# Patient Record
Sex: Female | Born: 1950
Health system: Southern US, Community
[De-identification: ages and names within clinical notes are randomized; demographics above are authoritative.]

## PROBLEM LIST (undated history)

## (undated) DIAGNOSIS — E559 Vitamin D deficiency, unspecified: Secondary | ICD-10-CM

## (undated) DIAGNOSIS — I82409 Acute embolism and thrombosis of unspecified deep veins of unspecified lower extremity: Secondary | ICD-10-CM

## (undated) DIAGNOSIS — M5030 Other cervical disc degeneration, unspecified cervical region: Secondary | ICD-10-CM

## (undated) DIAGNOSIS — I639 Cerebral infarction, unspecified: Secondary | ICD-10-CM

## (undated) DIAGNOSIS — E221 Hyperprolactinemia: Secondary | ICD-10-CM

## (undated) DIAGNOSIS — E079 Disorder of thyroid, unspecified: Secondary | ICD-10-CM

## (undated) DIAGNOSIS — D869 Sarcoidosis, unspecified: Secondary | ICD-10-CM

## (undated) DIAGNOSIS — E2740 Unspecified adrenocortical insufficiency: Secondary | ICD-10-CM

## (undated) DIAGNOSIS — E230 Hypopituitarism: Secondary | ICD-10-CM

## (undated) DIAGNOSIS — Z789 Other specified health status: Secondary | ICD-10-CM

## (undated) DIAGNOSIS — M51369 Other intervertebral disc degeneration, lumbar region without mention of lumbar back pain or lower extremity pain: Secondary | ICD-10-CM

## (undated) DIAGNOSIS — G8190 Hemiplegia, unspecified affecting unspecified side: Secondary | ICD-10-CM

## (undated) DIAGNOSIS — IMO0001 Reserved for inherently not codable concepts without codable children: Secondary | ICD-10-CM

## (undated) DIAGNOSIS — G4733 Obstructive sleep apnea (adult) (pediatric): Secondary | ICD-10-CM

## (undated) DIAGNOSIS — E7800 Pure hypercholesterolemia, unspecified: Secondary | ICD-10-CM

## (undated) DIAGNOSIS — M5136 Other intervertebral disc degeneration, lumbar region: Secondary | ICD-10-CM

## (undated) HISTORY — PX: NOSE SURGERY: SHX723

## (undated) HISTORY — PX: CHOLECYSTECTOMY: SHX55

---

## 2019-06-03 DIAGNOSIS — E2740 Unspecified adrenocortical insufficiency: Secondary | ICD-10-CM | POA: Diagnosis not present

## 2019-06-03 DIAGNOSIS — M8949 Other hypertrophic osteoarthropathy, multiple sites: Secondary | ICD-10-CM | POA: Diagnosis not present

## 2019-06-03 DIAGNOSIS — D869 Sarcoidosis, unspecified: Secondary | ICD-10-CM | POA: Diagnosis not present

## 2019-06-24 DIAGNOSIS — Z20822 Contact with and (suspected) exposure to covid-19: Secondary | ICD-10-CM | POA: Diagnosis not present

## 2019-06-24 DIAGNOSIS — Z743 Need for continuous supervision: Secondary | ICD-10-CM | POA: Diagnosis not present

## 2019-06-24 DIAGNOSIS — I69320 Aphasia following cerebral infarction: Secondary | ICD-10-CM | POA: Diagnosis not present

## 2019-06-24 DIAGNOSIS — I82409 Acute embolism and thrombosis of unspecified deep veins of unspecified lower extremity: Secondary | ICD-10-CM | POA: Diagnosis not present

## 2019-06-24 DIAGNOSIS — Z9181 History of falling: Secondary | ICD-10-CM | POA: Diagnosis not present

## 2019-06-24 DIAGNOSIS — Z7952 Long term (current) use of systemic steroids: Secondary | ICD-10-CM | POA: Diagnosis not present

## 2019-06-24 DIAGNOSIS — E7849 Other hyperlipidemia: Secondary | ICD-10-CM | POA: Diagnosis not present

## 2019-06-24 DIAGNOSIS — R2681 Unsteadiness on feet: Secondary | ICD-10-CM | POA: Diagnosis not present

## 2019-06-24 DIAGNOSIS — E236 Other disorders of pituitary gland: Secondary | ICD-10-CM | POA: Diagnosis not present

## 2019-06-24 DIAGNOSIS — D689 Coagulation defect, unspecified: Secondary | ICD-10-CM | POA: Diagnosis not present

## 2019-06-24 DIAGNOSIS — Z882 Allergy status to sulfonamides status: Secondary | ICD-10-CM | POA: Diagnosis not present

## 2019-06-24 DIAGNOSIS — E230 Hypopituitarism: Secondary | ICD-10-CM | POA: Diagnosis not present

## 2019-06-24 DIAGNOSIS — D869 Sarcoidosis, unspecified: Secondary | ICD-10-CM | POA: Diagnosis not present

## 2019-06-24 DIAGNOSIS — Z91013 Allergy to seafood: Secondary | ICD-10-CM | POA: Diagnosis not present

## 2019-06-24 DIAGNOSIS — E785 Hyperlipidemia, unspecified: Secondary | ICD-10-CM | POA: Diagnosis not present

## 2019-06-24 DIAGNOSIS — R296 Repeated falls: Secondary | ICD-10-CM | POA: Diagnosis not present

## 2019-06-24 DIAGNOSIS — Z886 Allergy status to analgesic agent status: Secondary | ICD-10-CM | POA: Diagnosis not present

## 2019-06-24 DIAGNOSIS — L929 Granulomatous disorder of the skin and subcutaneous tissue, unspecified: Secondary | ICD-10-CM | POA: Diagnosis not present

## 2019-06-24 DIAGNOSIS — D649 Anemia, unspecified: Secondary | ICD-10-CM | POA: Diagnosis not present

## 2019-06-24 DIAGNOSIS — M5489 Other dorsalgia: Secondary | ICD-10-CM | POA: Diagnosis not present

## 2019-06-24 DIAGNOSIS — R29898 Other symptoms and signs involving the musculoskeletal system: Secondary | ICD-10-CM | POA: Diagnosis not present

## 2019-06-24 DIAGNOSIS — E039 Hypothyroidism, unspecified: Secondary | ICD-10-CM | POA: Diagnosis not present

## 2019-06-24 DIAGNOSIS — M4807 Spinal stenosis, lumbosacral region: Secondary | ICD-10-CM | POA: Diagnosis not present

## 2019-06-24 DIAGNOSIS — E038 Other specified hypothyroidism: Secondary | ICD-10-CM | POA: Diagnosis not present

## 2019-06-24 DIAGNOSIS — M6281 Muscle weakness (generalized): Secondary | ICD-10-CM | POA: Diagnosis not present

## 2019-06-24 DIAGNOSIS — Z86711 Personal history of pulmonary embolism: Secondary | ICD-10-CM | POA: Diagnosis not present

## 2019-06-24 DIAGNOSIS — G4733 Obstructive sleep apnea (adult) (pediatric): Secondary | ICD-10-CM | POA: Diagnosis not present

## 2019-06-24 DIAGNOSIS — I63512 Cerebral infarction due to unspecified occlusion or stenosis of left middle cerebral artery: Secondary | ICD-10-CM | POA: Diagnosis not present

## 2019-06-24 DIAGNOSIS — R627 Adult failure to thrive: Secondary | ICD-10-CM | POA: Diagnosis not present

## 2019-06-24 DIAGNOSIS — Z95828 Presence of other vascular implants and grafts: Secondary | ICD-10-CM | POA: Diagnosis not present

## 2019-06-24 DIAGNOSIS — Z8673 Personal history of transient ischemic attack (TIA), and cerebral infarction without residual deficits: Secondary | ICD-10-CM | POA: Diagnosis not present

## 2019-06-24 DIAGNOSIS — I82891 Chronic embolism and thrombosis of other specified veins: Secondary | ICD-10-CM | POA: Diagnosis not present

## 2019-06-24 DIAGNOSIS — R262 Difficulty in walking, not elsewhere classified: Secondary | ICD-10-CM | POA: Diagnosis not present

## 2019-06-24 DIAGNOSIS — I69351 Hemiplegia and hemiparesis following cerebral infarction affecting right dominant side: Secondary | ICD-10-CM | POA: Diagnosis not present

## 2019-06-24 DIAGNOSIS — D8689 Sarcoidosis of other sites: Secondary | ICD-10-CM | POA: Diagnosis not present

## 2019-06-24 DIAGNOSIS — Z7901 Long term (current) use of anticoagulants: Secondary | ICD-10-CM | POA: Diagnosis not present

## 2019-06-24 DIAGNOSIS — Z531 Procedure and treatment not carried out because of patient's decision for reasons of belief and group pressure: Secondary | ICD-10-CM | POA: Diagnosis not present

## 2019-06-24 DIAGNOSIS — G8929 Other chronic pain: Secondary | ICD-10-CM | POA: Diagnosis not present

## 2019-06-24 DIAGNOSIS — N3090 Cystitis, unspecified without hematuria: Secondary | ICD-10-CM | POA: Diagnosis not present

## 2019-06-24 DIAGNOSIS — M5000 Cervical disc disorder with myelopathy, unspecified cervical region: Secondary | ICD-10-CM | POA: Diagnosis not present

## 2019-06-24 DIAGNOSIS — M5002 Cervical disc disorder with myelopathy, mid-cervical region, unspecified level: Secondary | ICD-10-CM | POA: Diagnosis not present

## 2019-06-24 DIAGNOSIS — Z86718 Personal history of other venous thrombosis and embolism: Secondary | ICD-10-CM | POA: Diagnosis not present

## 2019-06-24 DIAGNOSIS — M8580 Other specified disorders of bone density and structure, unspecified site: Secondary | ICD-10-CM | POA: Diagnosis not present

## 2019-06-24 DIAGNOSIS — E559 Vitamin D deficiency, unspecified: Secondary | ICD-10-CM | POA: Diagnosis not present

## 2019-06-24 DIAGNOSIS — M4316 Spondylolisthesis, lumbar region: Secondary | ICD-10-CM | POA: Diagnosis not present

## 2019-06-24 DIAGNOSIS — R531 Weakness: Secondary | ICD-10-CM | POA: Diagnosis not present

## 2019-06-24 DIAGNOSIS — I69354 Hemiplegia and hemiparesis following cerebral infarction affecting left non-dominant side: Secondary | ICD-10-CM | POA: Diagnosis not present

## 2019-06-24 DIAGNOSIS — G2581 Restless legs syndrome: Secondary | ICD-10-CM | POA: Diagnosis not present

## 2019-06-24 DIAGNOSIS — E2740 Unspecified adrenocortical insufficiency: Secondary | ICD-10-CM | POA: Diagnosis not present

## 2019-06-24 DIAGNOSIS — I671 Cerebral aneurysm, nonruptured: Secondary | ICD-10-CM | POA: Diagnosis not present

## 2019-06-24 DIAGNOSIS — R8281 Pyuria: Secondary | ICD-10-CM | POA: Diagnosis not present

## 2019-06-24 DIAGNOSIS — M79605 Pain in left leg: Secondary | ICD-10-CM | POA: Diagnosis not present

## 2019-06-24 DIAGNOSIS — M48061 Spinal stenosis, lumbar region without neurogenic claudication: Secondary | ICD-10-CM | POA: Diagnosis not present

## 2019-06-24 DIAGNOSIS — N390 Urinary tract infection, site not specified: Secondary | ICD-10-CM | POA: Diagnosis not present

## 2019-06-24 DIAGNOSIS — D509 Iron deficiency anemia, unspecified: Secondary | ICD-10-CM | POA: Diagnosis not present

## 2019-07-03 DIAGNOSIS — E7849 Other hyperlipidemia: Secondary | ICD-10-CM | POA: Diagnosis not present

## 2019-07-03 DIAGNOSIS — I63512 Cerebral infarction due to unspecified occlusion or stenosis of left middle cerebral artery: Secondary | ICD-10-CM | POA: Diagnosis not present

## 2019-07-03 DIAGNOSIS — E230 Hypopituitarism: Secondary | ICD-10-CM | POA: Diagnosis not present

## 2019-07-03 DIAGNOSIS — R29898 Other symptoms and signs involving the musculoskeletal system: Secondary | ICD-10-CM | POA: Diagnosis not present

## 2019-07-03 DIAGNOSIS — R2681 Unsteadiness on feet: Secondary | ICD-10-CM | POA: Diagnosis not present

## 2019-07-03 DIAGNOSIS — R531 Weakness: Secondary | ICD-10-CM | POA: Diagnosis not present

## 2019-07-03 DIAGNOSIS — N390 Urinary tract infection, site not specified: Secondary | ICD-10-CM | POA: Diagnosis not present

## 2019-07-03 DIAGNOSIS — Z8673 Personal history of transient ischemic attack (TIA), and cerebral infarction without residual deficits: Secondary | ICD-10-CM | POA: Diagnosis not present

## 2019-07-03 DIAGNOSIS — E236 Other disorders of pituitary gland: Secondary | ICD-10-CM | POA: Diagnosis not present

## 2019-07-03 DIAGNOSIS — I69354 Hemiplegia and hemiparesis following cerebral infarction affecting left non-dominant side: Secondary | ICD-10-CM | POA: Diagnosis not present

## 2019-07-03 DIAGNOSIS — Z79899 Other long term (current) drug therapy: Secondary | ICD-10-CM | POA: Diagnosis not present

## 2019-07-03 DIAGNOSIS — R319 Hematuria, unspecified: Secondary | ICD-10-CM | POA: Diagnosis not present

## 2019-07-03 DIAGNOSIS — E038 Other specified hypothyroidism: Secondary | ICD-10-CM | POA: Diagnosis not present

## 2019-07-03 DIAGNOSIS — L929 Granulomatous disorder of the skin and subcutaneous tissue, unspecified: Secondary | ICD-10-CM | POA: Diagnosis not present

## 2019-07-03 DIAGNOSIS — M48061 Spinal stenosis, lumbar region without neurogenic claudication: Secondary | ICD-10-CM | POA: Diagnosis not present

## 2019-07-03 DIAGNOSIS — Z531 Procedure and treatment not carried out because of patient's decision for reasons of belief and group pressure: Secondary | ICD-10-CM | POA: Diagnosis not present

## 2019-07-03 DIAGNOSIS — M4807 Spinal stenosis, lumbosacral region: Secondary | ICD-10-CM | POA: Diagnosis not present

## 2019-07-03 DIAGNOSIS — I82891 Chronic embolism and thrombosis of other specified veins: Secondary | ICD-10-CM | POA: Diagnosis not present

## 2019-07-03 DIAGNOSIS — E2740 Unspecified adrenocortical insufficiency: Secondary | ICD-10-CM | POA: Diagnosis not present

## 2019-07-03 DIAGNOSIS — M4316 Spondylolisthesis, lumbar region: Secondary | ICD-10-CM | POA: Diagnosis not present

## 2019-07-03 DIAGNOSIS — Z95828 Presence of other vascular implants and grafts: Secondary | ICD-10-CM | POA: Diagnosis not present

## 2019-07-03 DIAGNOSIS — R262 Difficulty in walking, not elsewhere classified: Secondary | ICD-10-CM | POA: Diagnosis not present

## 2019-07-03 DIAGNOSIS — R8281 Pyuria: Secondary | ICD-10-CM | POA: Diagnosis not present

## 2019-07-03 DIAGNOSIS — M6281 Muscle weakness (generalized): Secondary | ICD-10-CM | POA: Diagnosis not present

## 2019-07-03 DIAGNOSIS — I69320 Aphasia following cerebral infarction: Secondary | ICD-10-CM | POA: Diagnosis not present

## 2019-07-03 DIAGNOSIS — M79605 Pain in left leg: Secondary | ICD-10-CM | POA: Diagnosis not present

## 2019-07-03 DIAGNOSIS — G2581 Restless legs syndrome: Secondary | ICD-10-CM | POA: Diagnosis not present

## 2019-07-03 DIAGNOSIS — D869 Sarcoidosis, unspecified: Secondary | ICD-10-CM | POA: Diagnosis not present

## 2019-07-03 DIAGNOSIS — M5000 Cervical disc disorder with myelopathy, unspecified cervical region: Secondary | ICD-10-CM | POA: Diagnosis not present

## 2019-07-03 DIAGNOSIS — Z9181 History of falling: Secondary | ICD-10-CM | POA: Diagnosis not present

## 2019-07-03 DIAGNOSIS — Z86711 Personal history of pulmonary embolism: Secondary | ICD-10-CM | POA: Diagnosis not present

## 2019-07-03 DIAGNOSIS — D649 Anemia, unspecified: Secondary | ICD-10-CM | POA: Diagnosis not present

## 2019-07-04 DIAGNOSIS — E038 Other specified hypothyroidism: Secondary | ICD-10-CM | POA: Diagnosis not present

## 2019-07-04 DIAGNOSIS — R29898 Other symptoms and signs involving the musculoskeletal system: Secondary | ICD-10-CM | POA: Diagnosis not present

## 2019-07-04 DIAGNOSIS — M4316 Spondylolisthesis, lumbar region: Secondary | ICD-10-CM | POA: Diagnosis not present

## 2019-07-09 DIAGNOSIS — R531 Weakness: Secondary | ICD-10-CM | POA: Diagnosis not present

## 2019-07-09 DIAGNOSIS — I69354 Hemiplegia and hemiparesis following cerebral infarction affecting left non-dominant side: Secondary | ICD-10-CM | POA: Diagnosis not present

## 2019-07-25 DIAGNOSIS — G2581 Restless legs syndrome: Secondary | ICD-10-CM | POA: Diagnosis not present

## 2019-07-25 DIAGNOSIS — I69320 Aphasia following cerebral infarction: Secondary | ICD-10-CM | POA: Diagnosis not present

## 2019-07-25 DIAGNOSIS — R531 Weakness: Secondary | ICD-10-CM | POA: Diagnosis not present

## 2019-07-28 ENCOUNTER — Other Ambulatory Visit: Payer: Self-pay

## 2019-07-28 ENCOUNTER — Emergency Department
Admission: EM | Admit: 2019-07-28 | Discharge: 2019-07-28 | Disposition: A | Discharge status: Home / Self Care | Payer: Medicare Other | Attending: Emergency Medicine | Admitting: Emergency Medicine

## 2019-07-28 ENCOUNTER — Encounter: Payer: Self-pay | Admitting: Emergency Medicine

## 2019-07-28 DIAGNOSIS — E038 Other specified hypothyroidism: Secondary | ICD-10-CM | POA: Diagnosis not present

## 2019-07-28 DIAGNOSIS — M4316 Spondylolisthesis, lumbar region: Secondary | ICD-10-CM | POA: Diagnosis not present

## 2019-07-28 DIAGNOSIS — R300 Dysuria: Secondary | ICD-10-CM | POA: Insufficient documentation

## 2019-07-28 DIAGNOSIS — Z8673 Personal history of transient ischemic attack (TIA), and cerebral infarction without residual deficits: Secondary | ICD-10-CM | POA: Diagnosis not present

## 2019-07-28 DIAGNOSIS — I69351 Hemiplegia and hemiparesis following cerebral infarction affecting right dominant side: Secondary | ICD-10-CM | POA: Diagnosis not present

## 2019-07-28 DIAGNOSIS — M4807 Spinal stenosis, lumbosacral region: Secondary | ICD-10-CM | POA: Diagnosis not present

## 2019-07-28 DIAGNOSIS — R319 Hematuria, unspecified: Secondary | ICD-10-CM | POA: Diagnosis not present

## 2019-07-28 DIAGNOSIS — Z9181 History of falling: Secondary | ICD-10-CM | POA: Diagnosis not present

## 2019-07-28 DIAGNOSIS — R1030 Lower abdominal pain, unspecified: Secondary | ICD-10-CM | POA: Diagnosis present

## 2019-07-28 DIAGNOSIS — N390 Urinary tract infection, site not specified: Secondary | ICD-10-CM | POA: Insufficient documentation

## 2019-07-28 DIAGNOSIS — Z86711 Personal history of pulmonary embolism: Secondary | ICD-10-CM | POA: Diagnosis not present

## 2019-07-28 DIAGNOSIS — D8689 Sarcoidosis of other sites: Secondary | ICD-10-CM | POA: Diagnosis not present

## 2019-07-28 DIAGNOSIS — M5000 Cervical disc disorder with myelopathy, unspecified cervical region: Secondary | ICD-10-CM | POA: Diagnosis not present

## 2019-07-28 DIAGNOSIS — G2581 Restless legs syndrome: Secondary | ICD-10-CM | POA: Diagnosis not present

## 2019-07-28 DIAGNOSIS — Z7901 Long term (current) use of anticoagulants: Secondary | ICD-10-CM | POA: Diagnosis not present

## 2019-07-28 DIAGNOSIS — I69320 Aphasia following cerebral infarction: Secondary | ICD-10-CM | POA: Diagnosis not present

## 2019-07-28 DIAGNOSIS — E785 Hyperlipidemia, unspecified: Secondary | ICD-10-CM | POA: Diagnosis not present

## 2019-07-28 HISTORY — DX: Sarcoidosis, unspecified: D86.9

## 2019-07-28 HISTORY — DX: Vitamin D deficiency, unspecified: E55.9

## 2019-07-28 HISTORY — DX: Cerebral infarction, unspecified: I63.9

## 2019-07-28 HISTORY — DX: Disorder of thyroid, unspecified: E07.9

## 2019-07-28 HISTORY — DX: Obstructive sleep apnea (adult) (pediatric): G47.33

## 2019-07-28 HISTORY — DX: Reserved for inherently not codable concepts without codable children: IMO0001

## 2019-07-28 HISTORY — DX: Hemiplegia, unspecified affecting unspecified side: G81.90

## 2019-07-28 HISTORY — DX: Other intervertebral disc degeneration, lumbar region without mention of lumbar back pain or lower extremity pain: M51.369

## 2019-07-28 HISTORY — DX: Pure hypercholesterolemia, unspecified: E78.00

## 2019-07-28 HISTORY — DX: Hypopituitarism: E23.0

## 2019-07-28 HISTORY — DX: Other cervical disc degeneration, unspecified cervical region: M50.30

## 2019-07-28 HISTORY — DX: Other intervertebral disc degeneration, lumbar region: M51.36

## 2019-07-28 HISTORY — DX: Acute embolism and thrombosis of unspecified deep veins of unspecified lower extremity: I82.409

## 2019-07-28 HISTORY — DX: Other specified health status: Z78.9

## 2019-07-28 HISTORY — DX: Hyperprolactinemia: E22.1

## 2019-07-28 HISTORY — DX: Unspecified adrenocortical insufficiency: E27.40

## 2019-07-28 LAB — CBC
HCT: 41.5 % (ref 36.0–46.0)
Hemoglobin: 13 g/dL (ref 12.0–15.0)
MCH: 27.8 pg (ref 26.0–34.0)
MCHC: 31.3 g/dL (ref 30.0–36.0)
MCV: 88.7 fL (ref 80.0–100.0)
Platelets: 302 10*3/uL (ref 150–400)
RBC: 4.68 MIL/uL (ref 3.87–5.11)
RDW: 15.9 % — ABNORMAL HIGH (ref 11.5–15.5)
WBC: 6.1 10*3/uL (ref 4.0–10.5)
nRBC: 0 % (ref 0.0–0.2)

## 2019-07-28 LAB — COMPREHENSIVE METABOLIC PANEL
ALT: 25 U/L (ref 0–44)
AST: 24 U/L (ref 15–41)
Albumin: 4 g/dL (ref 3.5–5.0)
Alkaline Phosphatase: 80 U/L (ref 38–126)
Anion gap: 9 (ref 5–15)
BUN: 13 mg/dL (ref 8–23)
CO2: 25 mmol/L (ref 22–32)
Calcium: 9 mg/dL (ref 8.9–10.3)
Chloride: 105 mmol/L (ref 98–111)
Creatinine, Ser: 1.1 mg/dL — ABNORMAL HIGH (ref 0.44–1.00)
GFR calc Af Amer: >60 mL/min (ref >60)
GFR calc non Af Amer: 52 mL/min — ABNORMAL LOW (ref >60)
Glucose, Bld: 92 mg/dL (ref 70–99)
Potassium: 3.7 mmol/L (ref 3.5–5.1)
Sodium: 139 mmol/L (ref 135–145)
Total Bilirubin: 0.6 mg/dL (ref 0.3–1.2)
Total Protein: 7 g/dL (ref 6.5–8.1)

## 2019-07-28 LAB — URINALYSIS, COMPLETE (UACMP) WITH MICROSCOPIC
Bilirubin Urine: NEGATIVE
Glucose, UA: NEGATIVE mg/dL
Ketones, ur: NEGATIVE mg/dL
Nitrite: POSITIVE — AB
Protein, ur: 100 mg/dL — AB
RBC / HPF: >50 RBC/hpf (ref 0–5)
Specific Gravity, Urine: 1.015 (ref 1.005–1.030)
WBC, UA: >50 WBC/hpf (ref 0–5)
pH: 5.5 (ref 5.0–8.0)

## 2019-07-28 LAB — LIPASE, BLOOD: Lipase: 25 U/L (ref 11–51)

## 2019-07-28 MED ORDER — SODIUM CHLORIDE 0.9 % IV SOLN
1.0000 g | Freq: Once | INTRAVENOUS | Status: AC
  Administered 2019-07-28: 1 g via INTRAVENOUS
  Filled 2019-07-28: qty 10

## 2019-07-28 MED ORDER — CEFDINIR 300 MG PO CAPS
300.0000 mg | ORAL_CAPSULE | Freq: Two times a day (BID) | ORAL | 0 refills | Status: DC
Start: 2019-07-28 — End: 2019-08-01

## 2019-07-28 NOTE — ED Provider Notes (Signed)
[facility] Emergency Department Provider Note  ____________________________________________   First MD Initiated Contact with Patient 07/28/19 1252     (approximate)  I have reviewed the triage vital signs and the nursing notes.   HISTORY  Chief Complaint Abdominal Pain    HPI Morgan Carpenter is a 69 y.o. female presents to the emergency department with her husband.  She is complaining of lower abdominal pain and burning with urination.  Patient states that she has had UTI symptoms for greater than 5 days.  She recently had a stroke and has been going to rehab.  They did obtain a urine but lost the urine specimen.  He took another specimen yesterday but no results at this time.  She states she started to feel worse and her husband brought her to the ED.  She denies fever but is taking Tylenol.  No vomiting.  No back pain    Past Medical History:  Diagnosis Date  . Adrenal insufficiency (HCC)   . DDD (degenerative disc disease), cervical   . DDD (degenerative disc disease), lumbar   . Hemiplegia (HCC)   . Hypercholesteremia   . Hyperprolactinemia (HCC)   . Hypopituitarism (HCC)   . Lymphocytic hypophysitis (HCC)   . OSA (obstructive sleep apnea)   . Patient is Jehovah's Witness   . Recurrent deep vein thrombosis (DVT) (HCC)   . Sarcoidosis   . Stroke (HCC)   . Thyroid disease   . Vitamin D deficiency     There are no problems to display for this patient.   Past Surgical History:  Procedure Laterality Date  . CHOLECYSTECTOMY    . NOSE SURGERY      Prior to Admission medications   Medication Sig Start Date End Date Taking? Authorizing Provider  cefdinir (OMNICEF) 300 MG capsule Take 1 capsule (300 mg total) by mouth 2 (two) times daily. 07/28/19   Faythe Ghee, PA-C    Allergies Naprosyn [naproxen], Shellfish allergy, and Sulfa antibiotics  History reviewed. No pertinent family history.  Social History Social History   Tobacco Use   . Smoking status: Never Smoker  . Smokeless tobacco: Never Used  Substance Use Topics  . Alcohol use: Never  . Drug use: Never    Review of Systems  Constitutional: No fever/chills Eyes: No visual changes. ENT: No sore throat. Respiratory: Denies cough Cardiovascular: Denies chest pain Gastrointestinal: Positive abdominal pain Genitourinary: Positive for dysuria. Musculoskeletal: Negative for back pain. Skin: Negative for rash. Psychiatric: no mood changes,     ____________________________________________   PHYSICAL EXAM:  VITAL SIGNS: ED Triage Vitals  Enc Vitals Group     BP 07/28/19 1134 116/70     Pulse Rate 07/28/19 1134 90     Resp 07/28/19 1134 18     Temp 07/28/19 1134 98.3 F (36.8 C)     Temp Source 07/28/19 1134 Oral     SpO2 07/28/19 1134 100 %     Weight 07/28/19 1134 175 lb (79.4 kg)     Height 07/28/19 1134 5\' 4"  (1.626 m)     Head Circumference --      Peak Flow --      Pain Score 07/28/19 1133 6     Pain Loc --      Pain Edu? --      Excl. in GC? --     Constitutional: Alert and oriented. Well appearing and in no acute distress. Eyes: Conjunctivae are normal.  Head: Atraumatic. Nose: No  congestion/rhinnorhea. Mouth/Throat: Mucous membranes are moist.   Neck:  supple no lymphadenopathy noted Cardiovascular: Normal rate, regular rhythm. Heart sounds are normal Respiratory: Normal respiratory effort.  No retractions, lungs c t a  Abd: soft mildly tender at the suprapubic bs normal all 4 quad GU: deferred Musculoskeletal: Able to move arms and hands, warm and well perfused Neurologic:  Normal speech and language at baseline for patient Skin:  Skin is warm, dry and intact. No rash noted. Psychiatric: Mood and affect are normal. Speech and behavior are normal.  ____________________________________________   LABS (all labs ordered are listed, but only abnormal results are displayed)  Labs Reviewed  COMPREHENSIVE METABOLIC PANEL -  Abnormal; Notable for the following components:      Result Value   Creatinine, Ser 1.10 (*)    GFR calc non Af Amer 52 (*)    All other components within normal limits  CBC - Abnormal; Notable for the following components:   RDW 15.9 (*)    All other components within normal limits  URINALYSIS, COMPLETE (UACMP) WITH MICROSCOPIC - Abnormal; Notable for the following components:   APPearance CLOUDY (*)    Hgb urine dipstick LARGE (*)    Protein, ur 100 (*)    Nitrite POSITIVE (*)    Leukocytes,Ua LARGE (*)    Bacteria, UA MANY (*)    All other components within normal limits  URINE CULTURE  LIPASE, BLOOD   ____________________________________________   ____________________________________________  RADIOLOGY    ____________________________________________   PROCEDURES  Procedure(s) performed: Saline lock, Rocephin 1 g IV   Procedures    ____________________________________________   INITIAL IMPRESSION / ASSESSMENT AND PLAN / ED COURSE  Pertinent labs & imaging results that were available during my care of the patient were reviewed by me and considered in my medical decision making (see chart for details).   Patient is 69 year old female with history of stroke presents emergency department for UTI symptoms.  See HPI  Physical exam shows patient to appear stable.  Abdomen is minimally tender at the suprapubic area.  Patient appears to be at baseline  DDx: UTI, pyelonephritis, sepsis  CBC is normal, comprehensive metabolic panel is basically normal, UA has large amount hemoglobin, positive nitrates large leuks and many bacteria greater than 50 WBCs and greater than 50 RBCs with white blood cell clumps present.  Lipase is normal urine culture is pending  Labs are reassuring that the patient is not septic or have pyelonephritis.  Feel this is a lower urinary tract infection.  Patient is to follow-up with her regular doctor.  She was given Rocephin 1 g IV while here in  the ED.  Culture was ordered to make sure is not resistant to the medications.  She was given a prescription for Omnicef twice daily for 7 days.  She is to return the hospital if worsening.  I did explain signs and symptoms of sepsis and pyelonephritis to her husband.  He states he understands.  Patient was discharged into his care in stable condition.    Morgan Carpenter was evaluated in Emergency Department on 07/28/2019 for the symptoms described in the history of present illness. She was evaluated in the context of the global COVID-19 pandemic, which necessitated consideration that the patient might be at risk for infection with the SARS-CoV-2 virus that causes COVID-19. Institutional protocols and algorithms that pertain to the evaluation of patients at risk for COVID-19 are in a state of rapid change based on information released by regulatory  bodies including the CDC and federal and state organizations. These policies and algorithms were followed during the patient's care in the ED.   As part of my medical decision making, I reviewed the following data within the electronic MEDICAL RECORD NUMBER History obtained from family, Nursing notes reviewed and incorporated, Labs reviewed , Old chart reviewed, Notes from prior ED visits and [place] Controlled Substance Database  ____________________________________________   FINAL CLINICAL IMPRESSION(S) / ED DIAGNOSES  Final diagnoses:  Urinary tract infection with hematuria, site unspecified      NEW MEDICATIONS STARTED DURING THIS VISIT:  Discharge Medication List as of 07/28/2019  1:42 PM    START taking these medications   Details  cefdinir (OMNICEF) 300 MG capsule Take 1 capsule (300 mg total) by mouth 2 (two) times daily., Starting Sun 07/28/2019, Normal         Note:  This document was prepared using Dragon voice recognition software and may include unintentional dictation errors.    Faythe Ghee, PA-C 07/28/19 1527    Dionne Bucy, MD 07/28/19 1623

## 2019-07-28 NOTE — Discharge Instructions (Addendum)
Follow-up with your regular doctor if not improving in 4 to 5 days.  Return emergency department worsening.  Take medication as prescribed.  We will call you if the culture shows resistant bacteria.

## 2019-07-28 NOTE — ED Notes (Signed)
Pt assisted to toilet in lobby and urine sample sent to lab

## 2019-07-28 NOTE — ED Triage Notes (Signed)
Pt here for lower abdominal burning and burning with urination.  Reports burning in lower abdomen even when not urinating.  She thinks she has a uti.  Pt has difficulty with speech but normal since her stroke per pt.  Denies NVD, fevers. VSS

## 2019-07-29 DIAGNOSIS — R7303 Prediabetes: Secondary | ICD-10-CM | POA: Diagnosis not present

## 2019-07-29 DIAGNOSIS — Z79899 Other long term (current) drug therapy: Secondary | ICD-10-CM | POA: Diagnosis not present

## 2019-07-29 DIAGNOSIS — E2740 Unspecified adrenocortical insufficiency: Secondary | ICD-10-CM | POA: Diagnosis not present

## 2019-07-29 DIAGNOSIS — E230 Hypopituitarism: Secondary | ICD-10-CM | POA: Diagnosis not present

## 2019-07-29 DIAGNOSIS — E039 Hypothyroidism, unspecified: Secondary | ICD-10-CM | POA: Diagnosis not present

## 2019-07-30 DIAGNOSIS — G4733 Obstructive sleep apnea (adult) (pediatric): Secondary | ICD-10-CM | POA: Diagnosis not present

## 2019-07-30 LAB — URINE CULTURE: Culture: >=100000 — AB

## 2019-08-01 ENCOUNTER — Emergency Department: Payer: Medicare Other

## 2019-08-01 ENCOUNTER — Inpatient Hospital Stay
Admission: EM | Admit: 2019-08-01 | Discharge: 2019-08-09 | DRG: 690 | Disposition: A | Discharge status: Skilled Nursing Facility | Payer: Medicare Other | Attending: Internal Medicine | Admitting: Internal Medicine

## 2019-08-01 ENCOUNTER — Other Ambulatory Visit: Payer: Self-pay

## 2019-08-01 DIAGNOSIS — E2749 Other adrenocortical insufficiency: Secondary | ICD-10-CM | POA: Diagnosis not present

## 2019-08-01 DIAGNOSIS — E7800 Pure hypercholesterolemia, unspecified: Secondary | ICD-10-CM | POA: Diagnosis not present

## 2019-08-01 DIAGNOSIS — Z91013 Allergy to seafood: Secondary | ICD-10-CM | POA: Diagnosis not present

## 2019-08-01 DIAGNOSIS — Z95828 Presence of other vascular implants and grafts: Secondary | ICD-10-CM

## 2019-08-01 DIAGNOSIS — R319 Hematuria, unspecified: Secondary | ICD-10-CM | POA: Diagnosis not present

## 2019-08-01 DIAGNOSIS — F329 Major depressive disorder, single episode, unspecified: Secondary | ICD-10-CM | POA: Diagnosis present

## 2019-08-01 DIAGNOSIS — K5900 Constipation, unspecified: Secondary | ICD-10-CM | POA: Diagnosis not present

## 2019-08-01 DIAGNOSIS — Z79899 Other long term (current) drug therapy: Secondary | ICD-10-CM | POA: Diagnosis not present

## 2019-08-01 DIAGNOSIS — Z743 Need for continuous supervision: Secondary | ICD-10-CM | POA: Diagnosis not present

## 2019-08-01 DIAGNOSIS — E559 Vitamin D deficiency, unspecified: Secondary | ICD-10-CM | POA: Diagnosis present

## 2019-08-01 DIAGNOSIS — R7401 Elevation of levels of liver transaminase levels: Secondary | ICD-10-CM | POA: Diagnosis not present

## 2019-08-01 DIAGNOSIS — Y92009 Unspecified place in unspecified non-institutional (private) residence as the place of occurrence of the external cause: Secondary | ICD-10-CM | POA: Diagnosis not present

## 2019-08-01 DIAGNOSIS — Z886 Allergy status to analgesic agent status: Secondary | ICD-10-CM | POA: Diagnosis not present

## 2019-08-01 DIAGNOSIS — E039 Hypothyroidism, unspecified: Secondary | ICD-10-CM | POA: Diagnosis present

## 2019-08-01 DIAGNOSIS — R509 Fever, unspecified: Secondary | ICD-10-CM | POA: Diagnosis not present

## 2019-08-01 DIAGNOSIS — I69359 Hemiplegia and hemiparesis following cerebral infarction affecting unspecified side: Secondary | ICD-10-CM

## 2019-08-01 DIAGNOSIS — Z86711 Personal history of pulmonary embolism: Secondary | ICD-10-CM

## 2019-08-01 DIAGNOSIS — Z86718 Personal history of other venous thrombosis and embolism: Secondary | ICD-10-CM

## 2019-08-01 DIAGNOSIS — Z79890 Hormone replacement therapy (postmenopausal): Secondary | ICD-10-CM

## 2019-08-01 DIAGNOSIS — E079 Disorder of thyroid, unspecified: Secondary | ICD-10-CM | POA: Diagnosis not present

## 2019-08-01 DIAGNOSIS — D8689 Sarcoidosis of other sites: Secondary | ICD-10-CM | POA: Diagnosis present

## 2019-08-01 DIAGNOSIS — W19XXXA Unspecified fall, initial encounter: Secondary | ICD-10-CM | POA: Diagnosis present

## 2019-08-01 DIAGNOSIS — Z20822 Contact with and (suspected) exposure to covid-19: Secondary | ICD-10-CM | POA: Diagnosis not present

## 2019-08-01 DIAGNOSIS — M25562 Pain in left knee: Secondary | ICD-10-CM | POA: Diagnosis not present

## 2019-08-01 DIAGNOSIS — K760 Fatty (change of) liver, not elsewhere classified: Secondary | ICD-10-CM | POA: Diagnosis not present

## 2019-08-01 DIAGNOSIS — G4733 Obstructive sleep apnea (adult) (pediatric): Secondary | ICD-10-CM | POA: Diagnosis not present

## 2019-08-01 DIAGNOSIS — E221 Hyperprolactinemia: Secondary | ICD-10-CM | POA: Diagnosis not present

## 2019-08-01 DIAGNOSIS — G9340 Encephalopathy, unspecified: Secondary | ICD-10-CM | POA: Diagnosis not present

## 2019-08-01 DIAGNOSIS — M48061 Spinal stenosis, lumbar region without neurogenic claudication: Secondary | ICD-10-CM | POA: Diagnosis present

## 2019-08-01 DIAGNOSIS — E230 Hypopituitarism: Secondary | ICD-10-CM | POA: Diagnosis present

## 2019-08-01 DIAGNOSIS — Z882 Allergy status to sulfonamides status: Secondary | ICD-10-CM | POA: Diagnosis not present

## 2019-08-01 DIAGNOSIS — E272 Addisonian crisis: Secondary | ICD-10-CM | POA: Diagnosis not present

## 2019-08-01 DIAGNOSIS — M5030 Other cervical disc degeneration, unspecified cervical region: Secondary | ICD-10-CM | POA: Diagnosis not present

## 2019-08-01 DIAGNOSIS — M170 Bilateral primary osteoarthritis of knee: Secondary | ICD-10-CM | POA: Diagnosis present

## 2019-08-01 DIAGNOSIS — N390 Urinary tract infection, site not specified: Secondary | ICD-10-CM | POA: Diagnosis not present

## 2019-08-01 DIAGNOSIS — G959 Disease of spinal cord, unspecified: Secondary | ICD-10-CM | POA: Diagnosis present

## 2019-08-01 DIAGNOSIS — M5136 Other intervertebral disc degeneration, lumbar region: Secondary | ICD-10-CM | POA: Diagnosis not present

## 2019-08-01 DIAGNOSIS — M25561 Pain in right knee: Secondary | ICD-10-CM

## 2019-08-01 DIAGNOSIS — D869 Sarcoidosis, unspecified: Secondary | ICD-10-CM

## 2019-08-01 DIAGNOSIS — M25569 Pain in unspecified knee: Secondary | ICD-10-CM

## 2019-08-01 DIAGNOSIS — Z0389 Encounter for observation for other suspected diseases and conditions ruled out: Secondary | ICD-10-CM | POA: Diagnosis not present

## 2019-08-01 DIAGNOSIS — R531 Weakness: Secondary | ICD-10-CM | POA: Diagnosis not present

## 2019-08-01 DIAGNOSIS — N3000 Acute cystitis without hematuria: Secondary | ICD-10-CM | POA: Diagnosis not present

## 2019-08-01 DIAGNOSIS — M1710 Unilateral primary osteoarthritis, unspecified knee: Secondary | ICD-10-CM | POA: Diagnosis present

## 2019-08-01 DIAGNOSIS — R52 Pain, unspecified: Secondary | ICD-10-CM | POA: Diagnosis not present

## 2019-08-01 DIAGNOSIS — M2550 Pain in unspecified joint: Secondary | ICD-10-CM | POA: Diagnosis not present

## 2019-08-01 DIAGNOSIS — M4716 Other spondylosis with myelopathy, lumbar region: Secondary | ICD-10-CM | POA: Diagnosis present

## 2019-08-01 DIAGNOSIS — R5381 Other malaise: Secondary | ICD-10-CM | POA: Diagnosis not present

## 2019-08-01 DIAGNOSIS — M5127 Other intervertebral disc displacement, lumbosacral region: Secondary | ICD-10-CM | POA: Diagnosis not present

## 2019-08-01 DIAGNOSIS — M179 Osteoarthritis of knee, unspecified: Secondary | ICD-10-CM | POA: Diagnosis present

## 2019-08-01 DIAGNOSIS — I82409 Acute embolism and thrombosis of unspecified deep veins of unspecified lower extremity: Secondary | ICD-10-CM

## 2019-08-01 DIAGNOSIS — Z7401 Bed confinement status: Secondary | ICD-10-CM | POA: Diagnosis not present

## 2019-08-01 DIAGNOSIS — A4181 Sepsis due to Enterococcus: Secondary | ICD-10-CM | POA: Diagnosis not present

## 2019-08-01 LAB — URINALYSIS, COMPLETE (UACMP) WITH MICROSCOPIC
RBC / HPF: >50 RBC/hpf (ref 0–5)
Specific Gravity, Urine: 1.03 (ref 1.005–1.030)
WBC, UA: >50 WBC/hpf (ref 0–5)

## 2019-08-01 LAB — COMPREHENSIVE METABOLIC PANEL
ALT: 77 U/L — ABNORMAL HIGH (ref 0–44)
AST: 76 U/L — ABNORMAL HIGH (ref 15–41)
Albumin: 3.7 g/dL (ref 3.5–5.0)
Alkaline Phosphatase: 75 U/L (ref 38–126)
Anion gap: 9 (ref 5–15)
BUN: 11 mg/dL (ref 8–23)
CO2: 22 mmol/L (ref 22–32)
Calcium: 8.8 mg/dL — ABNORMAL LOW (ref 8.9–10.3)
Chloride: 106 mmol/L (ref 98–111)
Creatinine, Ser: 0.99 mg/dL (ref 0.44–1.00)
GFR calc Af Amer: >60 mL/min (ref >60)
GFR calc non Af Amer: 59 mL/min — ABNORMAL LOW (ref >60)
Glucose, Bld: 101 mg/dL — ABNORMAL HIGH (ref 70–99)
Potassium: 3.6 mmol/L (ref 3.5–5.1)
Sodium: 137 mmol/L (ref 135–145)
Total Bilirubin: 0.6 mg/dL (ref 0.3–1.2)
Total Protein: 6.7 g/dL (ref 6.5–8.1)

## 2019-08-01 LAB — CBC
HCT: 39.7 % (ref 36.0–46.0)
Hemoglobin: 12.5 g/dL (ref 12.0–15.0)
MCH: 27.4 pg (ref 26.0–34.0)
MCHC: 31.5 g/dL (ref 30.0–36.0)
MCV: 86.9 fL (ref 80.0–100.0)
Platelets: 287 10*3/uL (ref 150–400)
RBC: 4.57 MIL/uL (ref 3.87–5.11)
RDW: 15.6 % — ABNORMAL HIGH (ref 11.5–15.5)
WBC: 4.6 10*3/uL (ref 4.0–10.5)
nRBC: 0 % (ref 0.0–0.2)

## 2019-08-01 LAB — RESPIRATORY PANEL BY RT PCR (FLU A&B, COVID)
Influenza A by PCR: NEGATIVE
Influenza B by PCR: NEGATIVE
SARS Coronavirus 2 by RT PCR: NEGATIVE

## 2019-08-01 LAB — SEDIMENTATION RATE: Sed Rate: 26 mm/hr (ref 0–30)

## 2019-08-01 LAB — HIV ANTIBODY (ROUTINE TESTING W REFLEX): HIV Screen 4th Generation wRfx: NONREACTIVE

## 2019-08-01 LAB — CK: Total CK: 238 U/L — ABNORMAL HIGH (ref 38–234)

## 2019-08-01 MED ORDER — LEVOTHYROXINE SODIUM 50 MCG PO TABS
75.0000 ug | ORAL_TABLET | Freq: Every day | ORAL | Status: DC
  Administered 2019-08-02 – 2019-08-09 (×8): 75 ug via ORAL
  Filled 2019-08-01 (×9): qty 1

## 2019-08-01 MED ORDER — MORPHINE SULFATE (PF) 2 MG/ML IV SOLN
2.0000 mg | Freq: Once | INTRAVENOUS | Status: AC
  Administered 2019-08-01: 2 mg via INTRAVENOUS
  Filled 2019-08-01: qty 1

## 2019-08-01 MED ORDER — SODIUM CHLORIDE 0.9 % IV SOLN: INTRAVENOUS | Status: DC

## 2019-08-01 MED ORDER — ACETAMINOPHEN 650 MG RE SUPP: 650.0000 mg | Freq: Four times a day (QID) | RECTAL | Status: DC | PRN

## 2019-08-01 MED ORDER — ONDANSETRON HCL 4 MG/2ML IJ SOLN
4.0000 mg | Freq: Once | INTRAMUSCULAR | Status: AC
  Administered 2019-08-01: 4 mg via INTRAVENOUS
  Filled 2019-08-01: qty 2

## 2019-08-01 MED ORDER — ACETAMINOPHEN 325 MG PO TABS
650.0000 mg | ORAL_TABLET | Freq: Four times a day (QID) | ORAL | Status: DC | PRN
  Administered 2019-08-01 – 2019-08-02 (×2): 650 mg via ORAL
  Filled 2019-08-01 (×3): qty 2

## 2019-08-01 MED ORDER — ONDANSETRON HCL 4 MG/2ML IJ SOLN
4.0000 mg | Freq: Four times a day (QID) | INTRAMUSCULAR | Status: DC | PRN
  Administered 2019-08-06 – 2019-08-07 (×2): 4 mg via INTRAVENOUS
  Filled 2019-08-01 (×2): qty 2

## 2019-08-01 MED ORDER — GABAPENTIN 300 MG PO CAPS
300.0000 mg | ORAL_CAPSULE | Freq: Every day | ORAL | Status: DC
  Filled 2019-08-01 (×6): qty 1

## 2019-08-01 MED ORDER — SENNOSIDES-DOCUSATE SODIUM 8.6-50 MG PO TABS
2.0000 | ORAL_TABLET | Freq: Every evening | ORAL | Status: DC | PRN
  Administered 2019-08-07: 01:00:00 2 via ORAL
  Filled 2019-08-01: qty 2

## 2019-08-01 MED ORDER — ENOXAPARIN SODIUM 40 MG/0.4ML ~~LOC~~ SOLN
40.0000 mg | SUBCUTANEOUS | Status: DC
Start: 2019-08-01 — End: 2019-08-01

## 2019-08-01 MED ORDER — TIZANIDINE HCL 2 MG PO TABS
2.0000 mg | ORAL_TABLET | Freq: Every evening | ORAL | Status: DC | PRN
  Filled 2019-08-01: qty 1

## 2019-08-01 MED ORDER — FLUOXETINE HCL 10 MG PO CAPS
10.0000 mg | ORAL_CAPSULE | Freq: Every day | ORAL | Status: DC
  Administered 2019-08-01 – 2019-08-09 (×9): 10 mg via ORAL
  Filled 2019-08-01 (×9): qty 1

## 2019-08-01 MED ORDER — ADULT MULTIVITAMIN W/MINERALS CH
1.0000 | ORAL_TABLET | Freq: Every day | ORAL | Status: DC
  Administered 2019-08-01 – 2019-08-09 (×9): 1 via ORAL
  Filled 2019-08-01 (×9): qty 1

## 2019-08-01 MED ORDER — SODIUM CHLORIDE 0.9 % IV SOLN
1.0000 g | INTRAVENOUS | Status: DC
  Administered 2019-08-02 – 2019-08-06 (×5): 1 g via INTRAVENOUS
  Filled 2019-08-01 (×3): qty 1
  Filled 2019-08-01: qty 10
  Filled 2019-08-01 (×2): qty 1

## 2019-08-01 MED ORDER — MORPHINE SULFATE (PF) 2 MG/ML IV SOLN
1.0000 mg | Freq: Once | INTRAVENOUS | Status: AC
  Administered 2019-08-01: 1 mg via INTRAVENOUS
  Filled 2019-08-01: qty 1

## 2019-08-01 MED ORDER — MELATONIN 5 MG PO TABS
2.5000 mg | ORAL_TABLET | Freq: Every day | ORAL | Status: DC
  Administered 2019-08-01 – 2019-08-08 (×8): 2.5 mg via ORAL
  Filled 2019-08-01 (×3): qty 1
  Filled 2019-08-01: qty 0.5
  Filled 2019-08-01 (×6): qty 1

## 2019-08-01 MED ORDER — ONDANSETRON HCL 4 MG PO TABS: 4.0000 mg | ORAL_TABLET | Freq: Four times a day (QID) | ORAL | Status: DC | PRN

## 2019-08-01 MED ORDER — SODIUM CHLORIDE 0.9 % IV SOLN
1.0000 g | Freq: Once | INTRAVENOUS | Status: AC
  Administered 2019-08-01: 11:00:00 1 g via INTRAVENOUS
  Filled 2019-08-01: qty 10

## 2019-08-01 MED ORDER — ATORVASTATIN CALCIUM 20 MG PO TABS
40.0000 mg | ORAL_TABLET | Freq: Every day | ORAL | Status: DC
  Administered 2019-08-01: 13:00:00 40 mg via ORAL
  Filled 2019-08-01: qty 2

## 2019-08-01 NOTE — ED Provider Notes (Addendum)
[facility] Emergency Department Provider Note   ____________________________________________    I have reviewed the triage vital signs and the nursing notes.   HISTORY  Chief Complaint Knee pain    HPI Morgan Carpenter is a 69 y.o. female with complex past medical history including neurosarcoidosis who presents with complaints of bilateral knee pain and inability to walk.  She states pain started over the last 1 to 2 days with mild swelling in both knees but no redness or fevers.  Does have pain in the backs of the knees as well.  Patient recently seen in our emergency department treated for urinary tract infection with [facility], she reports her symptoms have improved somewhat.  However was just discharged from [place].  In reviewing records patient had admission at [facility] for leg weakness, had extensive evaluation by neurology and neurosurgery and was felt to have weakness in the legs related to urinary tract infection at that time.  The patient apparently has been unable to walk for greater than 6 months at this point although per patient it is somewhat difficult to ascertain that  Past Medical History:  Diagnosis Date  . Adrenal insufficiency ([place])   . DDD (degenerative disc disease), cervical   . DDD (degenerative disc disease), lumbar   . Hemiplegia ([place])   . Hypercholesteremia   . Hyperprolactinemia ([place])   . Hypopituitarism ([place])   . Lymphocytic hypophysitis ([place])   . OSA (obstructive sleep apnea)   . Patient is Jehovah's Witness   . Recurrent deep vein thrombosis (DVT) ([place])   . Sarcoidosis   . Stroke ([place])   . Thyroid disease   . Vitamin D deficiency     There are no problems to display for this patient.   Past Surgical History:  Procedure Laterality Date  . CHOLECYSTECTOMY    . NOSE SURGERY      Prior to Admission medications   Medication Sig Start Date End Date Taking? Authorizing Provider  cefdinir (OMNICEF) 300 MG  capsule Take 1 capsule (300 mg total) by mouth 2 (two) times daily. 07/28/19   Versie Starks, PA-C     Allergies Naprosyn [naproxen], Shellfish allergy, and Sulfa antibiotics  History reviewed. No pertinent family history.  Social History Social History   Tobacco Use  . Smoking status: Never Smoker  . Smokeless tobacco: Never Used  Substance Use Topics  . Alcohol use: Never  . Drug use: Never    Review of Systems  Constitutional: No fever/chills Eyes: No visual changes.  ENT: No sore throat. Cardiovascular: Denies chest pain. Respiratory: Denies shortness of breath. Gastrointestinal: No abdominal pain.   Genitourinary: Mild dysuria Musculoskeletal: Bilateral knee pain Skin: Negative for rash. Neurological: Negative for headaches or weakness   ____________________________________________   PHYSICAL EXAM:  VITAL SIGNS: ED Triage Vitals  Enc Vitals Group     BP 08/01/19 0749 113/81     Pulse Rate 08/01/19 0749 95     Resp 08/01/19 0749 18     Temp 08/01/19 0749 98.3 F (36.8 C)     Temp Source 08/01/19 0749 Oral     SpO2 08/01/19 0749 97 %     Weight 08/01/19 0746 83.5 kg (184 lb)     Height 08/01/19 0746 1.626 m (5\' 4" )     Head Circumference --      Peak Flow --      Pain Score 08/01/19 0746 8     Pain Loc --  Pain Edu? --      Excl. in GC? --     Constitutional: Alert and oriented.  Mild slurred speech Eyes: Conjunctivae are normal.  Head: Atraumatic. Nose: No congestion/rhinnorhea. Mouth/Throat: Mucous membranes are moist.   Neck:  Painless ROM Cardiovascular: Normal rate, regular rhythm. Grossly normal heart sounds.  Good peripheral circulation. Respiratory: Normal respiratory effort.  No retractions. Lungs CTAB. Gastrointestinal: Soft and nontender. No distention.  No CVA tenderness.  Musculoskeletal: Knees: Mild tenderness to palpation however minimal swelling, no palpable effusion, no erythema. Neurologic: Mildly slurred speech, chronic.Marland Kitchen  No new gross focal neurologic deficits are appreciated.  Skin:  Skin is warm, dry and intact. No rash noted. Psychiatric: Mood and affect are normal. Speech and behavior are normal.  ____________________________________________   LABS (all labs ordered are listed, but only abnormal results are displayed)  Labs Reviewed  CBC - Abnormal; Notable for the following components:      Result Value   RDW 15.6 (*)    All other components within normal limits  COMPREHENSIVE METABOLIC PANEL - Abnormal; Notable for the following components:   Glucose, Bld 101 (*)    Calcium 8.8 (*)    AST 76 (*)    ALT 77 (*)    GFR calc non Af Amer 59 (*)    All other components within normal limits  CK - Abnormal; Notable for the following components:   Total CK 238 (*)    All other components within normal limits  URINALYSIS, COMPLETE (UACMP) WITH MICROSCOPIC - Abnormal; Notable for the following components:   Color, Urine RED (*)    APPearance TURBID (*)    Glucose, UA   (*)    Value: TEST NOT REPORTED DUE TO COLOR INTERFERENCE OF URINE PIGMENT   Hgb urine dipstick   (*)    Value: TEST NOT REPORTED DUE TO COLOR INTERFERENCE OF URINE PIGMENT   Bilirubin Urine   (*)    Value: TEST NOT REPORTED DUE TO COLOR INTERFERENCE OF URINE PIGMENT   Ketones, ur   (*)    Value: TEST NOT REPORTED DUE TO COLOR INTERFERENCE OF URINE PIGMENT   Protein, ur   (*)    Value: TEST NOT REPORTED DUE TO COLOR INTERFERENCE OF URINE PIGMENT   Nitrite   (*)    Value: TEST NOT REPORTED DUE TO COLOR INTERFERENCE OF URINE PIGMENT   Leukocytes,Ua   (*)    Value: TEST NOT REPORTED DUE TO COLOR INTERFERENCE OF URINE PIGMENT   Bacteria, UA FEW (*)    All other components within normal limits  RESPIRATORY PANEL BY RT PCR (FLU A&B, COVID)  SEDIMENTATION RATE   ____________________________________________  EKG  None ____________________________________________  RADIOLOGY  Ultrasound negative for DVTs Chest x-ray reviewed by  me, no pneumonia ____________________________________________   PROCEDURES  Procedure(s) performed: No  Procedures   Critical Care performed: No ____________________________________________   INITIAL IMPRESSION / ASSESSMENT AND PLAN / ED COURSE  Pertinent labs & imaging results that were available during my care of the patient were reviewed by me and considered in my medical decision making (see chart for details).  Patient presents with knee pain bilaterally.  Overall patient has minimal swelling bilaterally, no erythema she reports painful but has not taken anything except Tylenol for this.  Not consistent with septic arthritis given bilateral nature and exam.  Normal white blood cell count.  Afebrile.  Patient does report a history of arthritis which may be the cause.  We will give  low-dose IV morphine and IV Zofran while we await further tests.  Mild elevation in CK noted  Urinalysis with significant hematuria, suspect worsening of urinary tract infection complicated by blood thinners.  We will give IV Rocephin  Have discussed with the hospitalist for admission    ____________________________________________   FINAL CLINICAL IMPRESSION(S) / ED DIAGNOSES  Final diagnoses:  Lower urinary tract infectious disease  Weakness  Pain in both knees, unspecified chronicity  Sarcoidosis        Note:  This document was prepared using Dragon voice recognition software and may include unintentional dictation errors.   Jene Every, MD 08/01/19 1106    Jene Every, MD 08/01/19 [phone]

## 2019-08-01 NOTE — ED Notes (Signed)
Blue, grey, light green and lavender tubes sent to lab

## 2019-08-01 NOTE — ED Notes (Signed)
Message sent to pharmacy to verify prozac order

## 2019-08-01 NOTE — ED Notes (Signed)
Ready bed @ 1305, patient going to room 127. Spoke with Engineer, building services.

## 2019-08-01 NOTE — Evaluation (Signed)
Physical Therapy Evaluation Patient Details Name: ARIAN MCQUITTY MRN: [id number] DOB: March 03, 1951 Today's Date: 08/01/2019   History of Present Illness  Per MD notes: Pt is a 69 y.o. female with medical history significant for neurosarcoidosis, adrenal insufficiency, degenerative disc disease involving the lumbar and cervical spine, osteoarthritis, history of DVT, hypopituitarism and obstructive sleep apnea who presents to the emergency room for evaluation of bilateral knee pain and inability to walk.  Pt recently dicharged from [facility] where she spent 3 weeks in rehab.  MD assessment includes: Complicated UTI with hematuria, hypothyroidism, sarcoidosis, and transaminitis.    Clinical Impression  Pt pleasant and motivated to participate during the session but ultimately was limited by functional weakness and B knee pain.  Pt required extensive assist with bed mobility tasks and was unable to stand secondary to knee pain and poor bilateral knee ext ROM.  Pt presents with significant functional deficits compared to her stated baseline and would not be safe to return to her prior living situation at this time.  Pt will benefit from PT services in a SNF setting upon discharge to safely address deficits listed in patient problem list for decreased caregiver assistance and eventual return to PLOF.      Follow Up Recommendations SNF    Equipment Recommendations  None recommended by PT    Recommendations for Other Services       Precautions / Restrictions Precautions Precautions: Fall Restrictions Weight Bearing Restrictions: No      Mobility  Bed Mobility Overal bed mobility: Needs Assistance Bed Mobility: Supine to Sit;Sit to Supine     Supine to sit: Mod assist Sit to supine: Max assist;+2 for physical assistance   General bed mobility comments: Mod-max A for BLE and trunk control  Transfers                 General transfer comment: Unable secondary to knee pain  and limited ext ROM  Ambulation/Gait             General Gait Details: Unable/unsafe to attempt  Stairs            Wheelchair Mobility    Modified Rankin (Stroke Patients Only)       Balance Overall balance assessment: Needs assistance Sitting-balance support: Bilateral upper extremity supported Sitting balance-Leahy Scale: Good                                       Pertinent Vitals/Pain Pain Assessment: 0-10 Pain Score: 10-Worst pain ever Pain Location: Bil knees, L>R Pain Descriptors / Indicators: Aching;Sore Pain Intervention(s): Premedicated before session;Monitored during session;Limited activity within patient's tolerance    Home Living Family/patient expects to be discharged to:: Private residence Living Arrangements: Spouse/significant other Available Help at Discharge: Family;Other (Comment)(Spouse with limited ability to assist physical secondary to back problems) Type of Home: House Home Access: Level entry     Home Layout: One level Home Equipment: Walker - 2 wheels;Walker - 4 wheels      Prior Function Level of Independence: Independent with assistive device(s)         Comments: Mod Ind with amb limitd community distances with a RW, no fall history, Ind with ADLs     Hand Dominance        Extremity/Trunk Assessment   Upper Extremity Assessment Upper Extremity Assessment: Generalized weakness    Lower Extremity Assessment Lower Extremity Assessment: Generalized  weakness;RLE deficits/detail;LLE deficits/detail RLE Deficits / Details: R knee ext PROM lacking grossly 30 deg, limited by pain RLE: Unable to fully assess due to pain LLE Deficits / Details: L knee ext PROM lacking grossly 30 deg, limited by pain LLE: Unable to fully assess due to pain       Communication   Communication: No difficulties  Cognition Arousal/Alertness: Awake/alert Behavior During Therapy: WFL for tasks assessed/performed Overall  Cognitive Status: Within Functional Limits for tasks assessed                                        General Comments      Exercises Total Joint Exercises Ankle Circles/Pumps: AROM;Both;5 reps;10 reps Quad Sets: AROM;Strengthening;Both;5 reps;10 reps Heel Slides: AAROM;Both;10 reps;5 reps Hip ABduction/ADduction: AAROM;Both;5 reps Long Arc Quad: AROM;AAROM;Both;5 reps;10 reps Knee Flexion: AROM;AAROM;Both;5 reps;10 reps Other Exercises Other Exercises: Gentle, low amplitude B knee ext PROM to patient's tolerance   Assessment/Plan    PT Assessment Patient needs continued PT services  PT Problem List Decreased strength;Decreased range of motion;Decreased activity tolerance;Decreased balance;Decreased mobility;Decreased knowledge of use of DME;Pain       PT Treatment Interventions      PT Goals (Current goals can be found in the Care Plan section)  Acute Rehab PT Goals Patient Stated Goal: Walk without knee pain PT Goal Formulation: With patient Time For Goal Achievement: 08/14/19 Potential to Achieve Goals: Fair    Frequency Min 2X/week   Barriers to discharge Inaccessible home environment;Decreased caregiver support      Co-evaluation               AM-PAC PT "6 Clicks" Mobility  Outcome Measure Help needed turning from your back to your side while in a flat bed without using bedrails?: A Little Help needed moving from lying on your back to sitting on the side of a flat bed without using bedrails?: A Lot Help needed moving to and from a bed to a chair (including a wheelchair)?: Total Help needed standing up from a chair using your arms (e.g., wheelchair or bedside chair)?: Total Help needed to walk in hospital room?: Total Help needed climbing 3-5 steps with a railing? : Total 6 Click Score: 9    End of Session   Activity Tolerance: Patient limited by pain Patient left: in bed;with call bell/phone within reach;with bed alarm set;with  nursing/sitter in room Nurse Communication: Mobility status PT Visit Diagnosis: Muscle weakness (generalized) (M62.81);Pain Pain - Right/Left: Left(bilateral) Pain - part of body: Knee    Time: 4403-4742 PT Time Calculation (min) (ACUTE ONLY): 22 min   Charges:   PT Evaluation $PT Eval Moderate Complexity: 1 Mod PT Treatments $Therapeutic Exercise: 8-22 mins        D. Elly Modena PT, DPT 08/01/19, 5:33 PM

## 2019-08-01 NOTE — ED Notes (Signed)
Report given to Candace, RN

## 2019-08-01 NOTE — H&P (Signed)
History and Physical    Morgan Carpenter JHE:[id number] DOB: [birth year] DOA: 08/01/2019  PCP: Su Monks, MD   Patient coming from: Home  I have personally briefly reviewed patient's old medical records in [place]  Chief Complaint: Dysuria                                Lower extremity weakness  HPI: Morgan Carpenter is a 69 y.o. female with medical history significant for neurosarcoidosis, adrenal insufficiency, degenerative disc disease involving the lumbar and cervical spine, osteoarthritis, history of DVT, hypopituitarism and obstructive sleep apnea who presents to the emergency room for evaluation of bilateral knee pain and inability to walk.  Patient stated that pain in both knees started about 1 to 2 days prior to admission associated with swelling but no redness or fever.  Patient was discharged from subacute rehab on Friday, April 23 where she spent 3 weeks.  According to her husband she was able to get around with her rolling walker on Friday and Saturday but since then has been back bound due to pain in her knees and calf.  He had called EMS over the weekend because the patient fell and was unable to get up, they assisted patient into her bed.  She was seen in the emergency room over the weekend and at that time had pyuria and was discharged home on Omnicef.  Her husband states that she has been taking the antibiotic as recommended and developed diarrhea related to same.  Patient states despite taking antibiotic therapy she continues to have dysuria and frequency.  ED Course: Patient presented for evaluation of bilateral knee pain but noted to have significant hematuria.  Urine culture yields Citrobacter Freundii.  Patient received a dose of Rocephin 1g IV  Review of Systems: As per HPI otherwise 10 point review of systems negative.    Past Medical History:  Diagnosis Date  . Adrenal insufficiency ([place])   . DDD (degenerative disc disease), cervical   . DDD (degenerative disc  disease), lumbar   . Hemiplegia ([place])   . Hypercholesteremia   . Hyperprolactinemia ([place])   . Hypopituitarism ([place])   . Lymphocytic hypophysitis ([place])   . OSA (obstructive sleep apnea)   . Patient is Jehovah's Witness   . Recurrent deep vein thrombosis (DVT) ([place])   . Sarcoidosis   . Stroke ([place])   . Thyroid disease   . Vitamin D deficiency     Past Surgical History:  Procedure Laterality Date  . CHOLECYSTECTOMY    . NOSE SURGERY       reports that she has never smoked. She has never used smokeless tobacco. She reports that she does not drink alcohol or use drugs.  Allergies  Allergen Reactions  . Naprosyn [Naproxen] Hives  . Shellfish Allergy Hives  . Sulfa Antibiotics Hives    History reviewed. No pertinent family history.   Prior to Admission medications   Medication Sig Start Date End Date Taking? Authorizing Provider  melatonin 3 MG TABS tablet Take by mouth. 07/03/19  Yes [provider]  senna-docusate (SENOKOT-S) 8.6-50 MG tablet Take by mouth. 01/07/15  Yes [provider]  atorvastatin (LIPITOR) 40 MG tablet Take 40 mg by mouth daily. 07/21/19   [provider]  cefdinir (OMNICEF) 300 MG capsule Take 1 capsule (300 mg total) by mouth 2 (two) times daily. 07/28/19   Caryn Section Linden Dolin, PA-C  FLUoxetine (  PROZAC) 10 MG capsule Take 10 mg by mouth daily. 07/13/19   [provider]  gabapentin (NEURONTIN) 300 MG capsule PLEASE SEE ATTACHED FOR DETAILED DIRECTIONS 06/14/19   [provider]  levothyroxine (SYNTHROID) 75 MCG tablet PLEASE SEE ATTACHED FOR DETAILED DIRECTIONS 06/18/19   [provider]  Multiple Vitamin (MULTIVITAMIN) capsule Take by mouth.    [provider]  tiZANidine (ZANAFLEX) 2 MG tablet Take 2 mg by mouth at bedtime as needed. 06/24/19   [provider]    Physical Exam: Vitals:   08/01/19 0749 08/01/19 0930 08/01/19 1000 08/01/19 1100  BP: 113/81  128/78 128/72  Pulse: 95 90 83 81   Resp: 18     Temp: 98.3 F (36.8 C)     TempSrc: Oral     SpO2: 97% 96% 95% 98%  Weight:      Height:         Vitals:   08/01/19 0749 08/01/19 0930 08/01/19 1000 08/01/19 1100  BP: 113/81  128/78 128/72  Pulse: 95 90 83 81  Resp: 18     Temp: 98.3 F (36.8 C)     TempSrc: Oral     SpO2: 97% 96% 95% 98%  Weight:      Height:        Constitutional: NAD, alert and oriented x 3.  Flat affect, obese Eyes: PERRL, lids and conjunctivae normal ENMT: Mucous membranes are moist.  Neck: normal, supple, no masses, no thyromegaly Respiratory: clear to auscultation bilaterally, no wheezing, no crackles. Normal respiratory effort. No accessory muscle use.  Cardiovascular: Regular rate and rhythm, no murmurs / rubs / gallops. No extremity edema. 2+ pedal pulses. No carotid bruits.  Abdomen: no tenderness, no masses palpated. No hepatosplenomegaly. Bowel sounds positive.  Central adiposity Musculoskeletal: no clubbing / cyanosis.  Bilateral knee swelling, no redness Skin: no rashes, lesions, ulcers.  Neurologic: No gross focal neurologic deficit. Psychiatric: Normal mood and affect.   Labs on Admission: I have personally reviewed following labs and imaging studies  CBC: Recent Labs  Lab 07/28/19 1139 08/01/19 0759  WBC 6.1 4.6  HGB 13.0 12.5  HCT 41.5 39.7  MCV 88.7 86.9  PLT 302 287   Basic Metabolic Panel: Recent Labs  Lab 07/28/19 1139 08/01/19 0759  NA 139 137  K 3.7 3.6  CL 105 106  CO2 25 22  GLUCOSE 92 101*  BUN 13 11  CREATININE 1.10* 0.99  CALCIUM 9.0 8.8*   GFR: Estimated Creatinine Clearance: 58.4 mL/min (by C-G formula based on SCr of 0.99 mg/dL). Liver Function Tests: Recent Labs  Lab 07/28/19 1139 08/01/19 0759  AST 24 76*  ALT 25 77*  ALKPHOS 80 75  BILITOT 0.6 0.6  PROT 7.0 6.7  ALBUMIN 4.0 3.7   Recent Labs  Lab 07/28/19 1139  LIPASE 25   No results for input(s): AMMONIA in the last 168 hours. Coagulation Profile: No results for  input(s): INR, PROTIME in the last 168 hours. Cardiac Enzymes: Recent Labs  Lab 08/01/19 0759  CKTOTAL 238*   BNP (last 3 results) No results for input(s): PROBNP in the last 8760 hours. HbA1C: No results for input(s): HGBA1C in the last 72 hours. CBG: No results for input(s): GLUCAP in the last 168 hours. Lipid Profile: No results for input(s): CHOL, HDL, LDLCALC, TRIG, CHOLHDL, LDLDIRECT in the last 72 hours. Thyroid Function Tests: No results for input(s): TSH, T4TOTAL, FREET4, T3FREE, THYROIDAB in the last 72 hours. Anemia Panel: No results for  input(s): VITAMINB12, FOLATE, FERRITIN, TIBC, IRON, RETICCTPCT in the last 72 hours. Urine analysis:    Component Value Date/Time   COLORURINE RED (A) 08/01/2019 0829   APPEARANCEUR TURBID (A) 08/01/2019 0829   LABSPEC 1.030 08/01/2019 0829   PHURINE  08/01/2019 0829    TEST NOT REPORTED DUE TO COLOR INTERFERENCE OF URINE PIGMENT   GLUCOSEU (A) 08/01/2019 0829    TEST NOT REPORTED DUE TO COLOR INTERFERENCE OF URINE PIGMENT   HGBUR (A) 08/01/2019 0829    TEST NOT REPORTED DUE TO COLOR INTERFERENCE OF URINE PIGMENT   BILIRUBINUR (A) 08/01/2019 0829    TEST NOT REPORTED DUE TO COLOR INTERFERENCE OF URINE PIGMENT   KETONESUR (A) 08/01/2019 0829    TEST NOT REPORTED DUE TO COLOR INTERFERENCE OF URINE PIGMENT   PROTEINUR (A) 08/01/2019 0829    TEST NOT REPORTED DUE TO COLOR INTERFERENCE OF URINE PIGMENT   NITRITE (A) 08/01/2019 0829    TEST NOT REPORTED DUE TO COLOR INTERFERENCE OF URINE PIGMENT   LEUKOCYTESUR (A) 08/01/2019 0829    TEST NOT REPORTED DUE TO COLOR INTERFERENCE OF URINE PIGMENT    Radiological Exams on Admission: US Venous Img Lower Bilateral  Result Date: 08/01/2019 CLINICAL DATA:  Cramping behind the knees bilaterally for 2 days, decreased mobility. Also reportedly with prior DVT on anticoagulation. EXAM: Bilateral LOWER EXTREMITY VENOUS DOPPLER ULTRASOUND TECHNIQUE: Gray-scale sonography with compression, as well  as color and duplex ultrasound, were performed to evaluate the deep venous system(s) from the level of the common femoral vein through the popliteal and proximal calf veins. COMPARISON:  None FINDINGS: VENOUS Normal compressibility of the common femoral, superficial femoral, and popliteal veins, as well as the visualized calf veins. Visualized portions of profunda femoral vein and great saphenous vein unremarkable. No filling defects to suggest DVT on grayscale or color Doppler imaging. Doppler waveforms show normal direction of venous flow, normal respiratory plasticity and response to augmentation. OTHER None. Limitations: Limited visualization of the bilateral calf pain, specifically peroneal vein IMPRESSION: Negative for DVT in the bilateral lower extremities. Mildly limited assessment of the calf as described. Electronically Signed   By: Donzetta Kohut M.D.   On: 08/01/2019 09:45   DG Chest Port 1 View  Result Date: 08/01/2019 CLINICAL DATA:  Weakness. EXAM: PORTABLE CHEST 1 VIEW COMPARISON:  None. FINDINGS: 0833 hours. The lungs are clear without focal pneumonia, edema, pneumothorax or pleural effusion. The cardiopericardial silhouette is within normal limits for size. The visualized bony structures of the thorax are intact. IMPRESSION: No active disease. Electronically Signed   By: Kennith Center M.D.   On: 08/01/2019 08:59    EKG: Independently reviewed.   Assessment/Plan Active Problems:   Complicated UTI (urinary tract infection)   Thyroid disease   Sarcoidosis   OSA (obstructive sleep apnea)    Complicated UTI Patient presents with hematuria Recent urine culture yielded Citrobacter Freundii Will place patient on Rocephin 1g IV daily   Hypothyroidism Continue Levothyroxine   Depression Continue Prozac   Transaminitis Unclear etiology Hold statins Obtain CK levels  DVT prophylaxis: Lovenox Code Status: Full code Family Communication: Greater than 50% of time was spent  discussing plan of care with patient and her husband at the bedside.  They verbalized understanding and agree with the plan Disposition Plan: Back to previous home environment Consults called: Physical therapy    Nizhoni Parlow MD [facility]     08/01/2019, 11:37 AM

## 2019-08-01 NOTE — TOC Initial Note (Addendum)
Transition of Care (TOC) - Initial/Assessment Note    Patient Details  Name: Morgan Carpenter MRN: [id number] Date of Birth: 05/09/1950  Transition of Care (TOC) CM/SW Contact:    Dupree, Rebecca G, LCSW Phone Number: 08/01/2019, 3:38 PM  Clinical Narrative:   Met with pt who reports she was at [facility] until last Friday when she went home. She told them she had a UTI but they did not send the specimen in and she was not treated until discharged home and husband brought to the ER. Then got worse and came back here. She has a history of CVA's and neurosarcodiosis. Her husband-larry hurt his back trying to help her up and she knows he needs to be careful now. She was ambulating last Friday with her rollator and now since then has been bed bound due to pain in her legs and stomach area. They do have one son in the [place] area who is supportive. Her husband-Larry is her caregiver but she worries about him and now his back. She hopes to do well here and get back to her ambulating with her rollator so she will not be as much care at home. Did not have home health arranged from SNF. Will ask for PT eval and see if would benefit from this while here and once discharged home. Husband does drive and transport her to her appointments. She does have a PCP. Will continue to follow and work on discharge needs.              3:51 PM Pt is active with [facility]-PT,OT & RN  Expected Discharge Plan: Home/Self Care Barriers to Discharge: Continued Medical Work up   Patient Goals and CMS Choice Patient states their goals for this hospitalization and ongoing recovery are:: I want to feel better before going home, this infection just kept getting worse      Expected Discharge Plan and Services Expected Discharge Plan: Home/Self Care In-house Referral: Clinical Social Work     Living arrangements for the past 2 months: Single Family Home                                      Prior Living  Arrangements/Services Living arrangements for the past 2 months: Single Family Home Lives with:: Spouse Patient language and need for interpreter reviewed:: No Do you feel safe going back to the place where you live?: Yes      Need for Family Participation in Patient Care: Yes (Comment) Care giver support system in place?: Yes (comment) Current home services: DME(rollator) Criminal Activity/Legal Involvement Pertinent to Current Situation/Hospitalization: No - Comment as needed  Activities of Daily Living      Permission Sought/Granted Permission sought to share information with : Family Supports Permission granted to share information with : Yes, Verbal Permission Granted  Share Information with NAME: Larry     Permission granted to share info w Relationship: husband     Emotional Assessment Appearance:: Appears stated age Attitude/Demeanor/Rapport: Engaged Affect (typically observed): Accepting, Pleasant Orientation: : Oriented to Self, Oriented to Place, Oriented to  Time, Oriented to Situation   Psych Involvement: No (comment)  Admission diagnosis:  Sarcoidosis [D86.9] Lower urinary tract infectious disease [N39.0] Weakness [R53.1] Transaminitis [R74.01] DVT (deep venous thrombosis) (HCC) [I82.409] Complicated UTI (urinary tract infection) [N39.0] Pain in both knees, unspecified chronicity [M25.561, M25.562] Patient Active Problem List   Diagnosis Date Noted  .   Complicated UTI (urinary tract infection) 08/01/2019  . Thyroid disease   . Sarcoidosis   . OSA (obstructive sleep apnea)    PCP:  Su Monks, MD Pharmacy:   [id number] [place], [id number]: [phone]: [phone]    Social Determinants of Health (SDOH) Interventions    Readmission Risk Interventions No flowsheet data found.

## 2019-08-01 NOTE — ED Notes (Signed)
Sent Dr Joylene Igo secure message about pt's pain in her legs

## 2019-08-01 NOTE — ED Notes (Signed)
Pt resting calmly in bed. Reports that she had both of her covid shots.

## 2019-08-01 NOTE — Plan of Care (Signed)

## 2019-08-01 NOTE — ED Notes (Signed)
Ultrasound at bedside

## 2019-08-01 NOTE — ED Notes (Signed)
Pt given cup of water with verbal okay from EDP Kinner. Pt denies any other needs. Bed locked low. Rails up. Call bell within reach.

## 2019-08-01 NOTE — ED Triage Notes (Signed)
Pt arrives via [facility] from home for c/o recent UTI that she is taking PO rocephin for as well as "unable to straighten legs". PT reports this is not baseline for her. Pt recently discharged home from [facility] for UTI. Pt has diarrhea from abx as well. Pt A&Ox4 and in NAD.

## 2019-08-02 ENCOUNTER — Inpatient Hospital Stay: Payer: Medicare Other

## 2019-08-02 DIAGNOSIS — R7401 Elevation of levels of liver transaminase levels: Secondary | ICD-10-CM

## 2019-08-02 DIAGNOSIS — M25562 Pain in left knee: Secondary | ICD-10-CM

## 2019-08-02 DIAGNOSIS — M25561 Pain in right knee: Secondary | ICD-10-CM

## 2019-08-02 LAB — BASIC METABOLIC PANEL
Anion gap: 7 (ref 5–15)
BUN: 10 mg/dL (ref 8–23)
CO2: 25 mmol/L (ref 22–32)
Calcium: 8.1 mg/dL — ABNORMAL LOW (ref 8.9–10.3)
Chloride: 108 mmol/L (ref 98–111)
Creatinine, Ser: 0.93 mg/dL (ref 0.44–1.00)
GFR calc Af Amer: >60 mL/min (ref >60)
GFR calc non Af Amer: >60 mL/min (ref >60)
Glucose, Bld: 97 mg/dL (ref 70–99)
Potassium: 4.3 mmol/L (ref 3.5–5.1)
Sodium: 140 mmol/L (ref 135–145)

## 2019-08-02 LAB — CBC
HCT: 35.9 % — ABNORMAL LOW (ref 36.0–46.0)
Hemoglobin: 11.2 g/dL — ABNORMAL LOW (ref 12.0–15.0)
MCH: 27.7 pg (ref 26.0–34.0)
MCHC: 31.2 g/dL (ref 30.0–36.0)
MCV: 88.6 fL (ref 80.0–100.0)
Platelets: 245 10*3/uL (ref 150–400)
RBC: 4.05 MIL/uL (ref 3.87–5.11)
RDW: 15.4 % (ref 11.5–15.5)
WBC: 4.1 10*3/uL (ref 4.0–10.5)
nRBC: 0 % (ref 0.0–0.2)

## 2019-08-02 MED ORDER — ENOXAPARIN SODIUM 40 MG/0.4ML ~~LOC~~ SOLN
40.0000 mg | SUBCUTANEOUS | Status: DC
  Administered 2019-08-02 – 2019-08-07 (×6): 40 mg via SUBCUTANEOUS
  Filled 2019-08-02 (×6): qty 0.4

## 2019-08-02 MED ORDER — KETOROLAC TROMETHAMINE 15 MG/ML IJ SOLN
15.0000 mg | Freq: Four times a day (QID) | INTRAMUSCULAR | Status: DC | PRN
  Administered 2019-08-02 – 2019-08-06 (×12): 15 mg via INTRAVENOUS
  Filled 2019-08-02 (×12): qty 1

## 2019-08-02 MED ORDER — MORPHINE SULFATE (PF) 2 MG/ML IV SOLN
1.0000 mg | Freq: Once | INTRAVENOUS | Status: AC
  Administered 2019-08-02: 1 mg via INTRAVENOUS
  Filled 2019-08-02: qty 1

## 2019-08-02 NOTE — Plan of Care (Signed)

## 2019-08-02 NOTE — NC FL2 (Signed)
[place] LEVEL OF CARE SCREENING TOOL     IDENTIFICATION  Patient Name: Morgan Carpenter Birthdate: 08/04/1950 Sex: female Admission Date (Current Location): 08/01/2019  [place] and [place] Number:  Engineering geologist and Address:  [facility], [address] [place], [place] [address]      Provider Number: [id number]  Attending Physician Name and Address:  Louellen Molder, MD  Relative Name and Phone Number:  Joyce Gross [phone]    Current Level of Care: Hospital Recommended Level of Care: [place] Prior Approval Number:    Date Approved/Denied:   PASRR Number: [id number]  Discharge Plan: SNF    Current Diagnoses: Patient Active Problem List   Diagnosis Date Noted  . Complicated UTI (urinary tract infection) 08/01/2019  . Thyroid disease   . Sarcoidosis   . OSA (obstructive sleep apnea)     Orientation RESPIRATION BLADDER Height & Weight     Self, Time, Situation, Place  Normal External catheter Weight: 184 lb (83.5 kg) Height:  5\' 4"  (162.6 cm)  BEHAVIORAL SYMPTOMS/MOOD NEUROLOGICAL BOWEL NUTRITION STATUS      Continent Diet(regular thin liquids)  AMBULATORY STATUS COMMUNICATION OF NEEDS Skin   Extensive Assist Verbally Normal                       Personal Care Assistance Level of Assistance  Bathing, Dressing Bathing Assistance: Limited assistance   Dressing Assistance: Limited assistance     Functional Limitations Info  Speech     Speech Info: Impaired(hx-CVA's)    SPECIAL CARE FACTORS FREQUENCY  PT (By licensed PT), OT (By licensed OT)     PT Frequency: 5x week OT Frequency: 5x week            Contractures Contractures Info: Present    Additional Factors Info  Code Status, Allergies Code Status Info: Full Code Allergies Info: Shellfish allergy, Sulfa Antibiotics, Naproxen           Current Medications (08/02/2019):  This is the current hospital  active medication list Current Facility-Administered Medications  Medication Dose Route Frequency Provider Last Rate Last Admin  . 0.9 %  sodium chloride infusion   Intravenous Continuous Dhungel, Nishant, MD 100 mL/hr at 08/02/19 0748 New Bag at 08/02/19 0748  . acetaminophen (TYLENOL) tablet 650 mg  650 mg Oral Q6H PRN Agbata, Tochukwu, MD   650 mg at 08/02/19 0951   Or  . acetaminophen (TYLENOL) suppository 650 mg  650 mg Rectal Q6H PRN Agbata, Tochukwu, MD      . cefTRIAXone (ROCEPHIN) 1 g in sodium chloride 0.9 % 100 mL IVPB  1 g Intravenous Q24H Agbata, Tochukwu, MD 200 mL/hr at 08/02/19 1152 1 g at 08/02/19 1152  . enoxaparin (LOVENOX) injection 40 mg  40 mg Subcutaneous Q24H Dhungel, Nishant, MD      . FLUoxetine (PROZAC) capsule 10 mg  10 mg Oral Daily Agbata, Tochukwu, MD   10 mg at 08/02/19 0951  . gabapentin (NEURONTIN) capsule 300 mg  300 mg Oral QHS Agbata, Tochukwu, MD      . ketorolac (TORADOL) 15 MG/ML injection 15 mg  15 mg Intravenous Q6H PRN Dhungel, Nishant, MD   15 mg at 08/02/19 1147  . levothyroxine (SYNTHROID) tablet 75 mcg  75 mcg Oral Q0600 Agbata, Tochukwu, MD   75 mcg at 08/02/19 0841  . melatonin tablet 2.5 mg  2.5 mg Oral QHS Agbata, Tochukwu, MD   2.5 mg at 08/01/19  2036  . multivitamin with minerals tablet 1 tablet  1 tablet Oral Daily Agbata, Tochukwu, MD   1 tablet at 08/02/19 0842  . ondansetron (ZOFRAN) tablet 4 mg  4 mg Oral Q6H PRN Agbata, Tochukwu, MD       Or  . ondansetron (ZOFRAN) injection 4 mg  4 mg Intravenous Q6H PRN Agbata, Tochukwu, MD      . senna-docusate (Senokot-S) tablet 2 tablet  2 tablet Oral QHS PRN Agbata, Tochukwu, MD      . tiZANidine (ZANAFLEX) tablet 2 mg  2 mg Oral QHS PRN Agbata, Tochukwu, MD         Discharge Medications: Please see discharge summary for a list of discharge medications.  Relevant Imaging Results:  Relevant Lab Results:   Additional Information SSN: [id number]  Pardeep Pautz, Lemar Livings, LCSW

## 2019-08-02 NOTE — Progress Notes (Signed)
PROGRESS NOTE    Morgan Carpenter  ASN:[id number] DOB: [birth year] DOA: 08/01/2019 PCP: Michail Sermon, MD    Chief Complaint  Patient presents with  . Urinary Tract Infection    Brief Narrative:  69 year old female with neurosarcoidosis, adrenal insufficiency, chronic left MCA stroke, DJD involving lumbar and cervical spine, osteoarthritis, history of DVT and hypopituitarism along with obstructive sleep apnea presented with bilateral knee pain (more prominent on the left) with inability to walk.  Symptoms started about 2 days prior to admission with swelling and tenderness but no fever.  She was discharged from short-term rehab on 4/23.  She was seen by orthopedics 1 month back regarding her low back pain and imaging suggested cervical myelopathy and lumbar stenosis/spondylolisthesis with plan on C3-C5 PCDF at [facility]. Per husband patient has difficulty using a rolling walker for the past few days prior to admission after sustaining a fall at home.  She was seen in the ED where she was found to have UTI and discharged home on Omnicef.  However patient started developing diarrhea while on antibiotic and also had dysuria with increased urinary frequency. UA in the ED showed significant hematuria with culture growing Citrobacter freundii.  Placed on IV Rocephin and  Assessment & Plan: Principal problem   Complicated UTI (urinary tract infection) With associated hematuria and intolerant to p.o. antibiotic.  Culture growing Citrobacter.  On IV Rocephin.  Active problems Bilateral knee pain (L >R) Reports symptoms to be new with severe pain and fall.  Obtain x-rays of the knee.  Unable to participate with PT due to pain and requiring IV Toradol.  Cervical myelopathy and lumbar spondylosis Being evaluated by orthopedics with plan on posterior cervical decompression and fusion at [facility].   Hypothyroidism Continue Synthroid  Chronic depression Continue Prozac  Transaminitis Hold statin.  CK mildly  elevated.  Recheck labs in a.m.     DVT prophylaxis: Subcu Lovenox Code Status: Full code Family Communication: Husband involved in care Disposition:   Status is: Inpatient  Remains inpatient appropriate because:Unsafe d/c plan.  Patient unsafe for discharge home due to fall risk .   Dispo: The patient is from: Home              Anticipated d/c is to: SNF              Anticipated d/c date is: 1 day              Patient currently is not medically stable to d/c.        Consultants:   None   Procedures: None   Antimicrobials: IV Rocephin   Subjective: Seen and examined.  Complains of severe pain in her knees.  Objective: Vitals:   08/01/19 1547 08/01/19 2035 08/01/19 2337 08/02/19 0139  BP: 128/73 (!) 150/78 139/80 139/83  Pulse: 88 90 86 91  Resp: 18 16 16 16   Temp: 97.9 F (36.6 C) 98.4 F (36.9 C) 98 F (36.7 C)   TempSrc: Oral Oral    SpO2: 98% 98% 99% 100%  Weight:      Height:        Intake/Output Summary (Last 24 hours) at 08/02/2019 1304 Last data filed at 08/01/2019 1712 Gross per 24 hour  Intake 358.33 ml  Output --  Net 358.33 ml   Filed Weights   08/01/19 0746  Weight: 83.5 kg    Examination:  General exam: Elderly female not in distress HEENT: Moist mucosa, supple neck Chest: Clear CVs: Normal S1-S2  GI: Soft, nondistended, nontender Musculoskeletal: Warm, tender to pressure over bilateral knee with minimal swelling, no warmth or erythema   Data Reviewed: I have personally reviewed following labs and imaging studies  CBC: Recent Labs  Lab 07/28/19 1139 08/01/19 0759 08/02/19 0632  WBC 6.1 4.6 4.1  HGB 13.0 12.5 11.2*  HCT 41.5 39.7 35.9*  MCV 88.7 86.9 88.6  PLT 302 287 245    Basic Metabolic Panel: Recent Labs  Lab 07/28/19 1139 08/01/19 0759 08/02/19 0632  NA 139 137 140  K 3.7 3.6 4.3  CL 105 106 108  CO2 25 22 25   GLUCOSE 92 101* 97  BUN 13 11 10   CREATININE 1.10* 0.99 0.93  CALCIUM 9.0 8.8* 8.1*     GFR: Estimated Creatinine Clearance: 62.2 mL/min (by C-G formula based on SCr of 0.93 mg/dL).  Liver Function Tests: Recent Labs  Lab 07/28/19 1139 08/01/19 0759  AST 24 76*  ALT 25 77*  ALKPHOS 80 75  BILITOT 0.6 0.6  PROT 7.0 6.7  ALBUMIN 4.0 3.7    CBG: No results for input(s): GLUCAP in the last 168 hours.   Recent Results (from the past 240 hour(s))  Urine culture     Status: Abnormal   Collection Time: 07/28/19 11:39 AM   Specimen: Urine, Random  Result Value Ref Range Status   Specimen Description   Final    URINE, RANDOM Performed at [facility], [address], [place], [address] [place]    Special Requests   Final    NONE Performed at [facility], [address] [place], [address] [place]    Culture >=100,000 COLONIES/mL CITROBACTER FREUNDII (A)  Final   Report Status 07/30/2019 FINAL  Final   Organism ID, Bacteria CITROBACTER FREUNDII (A)  Final      Susceptibility   Citrobacter freundii - MIC*    CEFAZOLIN >=64 RESISTANT Resistant     CEFTRIAXONE <=1 SENSITIVE Sensitive     CIPROFLOXACIN <=0.25 SENSITIVE Sensitive     GENTAMICIN <=1 SENSITIVE Sensitive     IMIPENEM 1 SENSITIVE Sensitive     NITROFURANTOIN <=16 SENSITIVE Sensitive     TRIMETH/SULFA <=20 SENSITIVE Sensitive     PIP/TAZO <=4 SENSITIVE Sensitive     * >=100,000 COLONIES/mL CITROBACTER FREUNDII  Respiratory Panel by RT PCR (Flu A&B, Covid) - Nasopharyngeal Swab     Status: None   Collection Time: 08/01/19 11:21 AM   Specimen: Nasopharyngeal Swab  Result Value Ref Range Status   SARS Coronavirus 2 by RT PCR NEGATIVE NEGATIVE Final    Comment: (NOTE) SARS-CoV-2 target nucleic acids are NOT DETECTED. The SARS-CoV-2 RNA is generally detectable in upper respiratoy specimens during the acute phase of infection. The lowest concentration of SARS-CoV-2 viral copies this assay can detect is 131 copies/mL. A negative result does not preclude SARS-Cov-2 infection and  should not be used as the sole basis for treatment or other patient management decisions. A negative result may occur with  improper specimen collection/handling, submission of specimen other than nasopharyngeal swab, presence of viral mutation(s) within the areas targeted by this assay, and inadequate number of viral copies (<131 copies/mL). A negative result must be combined with clinical observations, patient history, and epidemiological information. The expected result is Negative. Fact Sheet for Patients:  08/01/2019 Fact Sheet for Healthcare Providers:  08/03/19 This test is not yet ap proved or cleared by the https://www.moore.com/ FDA and  has been authorized for detection and/or diagnosis of SARS-CoV-2 by FDA  under an Emergency Use Authorization (EUA). This EUA will remain  in effect (meaning this test can be used) for the duration of the COVID-19 declaration under Section 564(b)(1) of the Act, 21 U.S.C. section 360bbb-3(b)(1), unless the authorization is terminated or revoked sooner.    Influenza A by PCR NEGATIVE NEGATIVE Final   Influenza B by PCR NEGATIVE NEGATIVE Final    Comment: (NOTE) The Xpert Xpress SARS-CoV-2/FLU/RSV assay is intended as an aid in  the diagnosis of influenza from Nasopharyngeal swab specimens and  should not be used as a sole basis for treatment. Nasal washings and  aspirates are unacceptable for Xpert Xpress SARS-CoV-2/FLU/RSV  testing. Fact Sheet for Patients: https://www.moore.com/ Fact Sheet for Healthcare Providers: https://www.young.biz/ This test is not yet approved or cleared by the [place] FDA and  has been authorized for detection and/or diagnosis of SARS-CoV-2 by  FDA under an Emergency Use Authorization (EUA). This EUA will remain  in effect (meaning this test can be used) for the duration of the  Covid-19 declaration under Section  564(b)(1) of the Act, 21  U.S.C. section 360bbb-3(b)(1), unless the authorization is  terminated or revoked. Performed at [facility], [address], [place], [place] [address]          Radiology Studies: US Venous Img Lower Bilateral  Result Date: 08/01/2019 CLINICAL DATA:  Cramping behind the knees bilaterally for 2 days, decreased mobility. Also reportedly with prior DVT on anticoagulation. EXAM: Bilateral LOWER EXTREMITY VENOUS DOPPLER ULTRASOUND TECHNIQUE: Gray-scale sonography with compression, as well as color and duplex ultrasound, were performed to evaluate the deep venous system(s) from the level of the common femoral vein through the popliteal and proximal calf veins. COMPARISON:  None FINDINGS: VENOUS Normal compressibility of the common femoral, superficial femoral, and popliteal veins, as well as the visualized calf veins. Visualized portions of profunda femoral vein and great saphenous vein unremarkable. No filling defects to suggest DVT on grayscale or color Doppler imaging. Doppler waveforms show normal direction of venous flow, normal respiratory plasticity and response to augmentation. OTHER None. Limitations: Limited visualization of the bilateral calf pain, specifically peroneal vein IMPRESSION: Negative for DVT in the bilateral lower extremities. Mildly limited assessment of the calf as described. Electronically Signed   By: Donzetta Kohut M.D.   On: 08/01/2019 09:45   DG Chest Port 1 View  Result Date: 08/01/2019 CLINICAL DATA:  Weakness. EXAM: PORTABLE CHEST 1 VIEW COMPARISON:  None. FINDINGS: 0833 hours. The lungs are clear without focal pneumonia, edema, pneumothorax or pleural effusion. The cardiopericardial silhouette is within normal limits for size. The visualized bony structures of the thorax are intact. IMPRESSION: No active disease. Electronically Signed   By: Kennith Center M.D.   On: 08/01/2019 08:59   US Abdomen Limited RUQ  Result Date:  08/02/2019 CLINICAL DATA:  Transaminitis EXAM: ULTRASOUND ABDOMEN LIMITED RIGHT UPPER QUADRANT COMPARISON:  None. FINDINGS: Gallbladder: Prior cholecystectomy Common bile duct: Diameter: Normal caliber, 4 mm. Liver: Increased echotexture compatible with fatty infiltration. No focal abnormality or biliary ductal dilatation. Portal vein is patent on color Doppler imaging with normal direction of blood flow towards the liver. Other: None. IMPRESSION: Fatty infiltration of the liver. Prior cholecystectomy. Electronically Signed   By: Charlett Nose M.D.   On: 08/02/2019 09:11        Scheduled Meds: . FLUoxetine  10 mg Oral Daily  . gabapentin  300 mg Oral QHS  . levothyroxine  75 mcg Oral Q0600  . melatonin  2.5  mg Oral QHS  . multivitamin with minerals  1 tablet Oral Daily   Continuous Infusions: . sodium chloride 100 mL/hr at 08/02/19 0748  . cefTRIAXone (ROCEPHIN)  IV 1 g (08/02/19 1152)     LOS: 1 day    Time spent: 25 minutes    Carlisia Geno, MD [facility]   To contact the attending provider between 7A-7P or the covering provider during after hours 7P-7A, please log into the web site [email or web address] and access using universal [place] password for that web site. If you do not have the password, please call the hospital operator.  08/02/2019, 1:04 PM

## 2019-08-03 DIAGNOSIS — M170 Bilateral primary osteoarthritis of knee: Secondary | ICD-10-CM

## 2019-08-03 LAB — COMPREHENSIVE METABOLIC PANEL
ALT: 64 U/L — ABNORMAL HIGH (ref 0–44)
AST: 62 U/L — ABNORMAL HIGH (ref 15–41)
Albumin: 3 g/dL — ABNORMAL LOW (ref 3.5–5.0)
Alkaline Phosphatase: 60 U/L (ref 38–126)
Anion gap: 6 (ref 5–15)
BUN: 8 mg/dL (ref 8–23)
CO2: 21 mmol/L — ABNORMAL LOW (ref 22–32)
Calcium: 7.9 mg/dL — ABNORMAL LOW (ref 8.9–10.3)
Chloride: 110 mmol/L (ref 98–111)
Creatinine, Ser: 0.92 mg/dL (ref 0.44–1.00)
GFR calc Af Amer: >60 mL/min (ref >60)
GFR calc non Af Amer: >60 mL/min (ref >60)
Glucose, Bld: 92 mg/dL (ref 70–99)
Potassium: 3.6 mmol/L (ref 3.5–5.1)
Sodium: 137 mmol/L (ref 135–145)
Total Bilirubin: 0.6 mg/dL (ref 0.3–1.2)
Total Protein: 5.4 g/dL — ABNORMAL LOW (ref 6.5–8.1)

## 2019-08-03 LAB — CK: Total CK: 213 U/L (ref 38–234)

## 2019-08-03 MED ORDER — ACETAMINOPHEN 500 MG PO TABS
1000.0000 mg | ORAL_TABLET | Freq: Three times a day (TID) | ORAL | Status: DC
  Administered 2019-08-03 – 2019-08-07 (×11): 1000 mg via ORAL
  Filled 2019-08-03 (×13): qty 2

## 2019-08-03 NOTE — Progress Notes (Signed)
PROGRESS NOTE    Morgan Carpenter  VFI:[id number] DOB: November 14, 1950 DOA: 08/01/2019 PCP: Michail Sermon, MD    Chief Complaint  Patient presents with  . Urinary Tract Infection    Brief Narrative:  69 year old female with neurosarcoidosis, adrenal insufficiency, chronic left MCA stroke, DJD involving lumbar and cervical spine, osteoarthritis, history of DVT and hypopituitarism along with obstructive sleep apnea presented with bilateral knee pain (more prominent on the left) with inability to walk.  Symptoms started about 2 days prior to admission with swelling and tenderness but no fever.  She was discharged from short-term rehab on 4/23.  She was seen by orthopedics 1 month back regarding her low back pain and imaging suggested cervical myelopathy and lumbar stenosis/spondylolisthesis with plan on C3-C5 PCDF at [facility]. Per husband patient has difficulty using a rolling walker for the past few days prior to admission after sustaining a fall at home.  She was seen in the ED where she was found to have UTI and discharged home on Omnicef.  However patient started developing diarrhea while on antibiotic and also had dysuria with increased urinary frequency. UA in the ED showed significant hematuria with culture growing Citrobacter freundii.  Placed on IV Rocephin and  Assessment & Plan: Principal problem   Complicated UTI (urinary tract infection) With associated hematuria and intolerant to p.o. antibiotic.  Culture growing Citrobacter.  Continue IV Rocephin.  Still has mild hematuria but no dysuria.  Active problems Bilateral knee pain (L >R) Reports symptoms to be new with severe pain and fall.  X-rays of the knee shows degenerative changes only, no fractures or effusion. Continue Toradol IV as needed.  Will place on scheduled Tylenol 1 g every 8 hours.  Patient unable to ambulate due to severe pain.  Seen by PT and recommend SNF.  Cervical myelopathy and lumbar spondylosis Being evaluated by  orthopedics with plan on posterior cervical decompression and fusion at [facility].   Hypothyroidism Continue Synthroid  Chronic depression Continue Prozac  Transaminitis Hold statin.  CK mildly elevated.      DVT prophylaxis: Subcu Lovenox Code Status: Full code Family Communication: Husband involved in care Disposition:   Status is: Inpatient  Remains inpatient appropriate because:Unsafe d/c plan.  Patient unsafe for discharge home due to fall risk .  Needs SNF.   Dispo: The patient is from: Home              Anticipated d/c is to: SNF              Anticipated d/c date is: 1 day              Patient currently is medically stable to d/c.  Needs SNF.        Consultants:   None   Procedures: None   Antimicrobials: IV Rocephin   Subjective: Patient seen and examined.  Still complaining of pain in her knees.  No dysuria.  Objective: Vitals:   08/02/19 1926 08/02/19 2038 08/03/19 0344 08/03/19 0822  BP: 122/65 124/70 (!) 143/71 (!) 148/73  Pulse: 83 78 81 78  Resp: 17 16 16 16   Temp: 98.6 F (37 C) 98.8 F (37.1 C) 98 F (36.7 C) 98.5 F (36.9 C)  TempSrc: Oral Oral Oral Oral  SpO2: 98% 100% 98% 97%  Weight:      Height:       No intake or output data in the 24 hours ending 08/03/19 1204 Filed Weights   08/01/19 0746  Weight: 83.5  kg   Physical exam Not in distress HEENT: Moist mucosa, supple neck Chest: Clear CVs: Normal S1-S2 GI: Soft, nontender, nondistended Musculoskeletal: Limited ROM of the knees with pain and tenderness   Data Reviewed: I have personally reviewed following labs and imaging studies  CBC: Recent Labs  Lab 07/28/19 1139 08/01/19 0759 08/02/19 0632  WBC 6.1 4.6 4.1  HGB 13.0 12.5 11.2*  HCT 41.5 39.7 35.9*  MCV 88.7 86.9 88.6  PLT 302 287 245    Basic Metabolic Panel: Recent Labs  Lab 07/28/19 1139 08/01/19 0759 08/02/19 0632 08/03/19 0415  NA 139 137 140 137  K 3.7 3.6 4.3 3.6  CL 105 106 108 110  CO2 25  22 25  21*  GLUCOSE 92 101* 97 92  BUN 13 11 10 8   CREATININE 1.10* 0.99 0.93 0.92  CALCIUM 9.0 8.8* 8.1* 7.9*    GFR: Estimated Creatinine Clearance: 62.9 mL/min (by C-G formula based on SCr of 0.92 mg/dL).  Liver Function Tests: Recent Labs  Lab 07/28/19 1139 08/01/19 0759 08/03/19 0415  AST 24 76* 62*  ALT 25 77* 64*  ALKPHOS 80 75 60  BILITOT 0.6 0.6 0.6  PROT 7.0 6.7 5.4*  ALBUMIN 4.0 3.7 3.0*    CBG: No results for input(s): GLUCAP in the last 168 hours.   Recent Results (from the past 240 hour(s))  Urine culture     Status: Abnormal   Collection Time: 07/28/19 11:39 AM   Specimen: Urine, Random  Result Value Ref Range Status   Specimen Description   Final    URINE, RANDOM Performed at [facility], [address], [place], [address] [place]    Special Requests   Final    NONE Performed at [facility], [address] [place], [address] [place]    Culture >=100,000 COLONIES/mL CITROBACTER FREUNDII (A)  Final   Report Status 07/30/2019 FINAL  Final   Organism ID, Bacteria CITROBACTER FREUNDII (A)  Final      Susceptibility   Citrobacter freundii - MIC*    CEFAZOLIN >=64 RESISTANT Resistant     CEFTRIAXONE <=1 SENSITIVE Sensitive     CIPROFLOXACIN <=0.25 SENSITIVE Sensitive     GENTAMICIN <=1 SENSITIVE Sensitive     IMIPENEM 1 SENSITIVE Sensitive     NITROFURANTOIN <=16 SENSITIVE Sensitive     TRIMETH/SULFA <=20 SENSITIVE Sensitive     PIP/TAZO <=4 SENSITIVE Sensitive     * >=100,000 COLONIES/mL CITROBACTER FREUNDII  Respiratory Panel by RT PCR (Flu A&B, Covid) - Nasopharyngeal Swab     Status: None   Collection Time: 08/01/19 11:21 AM   Specimen: Nasopharyngeal Swab  Result Value Ref Range Status   SARS Coronavirus 2 by RT PCR NEGATIVE NEGATIVE Final    Comment: (NOTE) SARS-CoV-2 target nucleic acids are NOT DETECTED. The SARS-CoV-2 RNA is generally detectable in upper respiratoy specimens during the acute phase of infection. The  lowest concentration of SARS-CoV-2 viral copies this assay can detect is 131 copies/mL. A negative result does not preclude SARS-Cov-2 infection and should not be used as the sole basis for treatment or other patient management decisions. A negative result may occur with  improper specimen collection/handling, submission of specimen other than nasopharyngeal swab, presence of viral mutation(s) within the areas targeted by this assay, and inadequate number of viral copies (<131 copies/mL). A negative result must be combined with clinical observations, patient history, and epidemiological information. The expected result is Negative. Fact Sheet for Patients:  08/01/2019 Fact Sheet for  Healthcare Providers:  GravelBags.it This test is not yet ap proved or cleared by the [place] and  has been authorized for detection and/or diagnosis of SARS-CoV-2 by FDA under an Emergency Use Authorization (EUA). This EUA will remain  in effect (meaning this test can be used) for the duration of the COVID-19 declaration under Section 564(b)(1) of the Act, 21 U.S.C. section 360bbb-3(b)(1), unless the authorization is terminated or revoked sooner.    Influenza A by PCR NEGATIVE NEGATIVE Final   Influenza B by PCR NEGATIVE NEGATIVE Final    Comment: (NOTE) The Xpert Xpress SARS-CoV-2/FLU/RSV assay is intended as an aid in  the diagnosis of influenza from Nasopharyngeal swab specimens and  should not be used as a sole basis for treatment. Nasal washings and  aspirates are unacceptable for Xpert Xpress SARS-CoV-2/FLU/RSV  testing. Fact Sheet for Patients: PinkCheek.be Fact Sheet for Healthcare Providers: GravelBags.it This test is not yet approved or cleared by the [place] FDA and  has been authorized for detection and/or diagnosis of SARS-CoV-2 by  FDA under an Emergency  Use Authorization (EUA). This EUA will remain  in effect (meaning this test can be used) for the duration of the  Covid-19 declaration under Section 564(b)(1) of the Act, 21  U.S.C. section 360bbb-3(b)(1), unless the authorization is  terminated or revoked. Performed at [facility], [address] [place], [place] [address]          Radiology Studies: DG Knee 1-2 Views Left  Result Date: 08/02/2019 CLINICAL DATA:  , No known injury EXAM: LEFT KNEE - 1-2 VIEW COMPARISON:  None FINDINGS: Knee flexed on both images. Osseous mineralization normal. Joint spaces preserved. No acute fracture, dislocation, bone destruction or joint effusion seen. IMPRESSION: No acute osseous abnormalities. Electronically Signed   By: Lavonia Dana M.D.   On: 08/02/2019 13:31   DG Knee 1-2 Views Right  Result Date: 08/02/2019 CLINICAL DATA:  RIGHT knee pain today.  Initial encounter. EXAM: RIGHT KNEE - 1-2 VIEW COMPARISON:  None. FINDINGS: A small linear calcification along the MEDIAL femoral condyle appears remote and likely related to prior MCL injury. Correlate with pain. No other fracture, subluxation or dislocation. No joint effusion noted. IMPRESSION: Small linear calcification along the MEDIAL femoral condyle appears remote and likely related to prior MCL injury. Correlate with pain. No other significant abnormalities. Electronically Signed   By: Margarette Canada M.D.   On: 08/02/2019 13:32   US Abdomen Limited RUQ  Result Date: 08/02/2019 CLINICAL DATA:  Transaminitis EXAM: ULTRASOUND ABDOMEN LIMITED RIGHT UPPER QUADRANT COMPARISON:  None. FINDINGS: Gallbladder: Prior cholecystectomy Common bile duct: Diameter: Normal caliber, 4 mm. Liver: Increased echotexture compatible with fatty infiltration. No focal abnormality or biliary ductal dilatation. Portal vein is patent on color Doppler imaging with normal direction of blood flow towards the liver. Other: None. IMPRESSION: Fatty infiltration of the liver.  Prior cholecystectomy. Electronically Signed   By: Rolm Baptise M.D.   On: 08/02/2019 09:11        Scheduled Meds: . enoxaparin (LOVENOX) injection  40 mg Subcutaneous Q24H  . FLUoxetine  10 mg Oral Daily  . gabapentin  300 mg Oral QHS  . levothyroxine  75 mcg Oral Q0600  . melatonin  2.5 mg Oral QHS  . multivitamin with minerals  1 tablet Oral Daily   Continuous Infusions: . sodium chloride 75 mL/hr at 08/03/19 0958  . cefTRIAXone (ROCEPHIN)  IV Stopped (08/02/19 1230)     LOS: 2 days  Time spent: 25 minutes    Odell Fasching, MD [facility]   To contact the attending provider between 7A-7P or the covering provider during after hours 7P-7A, please log into the web site [email or web address] and access using universal [place] password for that web site. If you do not have the password, please call the hospital operator.  08/03/2019, 12:04 PM

## 2019-08-04 MED ORDER — SODIUM CHLORIDE 0.9 % IV SOLN
INTRAVENOUS | Status: DC | PRN
  Administered 2019-08-04: 500 mL via INTRAVENOUS

## 2019-08-04 NOTE — Progress Notes (Signed)
Physical Therapy Treatment Patient Details Name: Morgan Carpenter MRN: [id number] DOB: 1950-05-01 Today's Date: 08/04/2019    History of Present Illness Per MD notes: Pt is a 69 y.o. female with medical history significant for neurosarcoidosis, adrenal insufficiency, degenerative disc disease involving the lumbar and cervical spine, osteoarthritis, history of DVT, hypopituitarism and obstructive sleep apnea who presents to the emergency room for evaluation of bilateral knee pain and inability to walk.  Pt recently dicharged from [facility] where she spent 3 weeks in rehab.  MD assessment includes: Complicated UTI with hematuria, hypothyroidism, sarcoidosis, and transaminitis.    PT Comments    Excited to participate.  Participated in exercises as described below.  To EOB with increased time, rail and HOB raised but she is able to do it on her own today.  Once sitting steady.  She is able to lateral scoot to R with min guard/supervision.  No assist needed but multiple self initiated rest breaks.  She is able to return to supine after 10 seated minutes with mod a to assist with LE's back onto bed.  Overall excellent effort and motivation to return home but voices understanding for need for SNF prior to discharge home.   Follow Up Recommendations  SNF     Equipment Recommendations  None recommended by PT    Recommendations for Other Services       Precautions / Restrictions Precautions Precautions: Fall Restrictions Weight Bearing Restrictions: No    Mobility  Bed Mobility Overal bed mobility: Needs Assistance Bed Mobility: Supine to Sit;Sit to Supine     Supine to sit: Min guard;Supervision;HOB elevated Sit to supine: Mod assist   General bed mobility comments: increased time with rail and HOB raised but no physical asssit.  Assist to get LE's back into bed and reposition.  Transfers Overall transfer level: Needs assistance   Transfers: Lateral/Scoot Transfers           Lateral/Scoot Transfers: Supervision General transfer comment: able to lateral scoot to R in bed with time..  Does well but unable to clear hips off bed for standing.  Ambulation/Gait             General Gait Details: Unable/unsafe to attempt   Stairs             Wheelchair Mobility    Modified Rankin (Stroke Patients Only)       Balance Overall balance assessment: Needs assistance Sitting-balance support: Bilateral upper extremity supported Sitting balance-Leahy Scale: Good     Standing balance support: Bilateral upper extremity supported Standing balance-Leahy Scale: Zero                              Cognition Arousal/Alertness: Awake/alert Behavior During Therapy: WFL for tasks assessed/performed Overall Cognitive Status: Within Functional Limits for tasks assessed                                        Exercises Total Joint Exercises Ankle Circles/Pumps: AROM;Both;5 reps;10 reps Quad Sets: AROM;Strengthening;Both;5 reps;10 reps Heel Slides: AAROM;Both;10 reps;5 reps Hip ABduction/ADduction: AAROM;Both;10 reps Straight Leg Raises: AAROM;Both;10 reps Long Arc Quad: AROM;AAROM;Both;5 reps;10 reps;Seated    General Comments        Pertinent Vitals/Pain Pain Assessment: Faces Faces Pain Scale: Hurts even more Pain Location: Bil knees, L>R Pain Descriptors / Indicators: Aching;Sore Pain Intervention(s): Limited  activity within patient's tolerance;Monitored during session    Home Living                      Prior Function            PT Goals (current goals can now be found in the care plan section) Progress towards PT goals: Progressing toward goals    Frequency    Min 2X/week      PT Plan Current plan remains appropriate    Co-evaluation              AM-PAC PT "6 Clicks" Mobility   Outcome Measure  Help needed turning from your back to your side while in a flat bed without using  bedrails?: A Little Help needed moving from lying on your back to sitting on the side of a flat bed without using bedrails?: A Little Help needed moving to and from a bed to a chair (including a wheelchair)?: Total Help needed standing up from a chair using your arms (e.g., wheelchair or bedside chair)?: Total Help needed to walk in hospital room?: Total Help needed climbing 3-5 steps with a railing? : Total 6 Click Score: 10    End of Session   Activity Tolerance: Patient limited by pain Patient left: in bed;with call bell/phone within reach;with bed alarm set Nurse Communication: Mobility status Pain - Right/Left: Left Pain - part of body: Knee     Time: 5397-6734 PT Time Calculation (min) (ACUTE ONLY): 32 min  Charges:  $Therapeutic Exercise: 8-22 mins $Therapeutic Activity: 8-22 mins                    Chesley Noon, PTA 08/04/19, 10:26 AM

## 2019-08-04 NOTE — Progress Notes (Signed)
PROGRESS NOTE    Morgan Carpenter  TDD:[id number] DOB: Nov 16, 1950 DOA: 08/01/2019 PCP: Su Monks, MD    Chief Complaint  Patient presents with  . Urinary Tract Infection    Brief Narrative:  69 year old female with neurosarcoidosis, adrenal insufficiency, chronic left MCA stroke, DJD involving lumbar and cervical spine, osteoarthritis, history of DVT and hypopituitarism along with obstructive sleep apnea presented with bilateral knee pain (more prominent on the left) with inability to walk.  Symptoms started about 2 days prior to admission with swelling and tenderness but no fever.  She was discharged from short-term rehab on 4/23.  She was seen by orthopedics 1 month back regarding her low back pain and imaging suggested cervical myelopathy and lumbar stenosis/spondylolisthesis with plan on C3-C5 PCDF at [facility]. Per husband patient has difficulty using a rolling walker for the past few days prior to admission after sustaining a fall at home.  She was seen in the ED where she was found to have UTI and discharged home on Omnicef.  However patient started developing diarrhea while on antibiotic and also had dysuria with increased urinary frequency. UA in the ED showed significant hematuria with culture growing Citrobacter freundii.  Placed on IV Rocephin and  Assessment & Plan: Principal problem   Complicated UTI (urinary tract infection) With associated hematuria and intolerant to p.o. antibiotic.  Culture growing Citrobacter.  Continue IV Rocephin.  No reported hematuria today.  No dysuria.  Active problems Bilateral knee pain (L >R) Reports symptoms to be new with severe pain and fall.  X-rays of the knee shows degenerative changes only, no fractures or effusion. Continue scheduled Tylenol and as needed Toradol.  Still complaining of pain off and on.  PT recommends SNF.  Cervical myelopathy and lumbar spondylosis Being evaluated by orthopedics with plan on posterior cervical decompression  and fusion at [facility].   Hypothyroidism Continue Synthroid  Chronic depression Continue Prozac  Transaminitis Mild.  Statin on hold.  CK mildly elevated, now resolved.    DVT prophylaxis: Subcu Lovenox Code Status: Full code Family Communication: Husband involved in care Disposition:   Status is: Inpatient  Remains inpatient appropriate because:Unsafe d/c plan.  Patient unsafe for discharge home due to fall risk .  Needs SNF.   Dispo: The patient is from: Home              Anticipated d/c is to: SNF              Anticipated d/c date is: 1 day              Patient currently is medically stable to d/c.  Needs SNF.        Consultants:   None   Procedures: None   Antimicrobials: IV Rocephin   Subjective: Seen and examined.  Denies dysuria or hematuria.  Still having off-and-on pain in the knees.  Objective: Vitals:   08/03/19 0822 08/03/19 1601 08/03/19 2300 08/04/19 0726  BP: (!) 148/73 136/73 (!) 162/84 (!) 142/77  Pulse: 78 80 85 79  Resp: 16 16 16 16   Temp: 98.5 F (36.9 C) 97.9 F (36.6 C) 99.1 F (37.3 C) 98.4 F (36.9 C)  TempSrc: Oral Oral Oral Oral  SpO2: 97% 97% 96% 97%  Weight:      Height:        Intake/Output Summary (Last 24 hours) at 08/04/2019 1138 Last data filed at 08/04/2019 1019 Gross per 24 hour  Intake 680 ml  Output 800 ml  Net -  120 ml   Filed Weights   08/01/19 0746  Weight: 83.5 kg   Physical exam Not in distress HEENT: Moist mucosa, supple neck Chest: Clear CVs: Normal S1-S2 GI: Soft, nontender Musculoskeletal: Tenderness to pressure over bilateral knees.   Data Reviewed: I have personally reviewed following labs and imaging studies  CBC: Recent Labs  Lab 07/28/19 1139 08/01/19 0759 08/02/19 0632  WBC 6.1 4.6 4.1  HGB 13.0 12.5 11.2*  HCT 41.5 39.7 35.9*  MCV 88.7 86.9 88.6  PLT 302 287 245    Basic Metabolic Panel: Recent Labs  Lab 07/28/19 1139 08/01/19 0759 08/02/19 0632 08/03/19 0415  NA 139  137 140 137  K 3.7 3.6 4.3 3.6  CL 105 106 108 110  CO2 25 22 25  21*  GLUCOSE 92 101* 97 92  BUN 13 11 10 8   CREATININE 1.10* 0.99 0.93 0.92  CALCIUM 9.0 8.8* 8.1* 7.9*    GFR: Estimated Creatinine Clearance: 62.9 mL/min (by C-G formula based on SCr of 0.92 mg/dL).  Liver Function Tests: Recent Labs  Lab 07/28/19 1139 08/01/19 0759 08/03/19 0415  AST 24 76* 62*  ALT 25 77* 64*  ALKPHOS 80 75 60  BILITOT 0.6 0.6 0.6  PROT 7.0 6.7 5.4*  ALBUMIN 4.0 3.7 3.0*    CBG: No results for input(s): GLUCAP in the last 168 hours.   Recent Results (from the past 240 hour(s))  Urine culture     Status: Abnormal   Collection Time: 07/28/19 11:39 AM   Specimen: Urine, Random  Result Value Ref Range Status   Specimen Description   Final    URINE, RANDOM Performed at [facility], [address] [place], [address] [place]    Special Requests   Final    NONE Performed at [facility], [address] [place], [address] [place]    Culture >=100,000 COLONIES/mL CITROBACTER FREUNDII (A)  Final   Report Status 07/30/2019 FINAL  Final   Organism ID, Bacteria CITROBACTER FREUNDII (A)  Final      Susceptibility   Citrobacter freundii - MIC*    CEFAZOLIN >=64 RESISTANT Resistant     CEFTRIAXONE <=1 SENSITIVE Sensitive     CIPROFLOXACIN <=0.25 SENSITIVE Sensitive     GENTAMICIN <=1 SENSITIVE Sensitive     IMIPENEM 1 SENSITIVE Sensitive     NITROFURANTOIN <=16 SENSITIVE Sensitive     TRIMETH/SULFA <=20 SENSITIVE Sensitive     PIP/TAZO <=4 SENSITIVE Sensitive     * >=100,000 COLONIES/mL CITROBACTER FREUNDII  Respiratory Panel by RT PCR (Flu A&B, Covid) - Nasopharyngeal Swab     Status: None   Collection Time: 08/01/19 11:21 AM   Specimen: Nasopharyngeal Swab  Result Value Ref Range Status   SARS Coronavirus 2 by RT PCR NEGATIVE NEGATIVE Final    Comment: (NOTE) SARS-CoV-2 target nucleic acids are NOT DETECTED. The SARS-CoV-2 RNA is generally detectable in upper  respiratoy specimens during the acute phase of infection. The lowest concentration of SARS-CoV-2 viral copies this assay can detect is 131 copies/mL. A negative result does not preclude SARS-Cov-2 infection and should not be used as the sole basis for treatment or other patient management decisions. A negative result may occur with  improper specimen collection/handling, submission of specimen other than nasopharyngeal swab, presence of viral mutation(s) within the areas targeted by this assay, and inadequate number of viral copies (<131 copies/mL). A negative result must be combined with clinical observations, patient history, and epidemiological information. The expected result is Negative.  Fact Sheet for Patients:  https://www.moore.com/ Fact Sheet for Healthcare Providers:  https://www.young.biz/ This test is not yet ap proved or cleared by the [place] FDA and  has been authorized for detection and/or diagnosis of SARS-CoV-2 by FDA under an Emergency Use Authorization (EUA). This EUA will remain  in effect (meaning this test can be used) for the duration of the COVID-19 declaration under Section 564(b)(1) of the Act, 21 U.S.C. section 360bbb-3(b)(1), unless the authorization is terminated or revoked sooner.    Influenza A by PCR NEGATIVE NEGATIVE Final   Influenza B by PCR NEGATIVE NEGATIVE Final    Comment: (NOTE) The Xpert Xpress SARS-CoV-2/FLU/RSV assay is intended as an aid in  the diagnosis of influenza from Nasopharyngeal swab specimens and  should not be used as a sole basis for treatment. Nasal washings and  aspirates are unacceptable for Xpert Xpress SARS-CoV-2/FLU/RSV  testing. Fact Sheet for Patients: https://www.moore.com/ Fact Sheet for Healthcare Providers: https://www.young.biz/ This test is not yet approved or cleared by the [place] FDA and  has been authorized for  detection and/or diagnosis of SARS-CoV-2 by  FDA under an Emergency Use Authorization (EUA). This EUA will remain  in effect (meaning this test can be used) for the duration of the  Covid-19 declaration under Section 564(b)(1) of the Act, 21  U.S.C. section 360bbb-3(b)(1), unless the authorization is  terminated or revoked. Performed at [facility], [address] [place], [place] [address]          Radiology Studies: DG Knee 1-2 Views Left  Result Date: 08/02/2019 CLINICAL DATA:  , No known injury EXAM: LEFT KNEE - 1-2 VIEW COMPARISON:  None FINDINGS: Knee flexed on both images. Osseous mineralization normal. Joint spaces preserved. No acute fracture, dislocation, bone destruction or joint effusion seen. IMPRESSION: No acute osseous abnormalities. Electronically Signed   By: Ulyses Southward M.D.   On: 08/02/2019 13:31   DG Knee 1-2 Views Right  Result Date: 08/02/2019 CLINICAL DATA:  RIGHT knee pain today.  Initial encounter. EXAM: RIGHT KNEE - 1-2 VIEW COMPARISON:  None. FINDINGS: A small linear calcification along the MEDIAL femoral condyle appears remote and likely related to prior MCL injury. Correlate with pain. No other fracture, subluxation or dislocation. No joint effusion noted. IMPRESSION: Small linear calcification along the MEDIAL femoral condyle appears remote and likely related to prior MCL injury. Correlate with pain. No other significant abnormalities. Electronically Signed   By: Harmon Pier M.D.   On: 08/02/2019 13:32        Scheduled Meds: . acetaminophen  1,000 mg Oral Q8H  . enoxaparin (LOVENOX) injection  40 mg Subcutaneous Q24H  . FLUoxetine  10 mg Oral Daily  . gabapentin  300 mg Oral QHS  . levothyroxine  75 mcg Oral Q0600  . melatonin  2.5 mg Oral QHS  . multivitamin with minerals  1 tablet Oral Daily   Continuous Infusions: . sodium chloride 500 mL (08/04/19 1117)  . cefTRIAXone (ROCEPHIN)  IV 1 g (08/04/19 1128)     LOS: 3 days    Time  spent: 25 minutes    Xion Debruyne, MD [facility]   To contact the attending provider between 7A-7P or the covering provider during after hours 7P-7A, please log into the web site [email or web address] and access using universal [place] password for that web site. If you do not have the password, please call the hospital operator.  08/04/2019, 11:38 AM

## 2019-08-04 NOTE — Plan of Care (Signed)

## 2019-08-05 DIAGNOSIS — R7401 Elevation of levels of liver transaminase levels: Secondary | ICD-10-CM

## 2019-08-05 DIAGNOSIS — M179 Osteoarthritis of knee, unspecified: Secondary | ICD-10-CM | POA: Diagnosis present

## 2019-08-05 DIAGNOSIS — G959 Disease of spinal cord, unspecified: Secondary | ICD-10-CM | POA: Diagnosis present

## 2019-08-05 DIAGNOSIS — R531 Weakness: Secondary | ICD-10-CM

## 2019-08-05 DIAGNOSIS — M1710 Unilateral primary osteoarthritis, unspecified knee: Secondary | ICD-10-CM | POA: Diagnosis present

## 2019-08-05 LAB — RESPIRATORY PANEL BY RT PCR (FLU A&B, COVID)
Influenza A by PCR: NEGATIVE
Influenza B by PCR: NEGATIVE
SARS Coronavirus 2 by RT PCR: NEGATIVE

## 2019-08-05 MED ORDER — TRAMADOL HCL 50 MG PO TABS: 50.0000 mg | ORAL_TABLET | Freq: Three times a day (TID) | ORAL | 0 refills | Status: AC | PRN

## 2019-08-05 MED ORDER — ACETAMINOPHEN 500 MG PO TABS: 1000.0000 mg | ORAL_TABLET | Freq: Three times a day (TID) | ORAL | 0 refills | Status: AC

## 2019-08-05 MED ORDER — NITROFURANTOIN MONOHYD MACRO 100 MG PO CAPS
100.0000 mg | ORAL_CAPSULE | Freq: Two times a day (BID) | ORAL | 0 refills | Status: DC
Start: 2019-08-06 — End: 2019-08-09

## 2019-08-05 NOTE — Progress Notes (Addendum)
Physical Therapy Treatment Patient Details Name: Morgan Carpenter MRN: [id number] DOB: April 17, 1950 Today's Date: 08/05/2019    History of Present Illness Morgan Carpenter is a 69 y.o. female with medical history significant for neurosarcoidosis, adrenal insufficiency, degenerative disc disease involving the lumbar and cervical spine, osteoarthritis, history of DVT, hypopituitarism and obstructive sleep apnea who presents to the emergency room for evaluation of bilateral knee pain and inability to walk.  Pt recently dicharged from [facility] where she spent 3 weeks in rehab.  MD assessment includes: Complicated UTI with hematuria, hypothyroidism, sarcoidosis, and transaminitis.    PT Comments    Pt in bed upon entry positioned in hook-lying, asks author to remove pillow from left knee, but there is no pillow in place. Pt unable to slide LLE out of hip/knee flexion, even with assistance, very rigid, although she is able to increase flexion more. Pt appears to have more limitation from Left hamstring spasm, made worse byt severe weakness of Left quads. Attempts to passively obtain more knee extension resolves in Left ankle myoclonus and increased knee pain. Pt able to perform same exercises on right side with physical assist, less fatigue susceptibility. Grips bilat strong with delayed force production on right which patient attributes to old CVA. Pt has mild to moderate anomia in session, pt also attributes to old CVA. When asked regarding Left knee rigidity and left ankle myoclonus, she reports these new to this admission. MD made aware of potential for new neurological changes this date, no imaging done in our system, but review of ortho PA note from March 2021 admission shows 4/5 strength in LLE, no documented ROM restrictions or myoclonus. Will continue to follow.  Per [facility] note: "Motor strength is 4/5 in left hip flexion, knee extension, knee flexion, dorsiflexion, plantar flexion, EHL. Motor strength  is 5/5 in right hip flexion, knee extension, knee flexion, dorsiflexion, plantar flexion, EHL. She is unable to stand from a sitting position without difficulty. Sensation is intact to light touch. She has Hoffmann sign (left>right)" Tiburcio Pea Martie Round PA 06/24/2019)   Follow Up Recommendations  SNF     Equipment Recommendations  None recommended by PT    Recommendations for Other Services       Precautions / Restrictions Precautions Precautions: Fall Restrictions Weight Bearing Restrictions: No    Mobility  Bed Mobility               General bed mobility comments: deferred due to new neurological signs in session  Transfers                    Ambulation/Gait                 Stairs             Wheelchair Mobility    Modified Rankin (Stroke Patients Only)       Balance                                            Cognition Arousal/Alertness: Awake/alert Behavior During Therapy: WFL for tasks assessed/performed Overall Cognitive Status: Within Functional Limits for tasks assessed                                        Exercises General Exercises -  Lower Extremity Long Arc Quad: AAROM;AROM;Both;15 reps;Limitations Long Arc Quad Limitations: A/ROM x15 on Right, Left mor elimited in range and fatigue after 7x; difficult to isolate knee extension and hip flexion on left Heel Slides: AAROM;Both;Limitations Heel Slides Limitations: unable to fully extend knee on left, limited by ~30degrees, questionablly due to hamstrings spasticity Hip Flexion/Marching: AAROM;Both;10 reps;Limitations Hip Flexion/Marching Limitations: very weak on left    General Comments        Pertinent Vitals/Pain Pain Assessment: No/denies pain    Home Living                      Prior Function            PT Goals (current goals can now be found in the care plan section) Acute Rehab PT Goals Patient Stated Goal:  Walk without knee pain PT Goal Formulation: With patient Time For Goal Achievement: 08/14/19 Potential to Achieve Goals: Poor Progress towards PT goals: Not progressing toward goals - comment    Frequency    Min 2X/week      PT Plan Current plan remains appropriate    Co-evaluation              AM-PAC PT "6 Clicks" Mobility   Outcome Measure  Help needed turning from your back to your side while in a flat bed without using bedrails?: A Little Help needed moving from lying on your back to sitting on the side of a flat bed without using bedrails?: A Little Help needed moving to and from a bed to a chair (including a wheelchair)?: Total Help needed standing up from a chair using your arms (e.g., wheelchair or bedside chair)?: Total Help needed to walk in hospital room?: Total Help needed climbing 3-5 steps with a railing? : Total 6 Click Score: 10    End of Session   Activity Tolerance: Treatment limited secondary to medical complications (Comment);Patient limited by fatigue;No increased pain Patient left: in bed;with call bell/phone within reach;with bed alarm set Nurse Communication: Mobility status PT Visit Diagnosis: Muscle weakness (generalized) (M62.81);Pain Pain - Right/Left: Left Pain - part of body: Knee     Time: 0350-0938 PT Time Calculation (min) (ACUTE ONLY): 15 min  Charges:  $Therapeutic Exercise: 8-22 mins                     4:35 PM, 08/05/19 Etta Grandchild, PT, DPT Physical Therapist - [facility]  [phone] ([place])    [place] C 08/05/2019, 4:30 PM

## 2019-08-05 NOTE — TOC Progression Note (Addendum)
Transition of Care [facility]) - Progression Note    Patient Details  Name: Morgan Carpenter MRN: [id number] Date of Birth: 01/19/1951  Transition of Care [facility]) CM/SW [place], LCSW Phone Number: 08/05/2019, 11:42 AM  Clinical Narrative:   Per MD, patient ready for discharge to SNF. COVID test from today is negative. CSW met with patient at bedside. Patient reported she is agreeable to SNF, but does not want to go back to [facility]. Patient reported her first choice would be [facility]. CSW will reach out to [place] at [facility]. Informed patient that she does have a bed offer at [facility]. Patient reported she would be agreeable to [facility] if [facility] cannot accommodate her.  CSW called [facility] who reported they cannot accept patient.   CSW called into room to update patient. Patient agreeable to accept bed offer at [facility].  CSW called [facility] Representative Tammy to accept bed offer, pending insurance authorization. Tammy verbalized understanding.  CSW called [facility]. Started Ship broker for [employer]. Reference #: [id number]  Faxed clinicals for auth.      Expected Discharge Plan: Home/Self Care Barriers to Discharge: Continued Medical Work up  Expected Discharge Plan and Services Expected Discharge Plan: Home/Self Care In-house Referral: Clinical Social Work     Living arrangements for the past 2 months: Single Family Home                                       Social Determinants of Health (SDOH) Interventions    Readmission Risk Interventions No flowsheet data found.

## 2019-08-05 NOTE — Discharge Summary (Signed)
Physician Discharge Summary  Morgan Carpenter OQH:[id number] DOB: [birth year] DOA: 08/01/2019  PCP: Michail Sermon, MD  Admit date: 08/01/2019 Discharge date: 08/05/2019  Admitted From: Home Disposition: SNF  Recommendations for Outpatient Follow-up:  Patient will be discharged on oral nitrofurantoin to complete 7-day course of antibiotic until 5/6. Follow-up with MD at SNF in 1 week Equipment/Devices: As per therapy at the facility  Discharge Condition: Fair CODE STATUS: Full code Diet recommendation: Regular   Discharge Diagnoses:  Principal Problem:   Complicated UTI (urinary tract infection)   Active Problems:   Osteoarthritis of knee Hypothyroidism   Sarcoidosis   OSA (obstructive sleep apnea)   Transaminitis Lower extremity weakness   Cervical myelopathy (HCC)  Brief narrative/HPI 69 year old female with neurosarcoidosis, adrenal insufficiency, chronic left MCA stroke, DJD involving lumbar and cervical spine, osteoarthritis, history of DVT and hypopituitarism along with obstructive sleep apnea presented with bilateral knee pain (more prominent on the left) with inability to walk.  Symptoms started about 2 days prior to admission with swelling and tenderness but no fever.  She was discharged from short-term rehab on 4/23.  She was seen by orthopedics 1 month back regarding her low back pain and imaging suggested cervical myelopathy and lumbar stenosis/spondylolisthesis with plan on C3-C5 PCDF at [facility]. Per husband patient has difficulty using a rolling walker for the past few days prior to admission after sustaining a fall at home.  She was seen in the ED where she was found to have UTI and discharged home on Omnicef.  However patient started developing diarrhea while on antibiotic and also had dysuria with increased urinary frequency. UA in the ED showed significant hematuria with culture growing Citrobacter freundii.  Placed on IV Rocephin and admitted to medical floor  Hospital  course   Principal problem   Complicated UTI (urinary tract infection) With associated hematuria and intolerant to p.o. antibiotic.  Culture growing Citrobacter.    Received IV Rocephin.  Dysuria and hematuria now resolved.  Based on sensitivity will discharge her on oral nitrofurantoin for 2 more days to complete 7-day course of antibiotic.  Active problems Bilateral knee pain (L >R) Reports symptoms to be new with severe pain and fall.  X-rays of the knee shows degenerative changes only, no fractures or effusion. Pain tolerable on scheduled Tylenol and as needed IV Toradol.    Patient gets hives with p.o. Naprosyn so will add tramadol as needed for pain. Seen by PT and given significant pain and weakness recommend SNF.  Cervical myelopathy and lumbar spondylosis Being evaluated by orthopedics with plan on posterior cervical decompression and fusion at [facility].   Hypothyroidism Continue Synthroid  Chronic depression Continue Prozac  Transaminitis Mild.  Statin on hold.  CK mildly elevated, now resolved.  Ultrasound abdomen shows fatty changes only.  Resume statin upon discharge.  Procedure: None Family communication: None Disposition: SNF     Discharge Instructions   Allergies as of 08/05/2019      Reactions   Naprosyn [naproxen] Hives   Shellfish Allergy Hives   Sulfa Antibiotics Hives      Medication List    TAKE these medications   acetaminophen 500 MG tablet Commonly known as: TYLENOL Take 2 tablets (1,000 mg total) by mouth every 8 (eight) hours.   atorvastatin 40 MG tablet Commonly known as: LIPITOR Take 40 mg by mouth daily.   FLUoxetine 10 MG capsule Commonly known as: PROZAC Take 10 mg by mouth daily.   gabapentin 300 MG capsule Commonly known  as: NEURONTIN PLEASE SEE ATTACHED FOR DETAILED DIRECTIONS   levothyroxine 75 MCG tablet Commonly known as: SYNTHROID PLEASE SEE ATTACHED FOR DETAILED DIRECTIONS   multivitamin capsule Take by  mouth.   nitrofurantoin (macrocrystal-monohydrate) 100 MG capsule Commonly known as: Macrobid Take 1 capsule (100 mg total) by mouth 2 (two) times daily for 2 days. Start taking on: Aug 06, 2019   tiZANidine 2 MG tablet Commonly known as: ZANAFLEX Take 2 mg by mouth at bedtime as needed.   traMADol 50 MG tablet Commonly known as: Ultram Take 1 tablet (50 mg total) by mouth every 8 (eight) hours as needed for up to 5 days.      Follow-up Information    MD at SNF in 1week Follow up.          Allergies  Allergen Reactions  . Naprosyn [Naproxen] Hives  . Shellfish Allergy Hives  . Sulfa Antibiotics Hives      Procedures/Studies: DG Knee 1-2 Views Left  Result Date: 08/02/2019 CLINICAL DATA:  , No known injury EXAM: LEFT KNEE - 1-2 VIEW COMPARISON:  None FINDINGS: Knee flexed on both images. Osseous mineralization normal. Joint spaces preserved. No acute fracture, dislocation, bone destruction or joint effusion seen. IMPRESSION: No acute osseous abnormalities. Electronically Signed   By: Ulyses Southward M.D.   On: 08/02/2019 13:31   DG Knee 1-2 Views Right  Result Date: 08/02/2019 CLINICAL DATA:  RIGHT knee pain today.  Initial encounter. EXAM: RIGHT KNEE - 1-2 VIEW COMPARISON:  None. FINDINGS: A small linear calcification along the MEDIAL femoral condyle appears remote and likely related to prior MCL injury. Correlate with pain. No other fracture, subluxation or dislocation. No joint effusion noted. IMPRESSION: Small linear calcification along the MEDIAL femoral condyle appears remote and likely related to prior MCL injury. Correlate with pain. No other significant abnormalities. Electronically Signed   By: Harmon Pier M.D.   On: 08/02/2019 13:32   US Venous Img Lower Bilateral  Result Date: 08/01/2019 CLINICAL DATA:  Cramping behind the knees bilaterally for 2 days, decreased mobility. Also reportedly with prior DVT on anticoagulation. EXAM: Bilateral LOWER EXTREMITY VENOUS DOPPLER  ULTRASOUND TECHNIQUE: Gray-scale sonography with compression, as well as color and duplex ultrasound, were performed to evaluate the deep venous system(s) from the level of the common femoral vein through the popliteal and proximal calf veins. COMPARISON:  None FINDINGS: VENOUS Normal compressibility of the common femoral, superficial femoral, and popliteal veins, as well as the visualized calf veins. Visualized portions of profunda femoral vein and great saphenous vein unremarkable. No filling defects to suggest DVT on grayscale or color Doppler imaging. Doppler waveforms show normal direction of venous flow, normal respiratory plasticity and response to augmentation. OTHER None. Limitations: Limited visualization of the bilateral calf pain, specifically peroneal vein IMPRESSION: Negative for DVT in the bilateral lower extremities. Mildly limited assessment of the calf as described. Electronically Signed   By: Donzetta Kohut M.D.   On: 08/01/2019 09:45   DG Chest Port 1 View  Result Date: 08/01/2019 CLINICAL DATA:  Weakness. EXAM: PORTABLE CHEST 1 VIEW COMPARISON:  None. FINDINGS: 0833 hours. The lungs are clear without focal pneumonia, edema, pneumothorax or pleural effusion. The cardiopericardial silhouette is within normal limits for size. The visualized bony structures of the thorax are intact. IMPRESSION: No active disease. Electronically Signed   By: Kennith Center M.D.   On: 08/01/2019 08:59   US Abdomen Limited RUQ  Result Date: 08/02/2019 CLINICAL DATA:  Transaminitis EXAM: ULTRASOUND ABDOMEN  LIMITED RIGHT UPPER QUADRANT COMPARISON:  None. FINDINGS: Gallbladder: Prior cholecystectomy Common bile duct: Diameter: Normal caliber, 4 mm. Liver: Increased echotexture compatible with fatty infiltration. No focal abnormality or biliary ductal dilatation. Portal vein is patent on color Doppler imaging with normal direction of blood flow towards the liver. Other: None. IMPRESSION: Fatty infiltration of the  liver. Prior cholecystectomy. Electronically Signed   By: Rolm Baptise M.D.   On: 08/02/2019 09:11     Subjective: Seen and examined.  Pain in the knee is better  Discharge Exam: Vitals:   08/04/19 2310 08/05/19 0842  BP: (!) 154/84 132/74  Pulse: 82 78  Resp: 16 18  Temp: 97.9 F (36.6 C) 98.3 F (36.8 C)  SpO2: 97% 95%   Vitals:   08/04/19 1537 08/04/19 2108 08/04/19 2310 08/05/19 0842  BP: (!) 149/83 (!) 141/80 (!) 154/84 132/74  Pulse: 82 75 82 78  Resp: 20 16 16 18   Temp: (!) 97.4 F (36.3 C) 98.7 F (37.1 C) 97.9 F (36.6 C) 98.3 F (36.8 C)  TempSrc: Oral Oral Oral Oral  SpO2: 96% 100% 97% 95%  Weight:      Height:        General: Elderly female not in distress HEENT: Moist with a, supple neck Chest: Clear bilaterally CVS: Normal S1-S2 GI: Soft, nondistended, nontender Musculoskeletal: Warm, no edema, minimal tenderness over left knee    The results of significant diagnostics from this hospitalization (including imaging, microbiology, ancillary and laboratory) are listed below for reference.     Microbiology: Recent Results (from the past 240 hour(s))  Urine culture     Status: Abnormal   Collection Time: 07/28/19 11:39 AM   Specimen: Urine, Random  Result Value Ref Range Status   Specimen Description   Final    URINE, RANDOM Performed at [facility] [address] [place], [place] [address]    Special Requests   Final    NONE Performed at [facility], [address] [place], [place] [address]    Culture >=100,000 COLONIES/mL CITROBACTER FREUNDII (A)  Final   Report Status 07/30/2019 FINAL  Final   Organism ID, Bacteria CITROBACTER FREUNDII (A)  Final      Susceptibility   Citrobacter freundii - MIC*    CEFAZOLIN >=64 RESISTANT Resistant     CEFTRIAXONE <=1 SENSITIVE Sensitive     CIPROFLOXACIN <=0.25 SENSITIVE Sensitive     GENTAMICIN <=1 SENSITIVE Sensitive     IMIPENEM 1 SENSITIVE Sensitive     NITROFURANTOIN  <=16 SENSITIVE Sensitive     TRIMETH/SULFA <=20 SENSITIVE Sensitive     PIP/TAZO <=4 SENSITIVE Sensitive     * >=100,000 COLONIES/mL CITROBACTER FREUNDII  Respiratory Panel by RT PCR (Flu A&B, Covid) - Nasopharyngeal Swab     Status: None   Collection Time: 08/01/19 11:21 AM   Specimen: Nasopharyngeal Swab  Result Value Ref Range Status   SARS Coronavirus 2 by RT PCR NEGATIVE NEGATIVE Final    Comment: (NOTE) SARS-CoV-2 target nucleic acids are NOT DETECTED. The SARS-CoV-2 RNA is generally detectable in upper respiratoy specimens during the acute phase of infection. The lowest concentration of SARS-CoV-2 viral copies this assay can detect is 131 copies/mL. A negative result does not preclude SARS-Cov-2 infection and should not be used as the sole basis for treatment or other patient management decisions. A negative result may occur with  improper specimen collection/handling, submission of specimen other than nasopharyngeal swab, presence of viral mutation(s) within the areas targeted by  this assay, and inadequate number of viral copies (<131 copies/mL). A negative result must be combined with clinical observations, patient history, and epidemiological information. The expected result is Negative. Fact Sheet for Patients:  https://www.moore.com/ Fact Sheet for Healthcare Providers:  https://www.young.biz/ This test is not yet ap proved or cleared by the [place] FDA and  has been authorized for detection and/or diagnosis of SARS-CoV-2 by FDA under an Emergency Use Authorization (EUA). This EUA will remain  in effect (meaning this test can be used) for the duration of the COVID-19 declaration under Section 564(b)(1) of the Act, 21 U.S.C. section 360bbb-3(b)(1), unless the authorization is terminated or revoked sooner.    Influenza A by PCR NEGATIVE NEGATIVE Final   Influenza B by PCR NEGATIVE NEGATIVE Final    Comment: (NOTE) The Xpert  Xpress SARS-CoV-2/FLU/RSV assay is intended as an aid in  the diagnosis of influenza from Nasopharyngeal swab specimens and  should not be used as a sole basis for treatment. Nasal washings and  aspirates are unacceptable for Xpert Xpress SARS-CoV-2/FLU/RSV  testing. Fact Sheet for Patients: https://www.moore.com/ Fact Sheet for Healthcare Providers: https://www.young.biz/ This test is not yet approved or cleared by the [place] FDA and  has been authorized for detection and/or diagnosis of SARS-CoV-2 by  FDA under an Emergency Use Authorization (EUA). This EUA will remain  in effect (meaning this test can be used) for the duration of the  Covid-19 declaration under Section 564(b)(1) of the Act, 21  U.S.C. section 360bbb-3(b)(1), unless the authorization is  terminated or revoked. Performed at [facility], [address], [place], [place] [address]   Respiratory Panel by RT PCR (Flu A&B, Covid) - Nasopharyngeal Swab     Status: None   Collection Time: 08/05/19  8:25 AM   Specimen: Nasopharyngeal Swab  Result Value Ref Range Status   SARS Coronavirus 2 by RT PCR NEGATIVE NEGATIVE Final    Comment: (NOTE) SARS-CoV-2 target nucleic acids are NOT DETECTED. The SARS-CoV-2 RNA is generally detectable in upper respiratoy specimens during the acute phase of infection. The lowest concentration of SARS-CoV-2 viral copies this assay can detect is 131 copies/mL. A negative result does not preclude SARS-Cov-2 infection and should not be used as the sole basis for treatment or other patient management decisions. A negative result may occur with  improper specimen collection/handling, submission of specimen other than nasopharyngeal swab, presence of viral mutation(s) within the areas targeted by this assay, and inadequate number of viral copies (<131 copies/mL). A negative result must be combined with clinical observations, patient history,  and epidemiological information. The expected result is Negative. Fact Sheet for Patients:  https://www.moore.com/ Fact Sheet for Healthcare Providers:  https://www.young.biz/ This test is not yet ap proved or cleared by the [place] FDA and  has been authorized for detection and/or diagnosis of SARS-CoV-2 by FDA under an Emergency Use Authorization (EUA). This EUA will remain  in effect (meaning this test can be used) for the duration of the COVID-19 declaration under Section 564(b)(1) of the Act, 21 U.S.C. section 360bbb-3(b)(1), unless the authorization is terminated or revoked sooner.    Influenza A by PCR NEGATIVE NEGATIVE Final   Influenza B by PCR NEGATIVE NEGATIVE Final    Comment: (NOTE) The Xpert Xpress SARS-CoV-2/FLU/RSV assay is intended as an aid in  the diagnosis of influenza from Nasopharyngeal swab specimens and  should not be used as a sole basis for treatment. Nasal washings and  aspirates are unacceptable for Xpert Xpress  SARS-CoV-2/FLU/RSV  testing. Fact Sheet for Patients: https://www.moore.com/ Fact Sheet for Healthcare Providers: https://www.young.biz/ This test is not yet approved or cleared by the [place] FDA and  has been authorized for detection and/or diagnosis of SARS-CoV-2 by  FDA under an Emergency Use Authorization (EUA). This EUA will remain  in effect (meaning this test can be used) for the duration of the  Covid-19 declaration under Section 564(b)(1) of the Act, 21  U.S.C. section 360bbb-3(b)(1), unless the authorization is  terminated or revoked. Performed at [facility], [address] [place], [place] [address]      Labs: BNP (last 3 results) No results for input(s): BNP in the last 8760 hours. Basic Metabolic Panel: Recent Labs  Lab 08/01/19 0759 08/02/19 0632 08/03/19 0415  NA 137 140 137  K 3.6 4.3 3.6  CL 106 108 110  CO2 22 25  21*  GLUCOSE 101* 97 92  BUN 11 10 8   CREATININE 0.99 0.93 0.92  CALCIUM 8.8* 8.1* 7.9*   Liver Function Tests: Recent Labs  Lab 08/01/19 0759 08/03/19 0415  AST 76* 62*  ALT 77* 64*  ALKPHOS 75 60  BILITOT 0.6 0.6  PROT 6.7 5.4*  ALBUMIN 3.7 3.0*   No results for input(s): LIPASE, AMYLASE in the last 168 hours. No results for input(s): AMMONIA in the last 168 hours. CBC: Recent Labs  Lab 08/01/19 0759 08/02/19 0632  WBC 4.6 4.1  HGB 12.5 11.2*  HCT 39.7 35.9*  MCV 86.9 88.6  PLT 287 245   Cardiac Enzymes: Recent Labs  Lab 08/01/19 0759 08/03/19 0415  CKTOTAL 238* 213   BNP: Invalid input(s): POCBNP CBG: No results for input(s): GLUCAP in the last 168 hours. D-Dimer No results for input(s): DDIMER in the last 72 hours. Hgb A1c No results for input(s): HGBA1C in the last 72 hours. Lipid Profile No results for input(s): CHOL, HDL, LDLCALC, TRIG, CHOLHDL, LDLDIRECT in the last 72 hours. Thyroid function studies No results for input(s): TSH, T4TOTAL, T3FREE, THYROIDAB in the last 72 hours.  Invalid input(s): FREET3 Anemia work up No results for input(s): VITAMINB12, FOLATE, FERRITIN, TIBC, IRON, RETICCTPCT in the last 72 hours. Urinalysis    Component Value Date/Time   COLORURINE RED (A) 08/01/2019 0829   APPEARANCEUR TURBID (A) 08/01/2019 0829   LABSPEC 1.030 08/01/2019 0829   PHURINE  08/01/2019 0829    TEST NOT REPORTED DUE TO COLOR INTERFERENCE OF URINE PIGMENT   GLUCOSEU (A) 08/01/2019 0829    TEST NOT REPORTED DUE TO COLOR INTERFERENCE OF URINE PIGMENT   HGBUR (A) 08/01/2019 0829    TEST NOT REPORTED DUE TO COLOR INTERFERENCE OF URINE PIGMENT   BILIRUBINUR (A) 08/01/2019 0829    TEST NOT REPORTED DUE TO COLOR INTERFERENCE OF URINE PIGMENT   KETONESUR (A) 08/01/2019 0829    TEST NOT REPORTED DUE TO COLOR INTERFERENCE OF URINE PIGMENT   PROTEINUR (A) 08/01/2019 0829    TEST NOT REPORTED DUE TO COLOR INTERFERENCE OF URINE PIGMENT   NITRITE (A)  08/01/2019 0829    TEST NOT REPORTED DUE TO COLOR INTERFERENCE OF URINE PIGMENT   LEUKOCYTESUR (A) 08/01/2019 0829    TEST NOT REPORTED DUE TO COLOR INTERFERENCE OF URINE PIGMENT   Sepsis Labs Invalid input(s): PROCALCITONIN,  WBC,  LACTICIDVEN Microbiology Recent Results (from the past 240 hour(s))  Urine culture     Status: Abnormal   Collection Time: 07/28/19 11:39 AM   Specimen: Urine, Random  Result Value Ref Range Status  Specimen Description   Final    URINE, RANDOM Performed at [facility], [address] [place], [place] [address]    Special Requests   Final    NONE Performed at [facility], [address] [place], [place] [address]    Culture >=100,000 COLONIES/mL CITROBACTER FREUNDII (A)  Final   Report Status 07/30/2019 FINAL  Final   Organism ID, Bacteria CITROBACTER FREUNDII (A)  Final      Susceptibility   Citrobacter freundii - MIC*    CEFAZOLIN >=64 RESISTANT Resistant     CEFTRIAXONE <=1 SENSITIVE Sensitive     CIPROFLOXACIN <=0.25 SENSITIVE Sensitive     GENTAMICIN <=1 SENSITIVE Sensitive     IMIPENEM 1 SENSITIVE Sensitive     NITROFURANTOIN <=16 SENSITIVE Sensitive     TRIMETH/SULFA <=20 SENSITIVE Sensitive     PIP/TAZO <=4 SENSITIVE Sensitive     * >=100,000 COLONIES/mL CITROBACTER FREUNDII  Respiratory Panel by RT PCR (Flu A&B, Covid) - Nasopharyngeal Swab     Status: None   Collection Time: 08/01/19 11:21 AM   Specimen: Nasopharyngeal Swab  Result Value Ref Range Status   SARS Coronavirus 2 by RT PCR NEGATIVE NEGATIVE Final    Comment: (NOTE) SARS-CoV-2 target nucleic acids are NOT DETECTED. The SARS-CoV-2 RNA is generally detectable in upper respiratoy specimens during the acute phase of infection. The lowest concentration of SARS-CoV-2 viral copies this assay can detect is 131 copies/mL. A negative result does not preclude SARS-Cov-2 infection and should not be used as the sole basis for treatment or other patient  management decisions. A negative result may occur with  improper specimen collection/handling, submission of specimen other than nasopharyngeal swab, presence of viral mutation(s) within the areas targeted by this assay, and inadequate number of viral copies (<131 copies/mL). A negative result must be combined with clinical observations, patient history, and epidemiological information. The expected result is Negative. Fact Sheet for Patients:  https://www.moore.com/https://www.fda.gov/media/142436/download Fact Sheet for Healthcare Providers:  https://www.young.biz/https://www.fda.gov/media/142435/download This test is not yet ap proved or cleared by the [place] FDA and  has been authorized for detection and/or diagnosis of SARS-CoV-2 by FDA under an Emergency Use Authorization (EUA). This EUA will remain  in effect (meaning this test can be used) for the duration of the COVID-19 declaration under Section 564(b)(1) of the Act, 21 U.S.C. section 360bbb-3(b)(1), unless the authorization is terminated or revoked sooner.    Influenza A by PCR NEGATIVE NEGATIVE Final   Influenza B by PCR NEGATIVE NEGATIVE Final    Comment: (NOTE) The Xpert Xpress SARS-CoV-2/FLU/RSV assay is intended as an aid in  the diagnosis of influenza from Nasopharyngeal swab specimens and  should not be used as a sole basis for treatment. Nasal washings and  aspirates are unacceptable for Xpert Xpress SARS-CoV-2/FLU/RSV  testing. Fact Sheet for Patients: https://www.moore.com/https://www.fda.gov/media/142436/download Fact Sheet for Healthcare Providers: https://www.young.biz/https://www.fda.gov/media/142435/download This test is not yet approved or cleared by the [place] FDA and  has been authorized for detection and/or diagnosis of SARS-CoV-2 by  FDA under an Emergency Use Authorization (EUA). This EUA will remain  in effect (meaning this test can be used) for the duration of the  Covid-19 declaration under Section 564(b)(1) of the Act, 21  U.S.C. section 360bbb-3(b)(1), unless the  authorization is  terminated or revoked. Performed at [facility], [address] [place], [place] [address]   Respiratory Panel by RT PCR (Flu A&B, Covid) - Nasopharyngeal Swab     Status: None   Collection  Time: 08/05/19  8:25 AM   Specimen: Nasopharyngeal Swab  Result Value Ref Range Status   SARS Coronavirus 2 by RT PCR NEGATIVE NEGATIVE Final    Comment: (NOTE) SARS-CoV-2 target nucleic acids are NOT DETECTED. The SARS-CoV-2 RNA is generally detectable in upper respiratoy specimens during the acute phase of infection. The lowest concentration of SARS-CoV-2 viral copies this assay can detect is 131 copies/mL. A negative result does not preclude SARS-Cov-2 infection and should not be used as the sole basis for treatment or other patient management decisions. A negative result may occur with  improper specimen collection/handling, submission of specimen other than nasopharyngeal swab, presence of viral mutation(s) within the areas targeted by this assay, and inadequate number of viral copies (<131 copies/mL). A negative result must be combined with clinical observations, patient history, and epidemiological information. The expected result is Negative. Fact Sheet for Patients:  https://www.moore.com/ Fact Sheet for Healthcare Providers:  https://www.young.biz/ This test is not yet ap proved or cleared by the [place] FDA and  has been authorized for detection and/or diagnosis of SARS-CoV-2 by FDA under an Emergency Use Authorization (EUA). This EUA will remain  in effect (meaning this test can be used) for the duration of the COVID-19 declaration under Section 564(b)(1) of the Act, 21 U.S.C. section 360bbb-3(b)(1), unless the authorization is terminated or revoked sooner.    Influenza A by PCR NEGATIVE NEGATIVE Final   Influenza B by PCR NEGATIVE NEGATIVE Final    Comment: (NOTE) The Xpert Xpress SARS-CoV-2/FLU/RSV assay  is intended as an aid in  the diagnosis of influenza from Nasopharyngeal swab specimens and  should not be used as a sole basis for treatment. Nasal washings and  aspirates are unacceptable for Xpert Xpress SARS-CoV-2/FLU/RSV  testing. Fact Sheet for Patients: https://www.moore.com/ Fact Sheet for Healthcare Providers: https://www.young.biz/ This test is not yet approved or cleared by the [place] FDA and  has been authorized for detection and/or diagnosis of SARS-CoV-2 by  FDA under an Emergency Use Authorization (EUA). This EUA will remain  in effect (meaning this test can be used) for the duration of the  Covid-19 declaration under Section 564(b)(1) of the Act, 21  U.S.C. section 360bbb-3(b)(1), unless the authorization is  terminated or revoked. Performed at [facility], [address] [place], [place] [address]      Time coordinating discharge: 35 minutes  SIGNED:   Eddie [place], MD  [facility] 08/05/2019, 3:32 PM Pager   If 7PM-7AM, please contact night-coverage [email or web address] Password [email or web address]

## 2019-08-05 NOTE — Care Management Important Message (Signed)
Important Message  Patient Details  Name: Morgan Carpenter MRN: [id number] Date of Birth: Jun 13, 1950   Medicare Important Message Given:  Yes     Olegario Messier A Daniella Dewberry 08/05/2019, 12:03 PM

## 2019-08-05 NOTE — TOC Progression Note (Addendum)
Transition of Care [facility]) - Progression Note    Patient Details  Name: KRIPA FOSKEY MRN: [id number] Date of Birth: Jan 06, 1951  Transition of Care [facility]) CM/SW Contact  Liliana Cline, LCSW Phone Number: 08/05/2019, 3:23 PM  Clinical Narrative:    3:15- CSW informed of insurance authorization confirmed for patient. CSW called and updated [employer] Administrator, sports. Per Tammy, if DC summary and orders are in by 4 they can accept patient today.    3:30- Received a call from [employer] Representative Tammy. Was informed by Tammy that patient is in her copay days so would have to pay $184/day in 7 day increments at a time. Spoke to the patient about this who deferred to her husband. CSW called patient's husband [name].   [name] had concerns about patient's care when she was at [facility]. CSW provided DHHS Complaint Number and encouraged [name] to follow up with them.   [name] reported they cannot afford the copay and he would like to bring patient home with Home Health services. [name] reported he does not want EMS transport home, he will come pick patient up. [name] reported they have DME at home including a BSC, walker, wheelchair, support bars, and bed rails. [name] reported patient was supposed to start with [facility] for services right before she came to the hospital this time. CSW left a voicemail for [facility] to follow-up if they can accept patient today.  CSW updated MD and RN about family now denying SNF due to copay. MD to order [facility] services.   4:00- CSW attempted call to [facility] Representative again. No answer. Sent a message requesting a call. Also sent a secure email requesting follow up to confirm if patient is active with them.  CSW called patient's husband [name] to follow up as RN has concerns about patient being able to get into his car. He says he will need to call me back he is on the phone with DHHS.  Called patient's  room. Patient is agreeable to home with Home Health. Patient said she thinks she would be able to get into her husband's car if he transports her home this evening.    4:15- Received a return call from patient's husband, [name]. [name] reported he just made a report a DHHS about their experience with [facility].   Talked to [name] about the safety concerns of getting patient into the car versus Nonemergent EMS transport. [name] reports he feels comfortable getting patient into the car and into to the house using her walker and transport wheelchair. He says he will come pick her up around 5:30 or 6:00 pm.    4:25- Received update from Dr. Gonzella Lex that patient's discharge today will be cancelled due to leg weakness and needing an MRI. Called patient's husband back and updated him. He verbalized understanding.  Expected Discharge Plan: Home/Self Care Barriers to Discharge: Continued Medical Work up  Expected Discharge Plan and Services Expected Discharge Plan: Home/Self Care In-house Referral: Clinical Social Work     Living arrangements for the past 2 months: Single Family Home                                       Social Determinants of Health (SDOH) Interventions    Readmission Risk Interventions No flowsheet data found.

## 2019-08-06 ENCOUNTER — Inpatient Hospital Stay: Payer: Medicare Other

## 2019-08-06 DIAGNOSIS — G959 Disease of spinal cord, unspecified: Secondary | ICD-10-CM

## 2019-08-06 MED ORDER — GADOBUTROL 1 MMOL/ML IV SOLN
8.0000 mL | Freq: Once | INTRAVENOUS | Status: AC | PRN
  Administered 2019-08-06: 8 mL via INTRAVENOUS

## 2019-08-06 NOTE — Progress Notes (Signed)
Patient has aneurysm coils in place from 2009 in [place]. I discussed with radiologist on call Dr Chase Picket this and that the patient has had prior MRI since, most recently at [facility] 06/27/2019 and Dr Chase Picket said patient is clear for MRI based on this information

## 2019-08-06 NOTE — Progress Notes (Signed)
PROGRESS NOTE    Morgan Carpenter  BMW:[id number] DOB: 26-Jul-1950 DOA: 08/01/2019 PCP: Michail Sermon, MD    Chief Complaint  Patient presents with  . Urinary Tract Infection    Brief Narrative:  69 year old female with neurosarcoidosis, adrenal insufficiency, chronic left MCA stroke, DJD involving lumbar and cervical spine, osteoarthritis, history of DVT and hypopituitarism along with obstructive sleep apnea presented with bilateral knee pain (more prominent on the left) with inability to walk. Symptoms started about 2 days prior to admission with swelling and tenderness but no fever. She was discharged from short-term rehab on 4/23. She was seen by orthopedics 1 month back regarding her low back pain and imaging suggested cervical myelopathy and lumbar stenosis/spondylolisthesis with plan on C3-C5 PCDF at [facility]. Per husband patient has difficulty using a rolling walker for the past few days prior to admission after sustaining a fall at home. She was seen in the ED where she was found to have UTI and discharged home on Omnicef. However patient started developing diarrhea while on antibiotic and also had dysuria with increased urinary frequency. UA in the ED showed significant hematuria with culture growing Citrobacter freundii. Placed on IV Rocephin and admitted to medical floor   Assessment & Plan:   Complicated UTI. Has received 6 days of antibiotics, discharged on 1 more day of oral nitrofurantoin.  Dysuria and hematuria resolved.  Cervical myelopathy and severe lumbar spondylosis/lumbar spines stenosis. Concern for increasing weakness of the left leg with?  Myoclonus on exam for which discharge was held yesterday.  I have obtained MRI of the lumbar spine with contrast which showed severe L3-L4 spinal canal stenosis with moderate bilateral neural foraminal stenosis.  It appears similar to MRI of the lumbar spine done and reported at [facility] 6 weeks back.  Discussed with  neurology who evaluated the patient and recommended this is likely due to the knee pain and does not appear to be acute central neurological process.  No further imaging needed.  Patient is being evaluated by orthopedics with plan on posterior cervical decompression done at [facility].  Informs that she has an appointment in 4 weeks.  Bilateral knee pain (L >R) with lower extremity weakness. Severe pain and fall at home.  X-ray of the knee showing degenerative changes without fracture or effusion.  Questionable meniscal injury.  Pain control with scheduled Tylenol, added tramadol as needed.   PT recommended SNF but patient and husband wanted to go home with home health.  Will arrange home health PT/OT.       Patient ready for discharge home with home health.          Subjective: Still having off-and-on pain in the knees.  Objective: Vitals:   08/05/19 0842 08/05/19 2342 08/06/19 0000 08/06/19 0729  BP: 132/74 (!) 171/91 (!) 157/81 (!) 141/87  Pulse: 78 89 83 85  Resp: 18 16  16   Temp: 98.3 F (36.8 C) 98.7 F (37.1 C)  98.8 F (37.1 C)  TempSrc: Oral Oral  Oral  SpO2: 95% 98%  97%  Weight:      Height:        Intake/Output Summary (Last 24 hours) at 08/06/2019 1511 Last data filed at 08/06/2019 1306 Gross per 24 hour  Intake 240 ml  Output 1025 ml  Net -785 ml   Filed Weights   08/01/19 0746  Weight: 83.5 kg    Examination:  General: Elderly female not in distress HEENT: Moist mucosa, supple neck Chest: Clear bilaterally  CVs: Normal S1-S2 GI: Soft, nondistended, nontender Musculoskeletal:4/5 over left lower leg with tenderness on the left knee, plantars equivocal bilaterally, no increased reflex or clonus noted  Data Reviewed: I have personally reviewed following labs and imaging studies  CBC: Recent Labs  Lab 08/01/19 0759 08/02/19 0632  WBC 4.6 4.1  HGB 12.5 11.2*  HCT 39.7 35.9*  MCV 86.9 88.6  PLT 287 245    Basic Metabolic Panel: Recent Labs    Lab 08/01/19 0759 08/02/19 0632 08/03/19 0415  NA 137 140 137  K 3.6 4.3 3.6  CL 106 108 110  CO2 22 25 21*  GLUCOSE 101* 97 92  BUN 11 10 8   CREATININE 0.99 0.93 0.92  CALCIUM 8.8* 8.1* 7.9*    GFR: Estimated Creatinine Clearance: 62.9 mL/min (by C-G formula based on SCr of 0.92 mg/dL).  Liver Function Tests: Recent Labs  Lab 08/01/19 0759 08/03/19 0415  AST 76* 62*  ALT 77* 64*  ALKPHOS 75 60  BILITOT 0.6 0.6  PROT 6.7 5.4*  ALBUMIN 3.7 3.0*    CBG: No results for input(s): GLUCAP in the last 168 hours.   Recent Results (from the past 240 hour(s))  Urine culture     Status: Abnormal   Collection Time: 07/28/19 11:39 AM   Specimen: Urine, Random  Result Value Ref Range Status   Specimen Description   Final    URINE, RANDOM Performed at [facility], [address], [place], [place]    Special Requests   Final    NONE Performed at [facility], [address] [place], [place]    Culture >=100,000 COLONIES/mL CITROBACTER FREUNDII (A)  Final   Report Status 07/30/2019 FINAL  Final   Organism ID, Bacteria CITROBACTER FREUNDII (A)  Final      Susceptibility   Citrobacter freundii - MIC*    CEFAZOLIN >=64 RESISTANT Resistant     CEFTRIAXONE <=1 SENSITIVE Sensitive     CIPROFLOXACIN <=0.25 SENSITIVE Sensitive     GENTAMICIN <=1 SENSITIVE Sensitive     IMIPENEM 1 SENSITIVE Sensitive     NITROFURANTOIN <=16 SENSITIVE Sensitive     TRIMETH/SULFA <=20 SENSITIVE Sensitive     PIP/TAZO <=4 SENSITIVE Sensitive     * >=100,000 COLONIES/mL CITROBACTER FREUNDII  Respiratory Panel by RT PCR (Flu A&B, Covid) - Nasopharyngeal Swab     Status: None   Collection Time: 08/01/19 11:21 AM   Specimen: Nasopharyngeal Swab  Result Value Ref Range Status   SARS Coronavirus 2 by RT PCR NEGATIVE NEGATIVE Final    Comment: (NOTE) SARS-CoV-2 target nucleic acids are NOT DETECTED. The SARS-CoV-2 RNA is generally detectable in upper  respiratoy specimens during the acute phase of infection. The lowest concentration of SARS-CoV-2 viral copies this assay can detect is 131 copies/mL. A negative result does not preclude SARS-Cov-2 infection and should not be used as the sole basis for treatment or other patient management decisions. A negative result may occur with  improper specimen collection/handling, submission of specimen other than nasopharyngeal swab, presence of viral mutation(s) within the areas targeted by this assay, and inadequate number of viral copies (<131 copies/mL). A negative result must be combined with clinical observations, patient history, and epidemiological information. The expected result is Negative. Fact Sheet for Patients:  08/03/19 Fact Sheet for Healthcare Providers:  https://www.moore.com/ This test is not yet ap proved or cleared by the https://www.young.biz/ FDA and  has been authorized for detection and/or diagnosis of SARS-CoV-2 by  FDA under an Emergency Use Authorization (EUA). This EUA will remain  in effect (meaning this test can be used) for the duration of the COVID-19 declaration under Section 564(b)(1) of the Act, 21 U.S.C. section 360bbb-3(b)(1), unless the authorization is terminated or revoked sooner.    Influenza A by PCR NEGATIVE NEGATIVE Final   Influenza B by PCR NEGATIVE NEGATIVE Final    Comment: (NOTE) The Xpert Xpress SARS-CoV-2/FLU/RSV assay is intended as an aid in  the diagnosis of influenza from Nasopharyngeal swab specimens and  should not be used as a sole basis for treatment. Nasal washings and  aspirates are unacceptable for Xpert Xpress SARS-CoV-2/FLU/RSV  testing. Fact Sheet for Patients: https://www.moore.com/ Fact Sheet for Healthcare Providers: https://www.young.biz/ This test is not yet approved or cleared by the [place] FDA and  has been authorized for  detection and/or diagnosis of SARS-CoV-2 by  FDA under an Emergency Use Authorization (EUA). This EUA will remain  in effect (meaning this test can be used) for the duration of the  Covid-19 declaration under Section 564(b)(1) of the Act, 21  U.S.C. section 360bbb-3(b)(1), unless the authorization is  terminated or revoked. Performed at [facility], [address], [place], [place] [address]   Respiratory Panel by RT PCR (Flu A&B, Covid) - Nasopharyngeal Swab     Status: None   Collection Time: 08/05/19  8:25 AM   Specimen: Nasopharyngeal Swab  Result Value Ref Range Status   SARS Coronavirus 2 by RT PCR NEGATIVE NEGATIVE Final    Comment: (NOTE) SARS-CoV-2 target nucleic acids are NOT DETECTED. The SARS-CoV-2 RNA is generally detectable in upper respiratoy specimens during the acute phase of infection. The lowest concentration of SARS-CoV-2 viral copies this assay can detect is 131 copies/mL. A negative result does not preclude SARS-Cov-2 infection and should not be used as the sole basis for treatment or other patient management decisions. A negative result may occur with  improper specimen collection/handling, submission of specimen other than nasopharyngeal swab, presence of viral mutation(s) within the areas targeted by this assay, and inadequate number of viral copies (<131 copies/mL). A negative result must be combined with clinical observations, patient history, and epidemiological information. The expected result is Negative. Fact Sheet for Patients:  https://www.moore.com/ Fact Sheet for Healthcare Providers:  https://www.young.biz/ This test is not yet ap proved or cleared by the [place] FDA and  has been authorized for detection and/or diagnosis of SARS-CoV-2 by FDA under an Emergency Use Authorization (EUA). This EUA will remain  in effect (meaning this test can be used) for the duration of the COVID-19  declaration under Section 564(b)(1) of the Act, 21 U.S.C. section 360bbb-3(b)(1), unless the authorization is terminated or revoked sooner.    Influenza A by PCR NEGATIVE NEGATIVE Final   Influenza B by PCR NEGATIVE NEGATIVE Final    Comment: (NOTE) The Xpert Xpress SARS-CoV-2/FLU/RSV assay is intended as an aid in  the diagnosis of influenza from Nasopharyngeal swab specimens and  should not be used as a sole basis for treatment. Nasal washings and  aspirates are unacceptable for Xpert Xpress SARS-CoV-2/FLU/RSV  testing. Fact Sheet for Patients: https://www.moore.com/ Fact Sheet for Healthcare Providers: https://www.young.biz/ This test is not yet approved or cleared by the [place] FDA and  has been authorized for detection and/or diagnosis of SARS-CoV-2 by  FDA under an Emergency Use Authorization (EUA). This EUA will remain  in effect (meaning this test can be used) for the duration of the  Covid-19 declaration  under Section 564(b)(1) of the Act, 21  U.S.C. section 360bbb-3(b)(1), unless the authorization is  terminated or revoked. Performed at [facility], [address] [place], [place] [address]          Radiology Studies: MR Lumbar Spine W Wo Contrast  Result Date: 08/06/2019 CLINICAL DATA:  Inability to walk EXAM: MRI LUMBAR SPINE WITHOUT AND WITH CONTRAST TECHNIQUE: Multiplanar and multiecho pulse sequences of the lumbar spine were obtained without and with intravenous contrast. CONTRAST:  57mL GADAVIST GADOBUTROL 1 MMOL/ML IV SOLN COMPARISON:  None. FINDINGS: Segmentation:  Standard Alignment:  Grade 1 anterolisthesis at L4-5 and L3-4 Vertebrae:  No fracture, evidence of discitis, or bone lesion. Conus medullaris and cauda equina: Conus extends to the L1 level. Conus and cauda equina appear normal. Paraspinal and other soft tissues: Negative Disc levels: T12-L1: Normal. L1-L2: Normal disc space and facet joints. There  is no spinal canal stenosis. No neural foraminal stenosis. L2-L3: Normal disc space and facet joints. There is no spinal canal stenosis. No neural foraminal stenosis. L3-L4: Moderate facet hypertrophy. Small disc bulge. Severe spinal canal stenosis. Moderate bilateral neural foraminal stenosis. L4-L5: Severe facet hypertrophy with mild diffuse disc bulge. Moderate spinal canal stenosis. Moderate right neural foraminal stenosis. L5-S1: Mild facet hypertrophy with minimal disc bulge. There is no spinal canal stenosis. No neural foraminal stenosis. Visualized sacrum: Normal. No abnormal contrast enhancement. IMPRESSION: 1. L3-L4 severe spinal canal stenosis with moderate bilateral neural foraminal stenosis secondary to combination of facet arthrosis and disc bulge. 2. L4-L5 severe facet arthrosis with moderate spinal canal and moderate right neural foraminal stenosis. 3. Grade 1 anterolisthesis at L3-4 and L4-5. Electronically Signed   By: Ulyses Jarred M.D.   On: 08/06/2019 02:53        Scheduled Meds: . acetaminophen  1,000 mg Oral Q8H  . enoxaparin (LOVENOX) injection  40 mg Subcutaneous Q24H  . FLUoxetine  10 mg Oral Daily  . gabapentin  300 mg Oral QHS  . levothyroxine  75 mcg Oral Q0600  . melatonin  2.5 mg Oral QHS  . multivitamin with minerals  1 tablet Oral Daily   Continuous Infusions: . sodium chloride Stopped (08/04/19 1336)  . cefTRIAXone (ROCEPHIN)  IV 1 g (08/06/19 1225)     LOS: 5 days    Time spent:  25 minutes    Tmya Wigington, MD [facility]   To contact the attending provider between 7A-7P or the covering provider during after hours 7P-7A, please log into the web site [email or web address] and access using universal [place] password for that web site. If you do not have the password, please call the hospital operator.  08/06/2019, 3:11 PM

## 2019-08-06 NOTE — Discharge Summary (Signed)
Physician Discharge Summary  Morgan Carpenter ZOX:[id number] DOB: June 19, 1950 DOA: 08/01/2019  PCP: Michail Sermon, MD  Admit date: 08/01/2019 Discharge date: 08/07/2019  Admitted From: Home Disposition: SNF  Recommendations for Outpatient Follow-up:  Patient will be discharged on oral nitrofurantoin to complete 7-day course of antibiotic until 5/6.  Follow-up with PCP in 1 week after discharge. Follow-up with orthopedics at [facility] as scheduled.    Discharge Condition: Fair CODE STATUS: Full code Diet recommendation: Regular   Discharge Diagnoses:  Principal Problem:   Complicated UTI (urinary tract infection)   Active Problems:   Osteoarthritis of knee Hypothyroidism   Sarcoidosis   OSA (obstructive sleep apnea)   Transaminitis Lower extremity weakness   Cervical myelopathy (HCC)  Brief narrative/HPI 69 year old female with neurosarcoidosis, adrenal insufficiency, chronic left MCA stroke, DJD involving lumbar and cervical spine, osteoarthritis, history of DVT and hypopituitarism along with obstructive sleep apnea presented with bilateral knee pain (more prominent on the left) with inability to walk.  Symptoms started about 2 days prior to admission with swelling and tenderness but no fever.  She was discharged from short-term rehab on 4/23.  She was seen by orthopedics 1 month back regarding her low back pain and imaging suggested cervical myelopathy and lumbar stenosis/spondylolisthesis with plan on C3-C5 PCDF at [facility]. Per husband patient has difficulty using a rolling walker for the past few days prior to admission after sustaining a fall at home.  She was seen in the ED where she was found to have UTI and discharged home on Omnicef.  However patient started developing diarrhea while on antibiotic and also had dysuria with increased urinary frequency. UA in the ED showed significant hematuria with culture growing Citrobacter freundii.  Placed on IV Rocephin and admitted to medical  floor  Hospital course   Principal problem   Complicated UTI (urinary tract infection) With associated hematuria and intolerant to p.o. antibiotic.  Culture growing Citrobacter.    Received IV Rocephin.  Dysuria and hematuria now resolved.  Based on sensitivity will discharge her on oral nitrofurantoin for 2 more days to complete 7-day course of antibiotic.  Active problems Cervical myelopathy and severe lumbar spondylosis/lumbar spines stenosis. Concern for increasing weakness of the left leg with?  Myoclonus on exam for which discharge was held yesterday.  I have obtained MRI of the lumbar spine with contrast which showed severe L3-L4 spinal canal stenosis with moderate bilateral neural foraminal stenosis.  It appears similar to MRI of the lumbar spine done and reported at [facility] 6 weeks back.  Discussed with neurology who evaluated the patient and recommended this is likely due to the knee pain and does not appear to be acute central neurological process.  No further imaging needed.  Patient is being evaluated by orthopedics with plan on posterior cervical decompression done at [facility].  Informs that she has an appointment in 4 weeks.  PT recommended SNF.  Bilateral knee pain (L >R) with lower extremity weakness. Severe pain and fall at home.  X-ray of the knee showing degenerative changes without fracture or effusion.  Questionable meniscal injury.  Pain control with scheduled Tylenol, added tramadol as needed.    Hypothyroidism Continue Synthroid  Chronic depression Continue Prozac  Transaminitis Mild.    CK mildly elevated, now resolved.  Follow-up LFTs improved.  Statin resumed.  Consult: Neurology (Dr. Loretha Brasil)  Procedure: MRI of the lumbar spine without contrast. Family communication: Husband updated on the phone.      Discharge Instructions  Allergies as of 08/06/2019      Reactions   Naprosyn [naproxen] Hives   Shellfish Allergy Hives    Sulfa Antibiotics Hives      Medication List    TAKE these medications   acetaminophen 500 MG tablet Commonly known as: TYLENOL Take 2 tablets (1,000 mg total) by mouth every 8 (eight) hours.   atorvastatin 40 MG tablet Commonly known as: LIPITOR Take 40 mg by mouth daily.   FLUoxetine 10 MG capsule Commonly known as: PROZAC Take 10 mg by mouth daily.   gabapentin 300 MG capsule Commonly known as: NEURONTIN PLEASE SEE ATTACHED FOR DETAILED DIRECTIONS   levothyroxine 75 MCG tablet Commonly known as: SYNTHROID PLEASE SEE ATTACHED FOR DETAILED DIRECTIONS   multivitamin capsule Take by mouth.   nitrofurantoin (macrocrystal-monohydrate) 100 MG capsule Commonly known as: Macrobid Take 1 capsule (100 mg total) by mouth 2 (two) times daily for 2 days.   tiZANidine 2 MG tablet Commonly known as: ZANAFLEX Take 2 mg by mouth at bedtime as needed.   traMADol 50 MG tablet Commonly known as: Ultram Take 1 tablet (50 mg total) by mouth every 8 (eight) hours as needed for up to 5 days.      Follow-up Information    MD at SNF in 1week Follow up.          Allergies  Allergen Reactions  . Naprosyn [Naproxen] Hives  . Shellfish Allergy Hives  . Sulfa Antibiotics Hives      Procedures/Studies: DG Knee 1-2 Views Left  Result Date: 08/02/2019 CLINICAL DATA:  , No known injury EXAM: LEFT KNEE - 1-2 VIEW COMPARISON:  None FINDINGS: Knee flexed on both images. Osseous mineralization normal. Joint spaces preserved. No acute fracture, dislocation, bone destruction or joint effusion seen. IMPRESSION: No acute osseous abnormalities. Electronically Signed   By: Ulyses Southward M.D.   On: 08/02/2019 13:31   DG Knee 1-2 Views Right  Result Date: 08/02/2019 CLINICAL DATA:  RIGHT knee pain today.  Initial encounter. EXAM: RIGHT KNEE - 1-2 VIEW COMPARISON:  None. FINDINGS: A small linear calcification along the MEDIAL femoral condyle appears remote and likely related to prior MCL injury.  Correlate with pain. No other fracture, subluxation or dislocation. No joint effusion noted. IMPRESSION: Small linear calcification along the MEDIAL femoral condyle appears remote and likely related to prior MCL injury. Correlate with pain. No other significant abnormalities. Electronically Signed   By: Harmon Pier M.D.   On: 08/02/2019 13:32   MR Lumbar Spine W Wo Contrast  Result Date: 08/06/2019 CLINICAL DATA:  Inability to walk EXAM: MRI LUMBAR SPINE WITHOUT AND WITH CONTRAST TECHNIQUE: Multiplanar and multiecho pulse sequences of the lumbar spine were obtained without and with intravenous contrast. CONTRAST:  70mL GADAVIST GADOBUTROL 1 MMOL/ML IV SOLN COMPARISON:  None. FINDINGS: Segmentation:  Standard Alignment:  Grade 1 anterolisthesis at L4-5 and L3-4 Vertebrae:  No fracture, evidence of discitis, or bone lesion. Conus medullaris and cauda equina: Conus extends to the L1 level. Conus and cauda equina appear normal. Paraspinal and other soft tissues: Negative Disc levels: T12-L1: Normal. L1-L2: Normal disc space and facet joints. There is no spinal canal stenosis. No neural foraminal stenosis. L2-L3: Normal disc space and facet joints. There is no spinal canal stenosis. No neural foraminal stenosis. L3-L4: Moderate facet hypertrophy. Small disc bulge. Severe spinal canal stenosis. Moderate bilateral neural foraminal stenosis. L4-L5: Severe facet hypertrophy with mild diffuse disc bulge. Moderate spinal canal stenosis. Moderate right neural foraminal stenosis. L5-S1: Mild  facet hypertrophy with minimal disc bulge. There is no spinal canal stenosis. No neural foraminal stenosis. Visualized sacrum: Normal. No abnormal contrast enhancement. IMPRESSION: 1. L3-L4 severe spinal canal stenosis with moderate bilateral neural foraminal stenosis secondary to combination of facet arthrosis and disc bulge. 2. L4-L5 severe facet arthrosis with moderate spinal canal and moderate right neural foraminal stenosis. 3. Grade  1 anterolisthesis at L3-4 and L4-5. Electronically Signed   By: Deatra RobinsonKevin  Herman M.D.   On: 08/06/2019 02:53   US Venous Img Lower Bilateral  Result Date: 08/01/2019 CLINICAL DATA:  Cramping behind the knees bilaterally for 2 days, decreased mobility. Also reportedly with prior DVT on anticoagulation. EXAM: Bilateral LOWER EXTREMITY VENOUS DOPPLER ULTRASOUND TECHNIQUE: Gray-scale sonography with compression, as well as color and duplex ultrasound, were performed to evaluate the deep venous system(s) from the level of the common femoral vein through the popliteal and proximal calf veins. COMPARISON:  None FINDINGS: VENOUS Normal compressibility of the common femoral, superficial femoral, and popliteal veins, as well as the visualized calf veins. Visualized portions of profunda femoral vein and great saphenous vein unremarkable. No filling defects to suggest DVT on grayscale or color Doppler imaging. Doppler waveforms show normal direction of venous flow, normal respiratory plasticity and response to augmentation. OTHER None. Limitations: Limited visualization of the bilateral calf pain, specifically peroneal vein IMPRESSION: Negative for DVT in the bilateral lower extremities. Mildly limited assessment of the calf as described. Electronically Signed   By: Donzetta KohutGeoffrey  Wile M.D.   On: 08/01/2019 09:45   DG Chest Port 1 View  Result Date: 08/01/2019 CLINICAL DATA:  Weakness. EXAM: PORTABLE CHEST 1 VIEW COMPARISON:  None. FINDINGS: 0833 hours. The lungs are clear without focal pneumonia, edema, pneumothorax or pleural effusion. The cardiopericardial silhouette is within normal limits for size. The visualized bony structures of the thorax are intact. IMPRESSION: No active disease. Electronically Signed   By: Kennith CenterEric  Mansell M.D.   On: 08/01/2019 08:59   US Abdomen Limited RUQ  Result Date: 08/02/2019 CLINICAL DATA:  Transaminitis EXAM: ULTRASOUND ABDOMEN LIMITED RIGHT UPPER QUADRANT COMPARISON:  None. FINDINGS:  Gallbladder: Prior cholecystectomy Common bile duct: Diameter: Normal caliber, 4 mm. Liver: Increased echotexture compatible with fatty infiltration. No focal abnormality or biliary ductal dilatation. Portal vein is patent on color Doppler imaging with normal direction of blood flow towards the liver. Other: None. IMPRESSION: Fatty infiltration of the liver. Prior cholecystectomy. Electronically Signed   By: Charlett NoseKevin  Dover M.D.   On: 08/02/2019 09:11     Subjective: Seen and examined.  Pain in the knee is better  Discharge Exam: Vitals:   08/06/19 0000 08/06/19 0729  BP: (!) 157/81 (!) 141/87  Pulse: 83 85  Resp:  16  Temp:  98.8 F (37.1 C)  SpO2:  97%   Vitals:   08/05/19 0842 08/05/19 2342 08/06/19 0000 08/06/19 0729  BP: 132/74 (!) 171/91 (!) 157/81 (!) 141/87  Pulse: 78 89 83 85  Resp: 18 16  16   Temp: 98.3 F (36.8 C) 98.7 F (37.1 C)  98.8 F (37.1 C)  TempSrc: Oral Oral  Oral  SpO2: 95% 98%  97%  Weight:      Height:        General: Elderly female not in distress HEENT: Moist with a, supple neck Chest: Clear bilaterally CVS: Normal S1-S2 GI: Soft, nondistended, nontender Musculoskeletal: Warm, no edema, minimal tenderness over left knee CNS: Alert and oriented, 4/5 power over left knee, limited with pain, plantars equivocal bilaterally, normal  reflexes, normal sensation    The results of significant diagnostics from this hospitalization (including imaging, microbiology, ancillary and laboratory) are listed below for reference.     Microbiology: Recent Results (from the past 240 hour(s))  Urine culture     Status: Abnormal   Collection Time: 07/28/19 11:39 AM   Specimen: Urine, Random  Result Value Ref Range Status   Specimen Description   Final    URINE, RANDOM Performed at [facility], [address] [place], [place] [address]    Special Requests   Final    NONE Performed at [facility], [address] [place], [place]  [address]    Culture >=100,000 COLONIES/mL CITROBACTER FREUNDII (A)  Final   Report Status 07/30/2019 FINAL  Final   Organism ID, Bacteria CITROBACTER FREUNDII (A)  Final      Susceptibility   Citrobacter freundii - MIC*    CEFAZOLIN >=64 RESISTANT Resistant     CEFTRIAXONE <=1 SENSITIVE Sensitive     CIPROFLOXACIN <=0.25 SENSITIVE Sensitive     GENTAMICIN <=1 SENSITIVE Sensitive     IMIPENEM 1 SENSITIVE Sensitive     NITROFURANTOIN <=16 SENSITIVE Sensitive     TRIMETH/SULFA <=20 SENSITIVE Sensitive     PIP/TAZO <=4 SENSITIVE Sensitive     * >=100,000 COLONIES/mL CITROBACTER FREUNDII  Respiratory Panel by RT PCR (Flu A&B, Covid) - Nasopharyngeal Swab     Status: None   Collection Time: 08/01/19 11:21 AM   Specimen: Nasopharyngeal Swab  Result Value Ref Range Status   SARS Coronavirus 2 by RT PCR NEGATIVE NEGATIVE Final    Comment: (NOTE) SARS-CoV-2 target nucleic acids are NOT DETECTED. The SARS-CoV-2 RNA is generally detectable in upper respiratoy specimens during the acute phase of infection. The lowest concentration of SARS-CoV-2 viral copies this assay can detect is 131 copies/mL. A negative result does not preclude SARS-Cov-2 infection and should not be used as the sole basis for treatment or other patient management decisions. A negative result may occur with  improper specimen collection/handling, submission of specimen other than nasopharyngeal swab, presence of viral mutation(s) within the areas targeted by this assay, and inadequate number of viral copies (<131 copies/mL). A negative result must be combined with clinical observations, patient history, and epidemiological information. The expected result is Negative. Fact Sheet for Patients:  https://www.moore.com/ Fact Sheet for Healthcare Providers:  https://www.young.biz/ This test is not yet ap proved or cleared by the [place] FDA and  has been authorized for detection  and/or diagnosis of SARS-CoV-2 by FDA under an Emergency Use Authorization (EUA). This EUA will remain  in effect (meaning this test can be used) for the duration of the COVID-19 declaration under Section 564(b)(1) of the Act, 21 U.S.C. section 360bbb-3(b)(1), unless the authorization is terminated or revoked sooner.    Influenza A by PCR NEGATIVE NEGATIVE Final   Influenza B by PCR NEGATIVE NEGATIVE Final    Comment: (NOTE) The Xpert Xpress SARS-CoV-2/FLU/RSV assay is intended as an aid in  the diagnosis of influenza from Nasopharyngeal swab specimens and  should not be used as a sole basis for treatment. Nasal washings and  aspirates are unacceptable for Xpert Xpress SARS-CoV-2/FLU/RSV  testing. Fact Sheet for Patients: https://www.moore.com/ Fact Sheet for Healthcare Providers: https://www.young.biz/ This test is not yet approved or cleared by the [place] FDA and  has been authorized for detection and/or diagnosis of SARS-CoV-2 by  FDA under an Emergency Use Authorization (EUA). This EUA will remain  in effect (meaning  this test can be used) for the duration of the  Covid-19 declaration under Section 564(b)(1) of the Act, 21  U.S.C. section 360bbb-3(b)(1), unless the authorization is  terminated or revoked. Performed at [facility], [address], [place], [place] [address]   Respiratory Panel by RT PCR (Flu A&B, Covid) - Nasopharyngeal Swab     Status: None   Collection Time: 08/05/19  8:25 AM   Specimen: Nasopharyngeal Swab  Result Value Ref Range Status   SARS Coronavirus 2 by RT PCR NEGATIVE NEGATIVE Final    Comment: (NOTE) SARS-CoV-2 target nucleic acids are NOT DETECTED. The SARS-CoV-2 RNA is generally detectable in upper respiratoy specimens during the acute phase of infection. The lowest concentration of SARS-CoV-2 viral copies this assay can detect is 131 copies/mL. A negative result does not preclude  SARS-Cov-2 infection and should not be used as the sole basis for treatment or other patient management decisions. A negative result may occur with  improper specimen collection/handling, submission of specimen other than nasopharyngeal swab, presence of viral mutation(s) within the areas targeted by this assay, and inadequate number of viral copies (<131 copies/mL). A negative result must be combined with clinical observations, patient history, and epidemiological information. The expected result is Negative. Fact Sheet for Patients:  https://www.moore.com/ Fact Sheet for Healthcare Providers:  https://www.young.biz/ This test is not yet ap proved or cleared by the [place] FDA and  has been authorized for detection and/or diagnosis of SARS-CoV-2 by FDA under an Emergency Use Authorization (EUA). This EUA will remain  in effect (meaning this test can be used) for the duration of the COVID-19 declaration under Section 564(b)(1) of the Act, 21 U.S.C. section 360bbb-3(b)(1), unless the authorization is terminated or revoked sooner.    Influenza A by PCR NEGATIVE NEGATIVE Final   Influenza B by PCR NEGATIVE NEGATIVE Final    Comment: (NOTE) The Xpert Xpress SARS-CoV-2/FLU/RSV assay is intended as an aid in  the diagnosis of influenza from Nasopharyngeal swab specimens and  should not be used as a sole basis for treatment. Nasal washings and  aspirates are unacceptable for Xpert Xpress SARS-CoV-2/FLU/RSV  testing. Fact Sheet for Patients: https://www.moore.com/ Fact Sheet for Healthcare Providers: https://www.young.biz/ This test is not yet approved or cleared by the [place] FDA and  has been authorized for detection and/or diagnosis of SARS-CoV-2 by  FDA under an Emergency Use Authorization (EUA). This EUA will remain  in effect (meaning this test can be used) for the duration of the  Covid-19  declaration under Section 564(b)(1) of the Act, 21  U.S.C. section 360bbb-3(b)(1), unless the authorization is  terminated or revoked. Performed at [facility], [address] [place], [place] [address]      Labs: BNP (last 3 results) No results for input(s): BNP in the last 8760 hours. Basic Metabolic Panel: Recent Labs  Lab 08/01/19 0759 08/02/19 0632 08/03/19 0415  NA 137 140 137  K 3.6 4.3 3.6  CL 106 108 110  CO2 22 25 21*  GLUCOSE 101* 97 92  BUN 11 10 8   CREATININE 0.99 0.93 0.92  CALCIUM 8.8* 8.1* 7.9*   Liver Function Tests: Recent Labs  Lab 08/01/19 0759 08/03/19 0415  AST 76* 62*  ALT 77* 64*  ALKPHOS 75 60  BILITOT 0.6 0.6  PROT 6.7 5.4*  ALBUMIN 3.7 3.0*   No results for input(s): LIPASE, AMYLASE in the last 168 hours. No results for input(s): AMMONIA in the last 168 hours. CBC: Recent  Labs  Lab 08/01/19 0759 08/02/19 0632  WBC 4.6 4.1  HGB 12.5 11.2*  HCT 39.7 35.9*  MCV 86.9 88.6  PLT 287 245   Cardiac Enzymes: Recent Labs  Lab 08/01/19 0759 08/03/19 0415  CKTOTAL 238* 213   BNP: Invalid input(s): POCBNP CBG: No results for input(s): GLUCAP in the last 168 hours. D-Dimer No results for input(s): DDIMER in the last 72 hours. Hgb A1c No results for input(s): HGBA1C in the last 72 hours. Lipid Profile No results for input(s): CHOL, HDL, LDLCALC, TRIG, CHOLHDL, LDLDIRECT in the last 72 hours. Thyroid function studies No results for input(s): TSH, T4TOTAL, T3FREE, THYROIDAB in the last 72 hours.  Invalid input(s): FREET3 Anemia work up No results for input(s): VITAMINB12, FOLATE, FERRITIN, TIBC, IRON, RETICCTPCT in the last 72 hours. Urinalysis    Component Value Date/Time   COLORURINE RED (A) 08/01/2019 0829   APPEARANCEUR TURBID (A) 08/01/2019 0829   LABSPEC 1.030 08/01/2019 0829   PHURINE  08/01/2019 0829    TEST NOT REPORTED DUE TO COLOR INTERFERENCE OF URINE PIGMENT   GLUCOSEU (A) 08/01/2019 0829    TEST NOT  REPORTED DUE TO COLOR INTERFERENCE OF URINE PIGMENT   HGBUR (A) 08/01/2019 0829    TEST NOT REPORTED DUE TO COLOR INTERFERENCE OF URINE PIGMENT   BILIRUBINUR (A) 08/01/2019 0829    TEST NOT REPORTED DUE TO COLOR INTERFERENCE OF URINE PIGMENT   KETONESUR (A) 08/01/2019 0829    TEST NOT REPORTED DUE TO COLOR INTERFERENCE OF URINE PIGMENT   PROTEINUR (A) 08/01/2019 0829    TEST NOT REPORTED DUE TO COLOR INTERFERENCE OF URINE PIGMENT   NITRITE (A) 08/01/2019 0829    TEST NOT REPORTED DUE TO COLOR INTERFERENCE OF URINE PIGMENT   LEUKOCYTESUR (A) 08/01/2019 0829    TEST NOT REPORTED DUE TO COLOR INTERFERENCE OF URINE PIGMENT   Sepsis Labs Invalid input(s): PROCALCITONIN,  WBC,  LACTICIDVEN Microbiology Recent Results (from the past 240 hour(s))  Urine culture     Status: Abnormal   Collection Time: 07/28/19 11:39 AM   Specimen: Urine, Random  Result Value Ref Range Status   Specimen Description   Final    URINE, RANDOM Performed at [facility], [address] [place], [place] [address]    Special Requests   Final    NONE Performed at [facility], [address] [place], [place] [address]    Culture >=100,000 COLONIES/mL CITROBACTER FREUNDII (A)  Final   Report Status 07/30/2019 FINAL  Final   Organism ID, Bacteria CITROBACTER FREUNDII (A)  Final      Susceptibility   Citrobacter freundii - MIC*    CEFAZOLIN >=64 RESISTANT Resistant     CEFTRIAXONE <=1 SENSITIVE Sensitive     CIPROFLOXACIN <=0.25 SENSITIVE Sensitive     GENTAMICIN <=1 SENSITIVE Sensitive     IMIPENEM 1 SENSITIVE Sensitive     NITROFURANTOIN <=16 SENSITIVE Sensitive     TRIMETH/SULFA <=20 SENSITIVE Sensitive     PIP/TAZO <=4 SENSITIVE Sensitive     * >=100,000 COLONIES/mL CITROBACTER FREUNDII  Respiratory Panel by RT PCR (Flu A&B, Covid) - Nasopharyngeal Swab     Status: None   Collection Time: 08/01/19 11:21 AM   Specimen: Nasopharyngeal Swab  Result Value Ref Range Status   SARS  Coronavirus 2 by RT PCR NEGATIVE NEGATIVE Final    Comment: (NOTE) SARS-CoV-2 target nucleic acids are NOT DETECTED. The SARS-CoV-2 RNA is generally detectable in upper respiratoy specimens during the acute phase of  infection. The lowest concentration of SARS-CoV-2 viral copies this assay can detect is 131 copies/mL. A negative result does not preclude SARS-Cov-2 infection and should not be used as the sole basis for treatment or other patient management decisions. A negative result may occur with  improper specimen collection/handling, submission of specimen other than nasopharyngeal swab, presence of viral mutation(s) within the areas targeted by this assay, and inadequate number of viral copies (<131 copies/mL). A negative result must be combined with clinical observations, patient history, and epidemiological information. The expected result is Negative. Fact Sheet for Patients:  PinkCheek.be Fact Sheet for Healthcare Providers:  GravelBags.it This test is not yet ap proved or cleared by the [place] FDA and  has been authorized for detection and/or diagnosis of SARS-CoV-2 by FDA under an Emergency Use Authorization (EUA). This EUA will remain  in effect (meaning this test can be used) for the duration of the COVID-19 declaration under Section 564(b)(1) of the Act, 21 U.S.C. section 360bbb-3(b)(1), unless the authorization is terminated or revoked sooner.    Influenza A by PCR NEGATIVE NEGATIVE Final   Influenza B by PCR NEGATIVE NEGATIVE Final    Comment: (NOTE) The Xpert Xpress SARS-CoV-2/FLU/RSV assay is intended as an aid in  the diagnosis of influenza from Nasopharyngeal swab specimens and  should not be used as a sole basis for treatment. Nasal washings and  aspirates are unacceptable for Xpert Xpress SARS-CoV-2/FLU/RSV  testing. Fact Sheet for Patients: PinkCheek.be Fact Sheet  for Healthcare Providers: GravelBags.it This test is not yet approved or cleared by the [place] FDA and  has been authorized for detection and/or diagnosis of SARS-CoV-2 by  FDA under an Emergency Use Authorization (EUA). This EUA will remain  in effect (meaning this test can be used) for the duration of the  Covid-19 declaration under Section 564(b)(1) of the Act, 21  U.S.C. section 360bbb-3(b)(1), unless the authorization is  terminated or revoked. Performed at [facility], [address] [place], [place] [address]   Respiratory Panel by RT PCR (Flu A&B, Covid) - Nasopharyngeal Swab     Status: None   Collection Time: 08/05/19  8:25 AM   Specimen: Nasopharyngeal Swab  Result Value Ref Range Status   SARS Coronavirus 2 by RT PCR NEGATIVE NEGATIVE Final    Comment: (NOTE) SARS-CoV-2 target nucleic acids are NOT DETECTED. The SARS-CoV-2 RNA is generally detectable in upper respiratoy specimens during the acute phase of infection. The lowest concentration of SARS-CoV-2 viral copies this assay can detect is 131 copies/mL. A negative result does not preclude SARS-Cov-2 infection and should not be used as the sole basis for treatment or other patient management decisions. A negative result may occur with  improper specimen collection/handling, submission of specimen other than nasopharyngeal swab, presence of viral mutation(s) within the areas targeted by this assay, and inadequate number of viral copies (<131 copies/mL). A negative result must be combined with clinical observations, patient history, and epidemiological information. The expected result is Negative. Fact Sheet for Patients:  PinkCheek.be Fact Sheet for Healthcare Providers:  GravelBags.it This test is not yet ap proved or cleared by the [place] FDA and  has been authorized for detection and/or diagnosis of  SARS-CoV-2 by FDA under an Emergency Use Authorization (EUA). This EUA will remain  in effect (meaning this test can be used) for the duration of the COVID-19 declaration under Section 564(b)(1) of the Act, 21 U.S.C. section 360bbb-3(b)(1), unless the authorization is terminated or revoked sooner.  Influenza A by PCR NEGATIVE NEGATIVE Final   Influenza B by PCR NEGATIVE NEGATIVE Final    Comment: (NOTE) The Xpert Xpress SARS-CoV-2/FLU/RSV assay is intended as an aid in  the diagnosis of influenza from Nasopharyngeal swab specimens and  should not be used as a sole basis for treatment. Nasal washings and  aspirates are unacceptable for Xpert Xpress SARS-CoV-2/FLU/RSV  testing. Fact Sheet for Patients: https://www.moore.com/ Fact Sheet for Healthcare Providers: https://www.young.biz/ This test is not yet approved or cleared by the [place] FDA and  has been authorized for detection and/or diagnosis of SARS-CoV-2 by  FDA under an Emergency Use Authorization (EUA). This EUA will remain  in effect (meaning this test can be used) for the duration of the  Covid-19 declaration under Section 564(b)(1) of the Act, 21  U.S.C. section 360bbb-3(b)(1), unless the authorization is  terminated or revoked. Performed at [facility], [address], [place], [place] [address]      Time coordinating discharge: 35 minutes  SIGNED:   Eddie North, MD  [facility] 08/06/2019, 3:22 PM Pager   If 7PM-7AM, please contact night-coverage [email or web address] Password [email or web address]

## 2019-08-07 MED ORDER — TRAMADOL HCL 50 MG PO TABS
50.0000 mg | ORAL_TABLET | Freq: Four times a day (QID) | ORAL | Status: DC | PRN
  Administered 2019-08-07 (×2): 50 mg via ORAL
  Filled 2019-08-07 (×2): qty 1

## 2019-08-07 MED ORDER — ACETAMINOPHEN 500 MG PO TABS: 500.0000 mg | ORAL_TABLET | Freq: Four times a day (QID) | ORAL | Status: DC | PRN

## 2019-08-07 MED ORDER — BISACODYL 5 MG PO TBEC
10.0000 mg | DELAYED_RELEASE_TABLET | Freq: Once | ORAL | Status: AC
  Administered 2019-08-07: 10 mg via ORAL
  Filled 2019-08-07: qty 2

## 2019-08-07 MED ORDER — PANTOPRAZOLE SODIUM 40 MG PO TBEC
40.0000 mg | DELAYED_RELEASE_TABLET | Freq: Every day | ORAL | Status: DC
  Administered 2019-08-07 – 2019-08-09 (×3): 40 mg via ORAL
  Filled 2019-08-07 (×4): qty 1

## 2019-08-07 MED ORDER — BISACODYL 10 MG RE SUPP: 10.0000 mg | Freq: Every day | RECTAL | Status: DC

## 2019-08-07 MED ORDER — NITROFURANTOIN MONOHYD MACRO 100 MG PO CAPS
100.0000 mg | ORAL_CAPSULE | Freq: Two times a day (BID) | ORAL | Status: AC
  Administered 2019-08-07 – 2019-08-08 (×4): 100 mg via ORAL
  Filled 2019-08-07 (×4): qty 1

## 2019-08-07 MED ORDER — ENSURE ENLIVE PO LIQD
237.0000 mL | Freq: Two times a day (BID) | ORAL | Status: DC
  Administered 2019-08-07 – 2019-08-08 (×3): 237 mL via ORAL

## 2019-08-07 MED ORDER — SENNOSIDES-DOCUSATE SODIUM 8.6-50 MG PO TABS
2.0000 | ORAL_TABLET | Freq: Every day | ORAL | Status: DC
  Administered 2019-08-09: 09:00:00 2 via ORAL
  Filled 2019-08-07 (×2): qty 2

## 2019-08-07 NOTE — Progress Notes (Signed)
Physical Therapy Treatment Patient Details Name: Morgan Carpenter MRN: [id number] DOB: June 17, 1950 Today's Date: 08/07/2019    History of Present Illness Morgan Carpenter is a 69 y.o. female with medical history significant for neurosarcoidosis, adrenal insufficiency, degenerative disc disease involving the lumbar and cervical spine, osteoarthritis, history of DVT, hypopituitarism and obstructive sleep apnea who presents to the emergency room for evaluation of bilateral knee pain and inability to walk.  Pt recently dicharged from [facility] where she spent 3 weeks in rehab.  MD assessment includes: Complicated UTI with hematuria, hypothyroidism, sarcoidosis, and transaminitis.    PT Comments    Pt pleasant and motivated to participate during the session.  Pt's B knee ext ROM measurements as follows: baseline R lacking 30 deg, L lacking 41 deg.  After extensive A/AAROM: R lacking 8 deg, L lacking 18 deg.  Pt required mod A with bed mobility tasks and a heavy +2 Mod A to stand from an elevated EOB.  Once in standing pt limited by inability to extend B knees and once assistance was reduced pt was unable to maintain static standing position and returned to sitting at the EOB.  Pt will benefit from PT services in a SNF setting upon discharge to safely address deficits listed in patient problem list for decreased caregiver assistance and eventual return to PLOF.       Follow Up Recommendations  SNF     Equipment Recommendations  None recommended by PT    Recommendations for Other Services       Precautions / Restrictions Precautions Precautions: Fall Restrictions Weight Bearing Restrictions: No    Mobility  Bed Mobility Overal bed mobility: Needs Assistance Bed Mobility: Supine to Sit;Sit to Supine     Supine to sit: Mod assist Sit to supine: Mod assist   General bed mobility comments: Mod A for BLE and trunk control  Transfers Overall transfer level: Needs assistance Equipment  used: Rolling walker (2 wheeled) Transfers: Sit to/from Stand Sit to Stand: +2 physical assistance;Mod assist;From elevated surface            Ambulation/Gait             General Gait Details: Unable/unsafe to attempt   Stairs             Wheelchair Mobility    Modified Rankin (Stroke Patients Only)       Balance Overall balance assessment: Needs assistance Sitting-balance support: Bilateral upper extremity supported Sitting balance-Leahy Scale: Good     Standing balance support: Bilateral upper extremity supported Standing balance-Leahy Scale: Zero Standing balance comment: +2 Mod assist to keep patient from falling in standing with a RW                            Cognition Arousal/Alertness: Awake/alert Behavior During Therapy: WFL for tasks assessed/performed Overall Cognitive Status: Within Functional Limits for tasks assessed                                        Exercises Total Joint Exercises Ankle Circles/Pumps: AROM;Both;10 reps Quad Sets: AROM;Strengthening;Both;10 reps;15 reps;20 reps;AAROM Heel Slides: AAROM;Both;10 reps Hip ABduction/ADduction: AAROM;Both;10 reps Straight Leg Raises: AAROM;Both;10 reps Long Arc Quad: AROM;Both;10 reps;Seated Knee Flexion: AROM;Both;10 reps;Seated Other Exercises Other Exercises: Gentle, low amplitude B knee ext PROM with 3 sec holds at end range to patient's tolerance  General Comments        Pertinent Vitals/Pain Pain Assessment: 0-10 Pain Score: 8  Pain Location: 8/10 L knee, 6/10 R knee Pain Descriptors / Indicators: Aching;Sore Pain Intervention(s): Premedicated before session;Monitored during session    Home Living                      Prior Function            PT Goals (current goals can now be found in the care plan section) Progress towards PT goals: Progressing toward goals    Frequency    Min 2X/week      PT Plan Current plan  remains appropriate    Co-evaluation              AM-PAC PT "6 Clicks" Mobility   Outcome Measure  Help needed turning from your back to your side while in a flat bed without using bedrails?: A Little Help needed moving from lying on your back to sitting on the side of a flat bed without using bedrails?: A Lot Help needed moving to and from a bed to a chair (including a wheelchair)?: Total Help needed standing up from a chair using your arms (e.g., wheelchair or bedside chair)?: A Lot Help needed to walk in hospital room?: Total Help needed climbing 3-5 steps with a railing? : Total 6 Click Score: 10    End of Session Equipment Utilized During Treatment: Gait belt Activity Tolerance: Patient tolerated treatment well Patient left: in bed;with call bell/phone within reach;with bed alarm set Nurse Communication: Mobility status PT Visit Diagnosis: Muscle weakness (generalized) (M62.81);Pain Pain - Right/Left: Left(bilateral) Pain - part of body: Knee     Time: 5277-8242 PT Time Calculation (min) (ACUTE ONLY): 25 min  Charges:  $Therapeutic Exercise: 23-37 mins                     D. Scott Nyshawn Gowdy PT, DPT 08/07/19, 3:53 PM

## 2019-08-07 NOTE — TOC Progression Note (Signed)
Transition of Care [facility]) - Progression Note    Patient Details  Name: IDY RAWLING MRN: [id number] Date of Birth: [birth year]  Transition of Care [facility]) CM/SW Contact  Trenton Founds, RN Phone Number: 08/07/2019, 9:29 AM  Clinical Narrative:   RNCM completed regulatory documentation and contacted husband along with department leadership Kathi Der.     Expected Discharge Plan: Home/Self Care Barriers to Discharge: Continued Medical Work up  Expected Discharge Plan and Services Expected Discharge Plan: Home/Self Care In-house Referral: Clinical Social Work     Living arrangements for the past 2 months: Single Family Home Expected Discharge Date: 08/06/19                                     Social Determinants of Health (SDOH) Interventions    Readmission Risk Interventions No flowsheet data found.

## 2019-08-07 NOTE — TOC Progression Note (Signed)
Transition of Care [facility]) - Progression Note    Patient Details  Name: KESTREL MIS MRN: [id number] Date of Birth: [birth year]  Transition of Care [facility]) CM/SW Contact  Trenton Founds, RN Phone Number: 08/07/2019, 7:33 AM  Clinical Narrative:   Late entry for 5/4  [facility] informed by attending MD that patient is now cleared by neurology and ready for d/c, however when he called to discuss with husband he was having serious concerns about his ability to care for her at home.  RNCM placed call to facilities that had offered bed. Both [facility] and [facility] report that since patient is in her copay days they would require and 7-10 upfront payment of those copays. [facility] reports that they could work out a Insurance claims handler.  RNCM placed call to husband to discuss, informed him of information learned from facilities and he immediately reported that he couldn't bring her home because it was an unsafe situation and she couldn't go to the facilities because they couldn't afford it. Husband informed this RNCM that he was currently on hold with [facility] and they were going to help him appeal the discharge.  RNCM notified department leadership that this was husbands plan.     Expected Discharge Plan: Home/Self Care Barriers to Discharge: Continued Medical Work up  Expected Discharge Plan and Services Expected Discharge Plan: Home/Self Care In-house Referral: Clinical Social Work     Living arrangements for the past 2 months: Single Family Home Expected Discharge Date: 08/06/19                                     Social Determinants of Health (SDOH) Interventions    Readmission Risk Interventions No flowsheet data found.

## 2019-08-07 NOTE — Progress Notes (Signed)
[facility]  -  at [facility]   PATIENT NAME: Morgan Carpenter    MR#:  [id number]  DATE OF BIRTH:  04-10-1950  SUBJECTIVE:  patient had about hundred cc of emesis this morning. She had some nausea. No bowel movement in three days. Complains of generalized weakness. No family in the room. tolerated PO diet yesterday.  REVIEW OF SYSTEMS:   Review of Systems  Constitutional: Negative for chills, fever and weight loss.  HENT: Negative for ear discharge, ear pain and nosebleeds.   Eyes: Negative for blurred vision, pain and discharge.  Respiratory: Negative for sputum production, shortness of breath, wheezing and stridor.   Cardiovascular: Negative for chest pain, palpitations, orthopnea and PND.  Gastrointestinal: Positive for nausea and vomiting. Negative for abdominal pain and diarrhea.  Genitourinary: Negative for frequency and urgency.  Musculoskeletal: Positive for falls and joint pain. Negative for back pain.  Neurological: Positive for weakness. Negative for sensory change, speech change and focal weakness.  Psychiatric/Behavioral: Negative for depression and hallucinations. The patient is not nervous/anxious.    Tolerating Diet:yes Tolerating PT: rec rehab  DRUG ALLERGIES:   Allergies  Allergen Reactions  . Naprosyn [Naproxen] Hives  . Shellfish Allergy Hives  . Sulfa Antibiotics Hives    VITALS:  Blood pressure (!) 150/85, pulse 85, temperature 98.1 F (36.7 C), temperature source Oral, resp. rate 18, height 5\' 4"  (1.626 m), weight 83.5 kg, SpO2 96 %.  PHYSICAL EXAMINATION:   Physical Exam  GENERAL:  69 y.o.-year-old patient lying in the bed with no acute distress.obese Deconditioned, chronically ill  EYES: Pupils equal, round, reactive to light and accommodation. No scleral icterus.   HEENT: Head atraumatic, normocephalic. Oropharynx and nasopharynx clear.  NECK:  Supple, no jugular venous distention. No thyroid enlargement, no tenderness.   LUNGS: Normal breath sounds bilaterally, no wheezing, rales, rhonchi. No use of accessory muscles of respiration.  CARDIOVASCULAR: S1, S2 normal. No murmurs, rubs, or gallops.  ABDOMEN: Soft, nontender, nondistended. Bowel sounds present. No organomegaly or mass.  EXTREMITIES: No cyanosis, clubbing or edema b/l.   DJD changes + NEUROLOGIC: Cranial nerves II through XII are intact. No focal Motor or sensory deficits b/l.  Weakness generalized PSYCHIATRIC:  patient is alert and oriented x 3.  SKIN: No obvious rash, lesion, or ulcer.   LABORATORY PANEL:  CBC Recent Labs  Lab 08/02/19 0632  WBC 4.1  HGB 11.2*  HCT 35.9*  PLT 245    Chemistries  Recent Labs  Lab 08/03/19 0415  NA 137  K 3.6  CL 110  CO2 21*  GLUCOSE 92  BUN 8  CREATININE 0.92  CALCIUM 7.9*  AST 62*  ALT 64*  ALKPHOS 60  BILITOT 0.6   Cardiac Enzymes No results for input(s): TROPONINI in the last 168 hours. RADIOLOGY:  MR Lumbar Spine W Wo Contrast  Result Date: 08/06/2019 CLINICAL DATA:  Inability to walk EXAM: MRI LUMBAR SPINE WITHOUT AND WITH CONTRAST TECHNIQUE: Multiplanar and multiecho pulse sequences of the lumbar spine were obtained without and with intravenous contrast. CONTRAST:  70mL GADAVIST GADOBUTROL 1 MMOL/ML IV SOLN COMPARISON:  None. FINDINGS: Segmentation:  Standard Alignment:  Grade 1 anterolisthesis at L4-5 and L3-4 Vertebrae:  No fracture, evidence of discitis, or bone lesion. Conus medullaris and cauda equina: Conus extends to the L1 level. Conus and cauda equina appear normal. Paraspinal and other soft tissues: Negative Disc levels: T12-L1: Normal. L1-L2: Normal disc space and facet joints. There is no spinal canal stenosis. No neural  foraminal stenosis. L2-L3: Normal disc space and facet joints. There is no spinal canal stenosis. No neural foraminal stenosis. L3-L4: Moderate facet hypertrophy. Small disc bulge. Severe spinal canal stenosis. Moderate bilateral neural foraminal stenosis. L4-L5:  Severe facet hypertrophy with mild diffuse disc bulge. Moderate spinal canal stenosis. Moderate right neural foraminal stenosis. L5-S1: Mild facet hypertrophy with minimal disc bulge. There is no spinal canal stenosis. No neural foraminal stenosis. Visualized sacrum: Normal. No abnormal contrast enhancement. IMPRESSION: 1. L3-L4 severe spinal canal stenosis with moderate bilateral neural foraminal stenosis secondary to combination of facet arthrosis and disc bulge. 2. L4-L5 severe facet arthrosis with moderate spinal canal and moderate right neural foraminal stenosis. 3. Grade 1 anterolisthesis at L3-4 and L4-5. Electronically Signed   By: Ulyses Jarred M.D.   On: 08/06/2019 02:53   ASSESSMENT AND PLAN:  69 year old female with neurosarcoidosis, adrenal insufficiency, chronic left MCA stroke, DJD involving lumbar and cervical spine, osteoarthritis, history of DVT and hypopituitarism along with obstructive sleep apnea presented with bilateral knee pain (more prominent on the left) with inability to walk. Symptoms started about 2 days prior to admission with swelling and tenderness but no fever. She was discharged from short-term rehab on 4/23. She was seen by orthopedics 1 month back regarding her low back pain and imaging suggested cervical myelopathy and lumbar stenosis/spondylolisthesis with plan on C3-C5 PCDF at [facility].  Complicated UTI (urinary tract infection)- -With associated hematuria and intolerant to p.o. antibiotic. - Culture growing Citrobacter.   Received IV Rocephin.  Dysuria and hematuria now resolved.  Based on sensitivity will change to  oral nitrofurantoin for 2 more days to complete 7-day course of antibiotic. -afebrile -wbc stable  Chronic known h/o Cervical myelopathy and severe lumbar spondylosis/lumbar spinal stenosis. -Concern for increasing weakness of the left leg  - MRI of the lumbar spine with contrast which showed severe L3-L4 spinal canal stenosis with moderate bilateral  neural foraminal stenosis. It appears similar to MRI of the lumbar spine done and reported at [facility] 6 weeks back. -Consulted Neurology (Dr. Irish Elders) who evaluated the patient and recommended this is likely due to the knee pain and does not appear to be acute central neurological process. No further imaging needed.--pt to f/u with [facility]  -Patient is being evaluated by [facility]opedics ([facility]) with plan on posterior cervical decompression done at [facility]. Informs that she has an appointment June 9th  -PT recommended SNF.  Bilateral knee pain (L >R)with lower extremity weakness. -Severe pain and fall at home. - X-ray of the knee showing degenerative changes without fracture or effusion.  -Pain control with scheduled Tylenol, added tramadol as needed.  Hypothyroidism Continue Synthroid  Chronic depression Continue Prozac  Transaminitis Mild.  CK mildly elevated, now resolved.  Follow-up LFTs improved.  Statin resumed.  Constipation with nausea and vomiting -Dulcolax suppository today -No BM in 3 days -avoid narcotics  Procedures:none Family communication :Husband Merilyn Baba on the phone  Consults :Neurology CODE STATUS: Full DVT Prophylaxis :lovenox   Status is: Inpatient  Dispo: The patient is from: Home              Anticipated d/c is to: TBD              Anticipated d/c date: TBD              Patient currently stable best at baseline but awaiting Husbands insurance appeal that was made today.   TOTAL TIME TAKING CARE OF THIS PATIENT: 25 minutes.  >  50% time spent on counselling and coordination of care  Note: This dictation was prepared with Dragon dictation along with smaller phrase technology. Any transcriptional errors that result from this process are unintentional.  [name] M.D    [facility]   CC: Primary care physician; Michail Sermon, MDPatient ID: Morgan Carpenter, female   DOB: 1951/03/28, 69 y.o.   MRN: [id number]

## 2019-08-08 MED ORDER — ACETAMINOPHEN 500 MG PO TABS
500.0000 mg | ORAL_TABLET | Freq: Four times a day (QID) | ORAL | Status: DC | PRN
  Administered 2019-08-08: 17:00:00 500 mg via ORAL
  Filled 2019-08-08 (×2): qty 1

## 2019-08-08 MED ORDER — RIVAROXABAN 20 MG PO TABS
20.0000 mg | ORAL_TABLET | Freq: Every day | ORAL | Status: DC
  Administered 2019-08-08 – 2019-08-09 (×2): 20 mg via ORAL
  Filled 2019-08-08 (×2): qty 1

## 2019-08-08 MED ORDER — TRAMADOL HCL 50 MG PO TABS
50.0000 mg | ORAL_TABLET | Freq: Four times a day (QID) | ORAL | Status: DC | PRN
  Administered 2019-08-08 – 2019-08-09 (×5): 50 mg via ORAL
  Filled 2019-08-08 (×5): qty 1

## 2019-08-08 NOTE — TOC Progression Note (Signed)
Transition of Care [facility]) - Progression Note    Patient Details  Name: Morgan Carpenter MRN: [id number] Date of Birth: [birth year]  Transition [facility] Care [facility]) CM/SW Contact  Nero Sawatzky, Gardiner Rhyme, LCSW Phone Number: 08/08/2019, 1:53 PM  Clinical Narrative:   Spoke with husband via telephone to discuss plan once appeal is decided. He feels he can not provide the care pt will need at discharge due to how she has declined since getting this UTI. He is very upset with [facility] for not treating her UTI and it getting to this level. He has spoken with his insurance agent who informed him once $ 3,600 deductible met all will be covered. Discussed unsure if this is so and they still may have co-pay in SNF since used 20 days already. He plans to call his agent again. He is on board with having a plan B for when appeal decided. If decision is SNF will still need to get insurance auth before can go. Made husband aware once decision is made has until 12:00 the next day to DC. Will try to work with him on best plan for pt, although it seems pt has declined and is plus 2 assist now. Which is too much care for husband.    Expected Discharge Plan: Home/Self Care Barriers to Discharge: Continued Medical Work up  Expected Discharge Plan and Services Expected Discharge Plan: Home/Self Care In-house Referral: Clinical Social Work     Living arrangements for the past 2 months: Single Family Home Expected Discharge Date: 08/06/19                                     Social Determinants of Health (SDOH) Interventions    Readmission Risk Interventions No flowsheet data found.

## 2019-08-08 NOTE — Progress Notes (Signed)
[place] [facility] [place] NAME: Morgan Carpenter    MR#:  [id number]  DATE OF BIRTH:  [birth year]  SUBJECTIVE:  no vomiting today. Tolerating PO diet.. Patient asking to be started back on her Xarelto. REVIEW OF SYSTEMS:   Review of Systems  Constitutional: Negative for chills, fever and weight loss.  HENT: Negative for ear discharge, ear pain and nosebleeds.   Eyes: Negative for blurred vision, pain and discharge.  Respiratory: Negative for sputum production, shortness of breath, wheezing and stridor.   Cardiovascular: Negative for chest pain, palpitations, orthopnea and PND.  Gastrointestinal: Negative for abdominal pain, diarrhea, nausea and vomiting.  Genitourinary: Negative for frequency and urgency.  Musculoskeletal: Positive for falls and joint pain. Negative for back pain.  Neurological: Positive for weakness. Negative for sensory change, speech change and focal weakness.  Psychiatric/Behavioral: Negative for depression and hallucinations. The patient is not nervous/anxious.    Tolerating Diet:yes Tolerating PT: rec rehab  DRUG ALLERGIES:   Allergies  Allergen Reactions  . Naprosyn [Naproxen] Hives  . Shellfish Allergy Hives  . Sulfa Antibiotics Hives    VITALS:  Blood pressure 124/67, pulse 86, temperature 98.7 F (37.1 C), temperature source Oral, resp. rate 18, height 5\' 4"  (1.626 m), weight 83.5 kg, SpO2 99 %.  PHYSICAL EXAMINATION:   Physical Exam  GENERAL:  69 y.o.-year-old patient lying in the bed with no acute distress.obese Deconditioned,appears chronically ill  EYES: Pupils equal, round, reactive to light and accommodation. No scleral icterus.   HEENT: Head atraumatic, normocephalic. Oropharynx and nasopharynx clear.  NECK:  Supple, no jugular venous distention. No thyroid enlargement, no tenderness.  LUNGS: Normal breath sounds bilaterally, no wheezing, rales, rhonchi. No use of accessory muscles of respiration.   CARDIOVASCULAR: S1, S2 normal. No murmurs, rubs, or gallops.  ABDOMEN: Soft, nontender, nondistended. Bowel sounds present. No organomegaly or mass.  EXTREMITIES: No cyanosis, clubbing or edema b/l.   DJD changes + NEUROLOGIC: Cranial nerves II through XII are intact. No focal Motor or sensory deficits b/l.  Weakness generalized PSYCHIATRIC:  patient is alert and oriented x 3.  SKIN: No obvious rash, lesion, or ulcer.   LABORATORY PANEL:  CBC Recent Labs  Lab 08/02/19 0632  WBC 4.1  HGB 11.2*  HCT 35.9*  PLT 245    Chemistries  Recent Labs  Lab 08/03/19 0415  NA 137  K 3.6  CL 110  CO2 21*  GLUCOSE 92  BUN 8  CREATININE 0.92  CALCIUM 7.9*  AST 62*  ALT 64*  ALKPHOS 60  BILITOT 0.6   Cardiac Enzymes No results for input(s): TROPONINI in the last 168 hours. RADIOLOGY:  No results found. ASSESSMENT AND PLAN:  69 year old female with neurosarcoidosis, adrenal insufficiency, chronic left MCA stroke, DJD involving lumbar and cervical spine, osteoarthritis, history of DVT and hypopituitarism along with obstructive sleep apnea presented with bilateral knee pain (more prominent on the left) with inability to walk. Symptoms started about 2 days prior to admission with swelling and tenderness but no fever. She was discharged from short-term rehab on 4/23. She was seen by orthopedics 1 month back regarding her low back pain and imaging suggested cervical myelopathy and lumbar stenosis/spondylolisthesis with plan on C3-C5 PCDF at [facility].  Complicated UTI (urinary tract infection)- -With associated hematuria and intolerant to p.o. antibiotic. - Culture growing Citrobacter.   Received IV Rocephin.  Dysuria and hematuria now resolved.  Based on sensitivity will change to  oral nitrofurantoin for 1  more days to complete 7-day course of antibiotic. -afebrile -wbc stable  Chronic known h/o Cervical myelopathy and severe lumbar spondylosis/lumbar spinal stenosis. -Concern for  increasing weakness of the left leg  - MRI of the lumbar spine with contrast which showed severe L3-L4 spinal canal stenosis with moderate bilateral neural foraminal stenosis. It appears similar to MRI of the lumbar spine done and reported at [facility] 6 weeks back. -Consulted Neurology (Dr. Loretha Carpenter) who evaluated the patient and recommended this is likely due to the knee pain and does not appear to be acute central neurological process. No further imaging needed.--pt to f/u with [facility]  -[facility] is being [facility]) with [facility] on [facility] decomp[facility] done at [facility]. Informs that she has an appointment June 9th  -PT recommended SNF.  Bilateral knee pain (L >R)with lower extremity weakness. -Severe pain and fall at home. - X-ray of the knee showing degenerative changes without fracture or effusion.  -Pain control with scheduled Tylenol, added tramadol as needed.  Hypothyroidism Continue Synthroid  Chronic depression Continue Prozac  Transaminitis Mild.  CK mildly elevated, now resolved.  Follow-up LFTs improved.  Statin resumed.  Constipation with nausea and vomiting -Dulcolax suppository today -No BM in 3 days -avoid narcotics  H/o PE and DVT--has IVC filter palced in 2014 (per [facility] records) -cont xarelto  Procedures:none Family communication :Husband Morgan Carpenter on the phone  Consults :Neurology CODE STATUS: Full DVT Prophylaxis :lovenox   Status is: Inpatient  Dispo: The patient is from: Home              Anticipated d/c is to: TBD              Anticipated d/c date: TBD              Patient currently stable best at baseline but awaiting insurance appeal outcome that was made 5/5/201by Morgan Carpenter (husband)   TOTAL TIME TAKING CARE OF THIS PATIENT: 25 minutes.  >50% time spent on counselling and coordination of care  Note: This dictation was prepared with Dragon dictation along with smaller phrase  technology. Any transcriptional errors that result from this process are unintentional.  Morgan Carpenter M.D    [facility]   CC: Primary care physician; Morgan Carpenter, MDPatient ID: Morgan Carpenter, female   DOB: Sep 15, 1950, 69 y.o.   MRN: [id number]

## 2019-08-09 DIAGNOSIS — M4800 Spinal stenosis, site unspecified: Secondary | ICD-10-CM | POA: Diagnosis not present

## 2019-08-09 DIAGNOSIS — E876 Hypokalemia: Secondary | ICD-10-CM | POA: Diagnosis not present

## 2019-08-09 DIAGNOSIS — D649 Anemia, unspecified: Secondary | ICD-10-CM | POA: Diagnosis not present

## 2019-08-09 DIAGNOSIS — M2550 Pain in unspecified joint: Secondary | ICD-10-CM | POA: Diagnosis not present

## 2019-08-09 DIAGNOSIS — E7800 Pure hypercholesterolemia, unspecified: Secondary | ICD-10-CM | POA: Diagnosis not present

## 2019-08-09 DIAGNOSIS — Z79899 Other long term (current) drug therapy: Secondary | ICD-10-CM | POA: Diagnosis not present

## 2019-08-09 DIAGNOSIS — R5381 Other malaise: Secondary | ICD-10-CM | POA: Diagnosis not present

## 2019-08-09 DIAGNOSIS — E230 Hypopituitarism: Secondary | ICD-10-CM | POA: Diagnosis not present

## 2019-08-09 DIAGNOSIS — E870 Hyperosmolality and hypernatremia: Secondary | ICD-10-CM | POA: Diagnosis not present

## 2019-08-09 DIAGNOSIS — A4181 Sepsis due to Enterococcus: Secondary | ICD-10-CM | POA: Diagnosis not present

## 2019-08-09 DIAGNOSIS — E871 Hypo-osmolality and hyponatremia: Secondary | ICD-10-CM | POA: Diagnosis not present

## 2019-08-09 DIAGNOSIS — R400 Somnolence: Secondary | ICD-10-CM | POA: Diagnosis not present

## 2019-08-09 DIAGNOSIS — M5000 Cervical disc disorder with myelopathy, unspecified cervical region: Secondary | ICD-10-CM | POA: Diagnosis not present

## 2019-08-09 DIAGNOSIS — G9341 Metabolic encephalopathy: Secondary | ICD-10-CM | POA: Diagnosis not present

## 2019-08-09 DIAGNOSIS — M6281 Muscle weakness (generalized): Secondary | ICD-10-CM | POA: Diagnosis not present

## 2019-08-09 DIAGNOSIS — N179 Acute kidney failure, unspecified: Secondary | ICD-10-CM | POA: Diagnosis not present

## 2019-08-09 DIAGNOSIS — I69351 Hemiplegia and hemiparesis following cerebral infarction affecting right dominant side: Secondary | ICD-10-CM | POA: Diagnosis not present

## 2019-08-09 DIAGNOSIS — L89152 Pressure ulcer of sacral region, stage 2: Secondary | ICD-10-CM | POA: Diagnosis not present

## 2019-08-09 DIAGNOSIS — M25561 Pain in right knee: Secondary | ICD-10-CM | POA: Diagnosis not present

## 2019-08-09 DIAGNOSIS — L89322 Pressure ulcer of left buttock, stage 2: Secondary | ICD-10-CM | POA: Diagnosis not present

## 2019-08-09 DIAGNOSIS — Z7401 Bed confinement status: Secondary | ICD-10-CM | POA: Diagnosis not present

## 2019-08-09 DIAGNOSIS — R319 Hematuria, unspecified: Secondary | ICD-10-CM | POA: Diagnosis not present

## 2019-08-09 DIAGNOSIS — E039 Hypothyroidism, unspecified: Secondary | ICD-10-CM | POA: Diagnosis not present

## 2019-08-09 DIAGNOSIS — A419 Sepsis, unspecified organism: Secondary | ICD-10-CM | POA: Diagnosis not present

## 2019-08-09 DIAGNOSIS — R6520 Severe sepsis without septic shock: Secondary | ICD-10-CM | POA: Diagnosis not present

## 2019-08-09 DIAGNOSIS — R0689 Other abnormalities of breathing: Secondary | ICD-10-CM | POA: Diagnosis not present

## 2019-08-09 DIAGNOSIS — D869 Sarcoidosis, unspecified: Secondary | ICD-10-CM | POA: Diagnosis not present

## 2019-08-09 DIAGNOSIS — F909 Attention-deficit hyperactivity disorder, unspecified type: Secondary | ICD-10-CM | POA: Diagnosis present

## 2019-08-09 DIAGNOSIS — Z20822 Contact with and (suspected) exposure to covid-19: Secondary | ICD-10-CM | POA: Diagnosis not present

## 2019-08-09 DIAGNOSIS — I824Y9 Acute embolism and thrombosis of unspecified deep veins of unspecified proximal lower extremity: Secondary | ICD-10-CM | POA: Diagnosis not present

## 2019-08-09 DIAGNOSIS — Z7901 Long term (current) use of anticoagulants: Secondary | ICD-10-CM | POA: Diagnosis not present

## 2019-08-09 DIAGNOSIS — R000 Tachycardia, unspecified: Secondary | ICD-10-CM | POA: Diagnosis not present

## 2019-08-09 DIAGNOSIS — E272 Addisonian crisis: Secondary | ICD-10-CM | POA: Diagnosis not present

## 2019-08-09 DIAGNOSIS — M25562 Pain in left knee: Secondary | ICD-10-CM | POA: Diagnosis not present

## 2019-08-09 DIAGNOSIS — E785 Hyperlipidemia, unspecified: Secondary | ICD-10-CM | POA: Diagnosis not present

## 2019-08-09 DIAGNOSIS — E569 Vitamin deficiency, unspecified: Secondary | ICD-10-CM | POA: Diagnosis not present

## 2019-08-09 DIAGNOSIS — R531 Weakness: Secondary | ICD-10-CM | POA: Diagnosis not present

## 2019-08-09 DIAGNOSIS — I69320 Aphasia following cerebral infarction: Secondary | ICD-10-CM | POA: Diagnosis not present

## 2019-08-09 DIAGNOSIS — N390 Urinary tract infection, site not specified: Secondary | ICD-10-CM | POA: Diagnosis not present

## 2019-08-09 DIAGNOSIS — M48061 Spinal stenosis, lumbar region without neurogenic claudication: Secondary | ICD-10-CM | POA: Diagnosis not present

## 2019-08-09 DIAGNOSIS — Z743 Need for continuous supervision: Secondary | ICD-10-CM | POA: Diagnosis not present

## 2019-08-09 DIAGNOSIS — Z79890 Hormone replacement therapy (postmenopausal): Secondary | ICD-10-CM | POA: Diagnosis not present

## 2019-08-09 DIAGNOSIS — R404 Transient alteration of awareness: Secondary | ICD-10-CM | POA: Diagnosis not present

## 2019-08-09 DIAGNOSIS — G4733 Obstructive sleep apnea (adult) (pediatric): Secondary | ICD-10-CM | POA: Diagnosis not present

## 2019-08-09 DIAGNOSIS — G92 Toxic encephalopathy: Secondary | ICD-10-CM | POA: Diagnosis not present

## 2019-08-09 DIAGNOSIS — N3000 Acute cystitis without hematuria: Secondary | ICD-10-CM | POA: Diagnosis not present

## 2019-08-09 DIAGNOSIS — I6350 Cerebral infarction due to unspecified occlusion or stenosis of unspecified cerebral artery: Secondary | ICD-10-CM | POA: Diagnosis not present

## 2019-08-09 DIAGNOSIS — M50221 Other cervical disc displacement at C4-C5 level: Secondary | ICD-10-CM | POA: Diagnosis not present

## 2019-08-09 DIAGNOSIS — Z86718 Personal history of other venous thrombosis and embolism: Secondary | ICD-10-CM | POA: Diagnosis not present

## 2019-08-09 DIAGNOSIS — F329 Major depressive disorder, single episode, unspecified: Secondary | ICD-10-CM | POA: Diagnosis present

## 2019-08-09 LAB — RESPIRATORY PANEL BY RT PCR (FLU A&B, COVID)
Influenza A by PCR: NEGATIVE
Influenza B by PCR: NEGATIVE
SARS Coronavirus 2 by RT PCR: NEGATIVE

## 2019-08-09 MED ORDER — PANTOPRAZOLE SODIUM 40 MG PO TBEC: 40.0000 mg | DELAYED_RELEASE_TABLET | Freq: Every day | ORAL | 0 refills | Status: AC

## 2019-08-09 MED ORDER — RIVAROXABAN 20 MG PO TABS: 20.0000 mg | ORAL_TABLET | Freq: Every day | ORAL | 0 refills | Status: AC

## 2019-08-09 MED ORDER — BISACODYL 5 MG PO TBEC
5.0000 mg | DELAYED_RELEASE_TABLET | Freq: Every morning | ORAL | Status: DC
  Administered 2019-08-09: 09:00:00 5 mg via ORAL
  Filled 2019-08-09: qty 1

## 2019-08-09 NOTE — TOC Progression Note (Addendum)
Transition of Care [facility]) - Progression Note    Patient Details  Name: NELWYN HEBDON MRN: [id number] Date of Birth: January 04, 1951  [facility]) CM/SW Contact  Tiyon Sanor, Lemar Livings, LCSW Phone Number: 08/09/2019, 7:59 AM  Clinical Narrative: Spoke with husband who informs worker Asa Lente has denied his appeal and he wants to go ahead and pursue [facility]. He has secured 7 days of payment and can have access to more later. Informed will need to check with [facility] to see if bed available and to start insurance auth, hopefully can make happen today. Husband to call [facility] and see if needs to ask for re-consideration in case does not discharge today.    8:10 am faxed clinicals to [facility] for auth ref number [id number]  11:58 Spoke with [facility] who will call husband to discuss payment. Husband has filed re-consideration from [place]. COVID test negative.   12:42 Received insurance auth from [place] ref [id number] start date today 5/7 and next update 5/11. Have informed [facility] of this-sent information. Will call EMS when bedside RN ready. Husband is aware along with pt.  Expected Discharge Plan: Home/Self Care Barriers to Discharge: Continued Medical Work up  Expected Discharge Plan and Services Expected Discharge Plan: Home/Self Care In-house Referral: Clinical Social Work     Living arrangements for the past 2 months: Single Family Home Expected Discharge Date: 08/06/19                                     Social Determinants of Health (SDOH) Interventions    Readmission Risk Interventions No flowsheet data found.

## 2019-08-09 NOTE — Progress Notes (Signed)
Pt to [facility] at this time via ems no c/o

## 2019-08-09 NOTE — Progress Notes (Signed)
[facility] to be discharged to [facility] via [facility] this  Pm. Alert. No  Distress.  R/a.  Vss.  Report called and given to [facility].  Ems has been called for transport and  Waiting on them for transfer. Sl d/cd.

## 2019-08-09 NOTE — Plan of Care (Signed)

## 2019-08-09 NOTE — Discharge Summary (Signed)
[facility] [place] [facility]   PATIENT NAME: Morgan Carpenter    MR#:  [id number]  DATE OF BIRTH:  09/08/1950  DATE OF ADMISSION:  08/01/2019 ADMITTING PHYSICIAN: Lucile Shuttersochukwu Agbata, MD  DATE OF DISCHARGE: 08/09/2019  PRIMARY CARE PHYSICIAN: Michail SermonBell, Andre E, MD    ADMISSION DIAGNOSIS:  Sarcoidosis [D86.9] Lower urinary tract infectious disease [N39.0] Weakness [R53.1] Transaminitis [R74.01] DVT (deep venous thrombosis) (HCC) [I82.409] Complicated UTI (urinary tract infection) [N39.0] Pain in both knees, unspecified chronicity [M25.561, M25.562]  DISCHARGE DIAGNOSIS:  Complicated UTI--recurrent Chronic known Cervical Myelopathy and severe lumbar spondylosis with lumbar spinal stenosis  SECONDARY DIAGNOSIS:   Past Medical History:  Diagnosis Date  . Adrenal insufficiency (HCC)   . DDD (degenerative disc disease), cervical   . DDD (degenerative disc disease), lumbar   . Hemiplegia (HCC)   . Hypercholesteremia   . Hyperprolactinemia (HCC)   . Hypopituitarism (HCC)   . Lymphocytic hypophysitis (HCC)   . OSA (obstructive sleep apnea)   . Patient is Jehovah's Witness   . Recurrent deep vein thrombosis (DVT) (HCC)   . Sarcoidosis   . Stroke (HCC)   . Thyroid disease   . Vitamin D deficiency     HOSPITAL COURSE:   69 year old female with neurosarcoidosis, adrenal insufficiency, chronic left MCA stroke, DJD involving lumbar and cervical spine, osteoarthritis, history of DVT and hypopituitarism along with obstructive sleep apnea presented with bilateral knee pain (more prominent on the left) with inability to walk. Symptoms started about 2 days prior to admission with swelling and tenderness but no fever. She was discharged from short-term rehab on 4/23. She was seen by orthopedics 1 month back regarding her low back pain and imaging suggested cervical myelopathy and lumbar stenosis/spondylolisthesis with [facility]  Complicated UTI (urinary  tract infection)- -With associated hematuria and intolerant to p.o. antibiotic. -Culture growing Citrobacter.  -Received IV Rocephin. Dysuria and hematuria now resolved.  -completed 7-day course of antibiotic. -afebrile -wbc stable  Chronic known h/o Cervical myelopathy and severe lumbar spondylosis/lumbar spinal stenosis. -Concern for increasing weakness of the left leg  - MRI of the lumbar spine with contrast which showed severe L3-L4 spinal canal stenosis with moderate bilateral neural foraminal stenosis. It appears similar to [facility] of the lumbar spine [facility]. -Consulted Neurology (Dr. Loretha BrasilZeylikman) who evaluated the patient and recommended this is likely due to the knee pain and does not appear to be acute central neurological process. No further imaging needed.--pt to f/u with [facility]  -[facility] is being [facility] with [facility]. Informs that she has an appointment June 9th  -PT recommended SNF.  Bilateral knee pain (L >R)with lower extremity weakness. -Severe pain and fall at home. - X-ray of the knee showing degenerative changes without fracture or effusion.  -Pain control with scheduled Tylenol, added tramadol as needed.  Hypothyroidism Continue Synthroid  Chronic depression Continue Prozac  Transaminitis Mild. CK mildly elevated, now resolved. Follow-up LFTs improved.  Statin resumed.  Constipation with nausea and vomiting -pt having regular BM's -avoid narcotics  H/o PE and DVT--has IVC filter palced in 2014 (per [facility] records) -cont xarelto  Procedures:none Family communication :Husband Vergie LivingLarry Horton on the phone  Consults :Neurology CODE STATUS: Full DVT Prophylaxis :lovenox   Status is: Inpatient  Dispo: The patient is from: Home  Anticipated d/c is to: Rehab  Anticipated d/c date: may  7th  Patient currently stable best at baseline. Per TOC to [facility] and insurance auth obtained Repeat COVID negative CONSULTS OBTAINED:    DRUG ALLERGIES:   Allergies  Allergen Reactions  . Naprosyn [Naproxen] Hives  . Shellfish Allergy Hives  . Sulfa Antibiotics Hives    DISCHARGE MEDICATIONS:   Allergies as of 08/09/2019      Reactions   Naprosyn [naproxen] Hives   Shellfish Allergy Hives   Sulfa Antibiotics Hives      Medication List    TAKE these medications   acetaminophen 500 MG tablet Commonly known as: TYLENOL Take 2 tablets (1,000 mg total) by mouth every 8 (eight) hours.   atorvastatin 40 MG tablet Commonly known as: LIPITOR Take 40 mg by mouth daily.   FLUoxetine 10 MG capsule Commonly known as: PROZAC Take 10 mg by mouth daily.   gabapentin 300 MG capsule Commonly known as: NEURONTIN PLEASE SEE ATTACHED FOR DETAILED DIRECTIONS   levothyroxine 75 MCG tablet Commonly known as: SYNTHROID PLEASE SEE ATTACHED FOR DETAILED DIRECTIONS   multivitamin capsule Take by mouth.   pantoprazole 40 MG tablet Commonly known as: PROTONIX Take 1 tablet (40 mg total) by mouth daily. Start taking on: Aug 10, 2019   rivaroxaban 20 MG Tabs tablet Commonly known as: XARELTO Take 1 tablet (20 mg total) by mouth daily. Start taking on: Aug 10, 2019   tiZANidine 2 MG tablet Commonly known as: ZANAFLEX Take 2 mg by mouth at bedtime as needed.   traMADol 50 MG tablet Commonly known as: Ultram Take 1 tablet (50 mg total) by mouth every 8 (eight) hours as needed for up to 5 days.       If you experience worsening of your admission symptoms, develop shortness of breath, life threatening emergency, suicidal or homicidal thoughts you must seek medical attention immediately by calling 911 or calling your MD immediately  if symptoms less severe.  You Must read complete instructions/literature along with all the possible adverse reactions/side effects  for all the Medicines you take and that have been prescribed to you. Take any new Medicines after you have completely understood and accept all the possible adverse reactions/side effects.   Please note  You were cared for by a hospitalist during your hospital stay. If you have any questions about your discharge medications or the care you received while you were in the hospital after you are discharged, you can call the unit and asked to speak with the hospitalist on call if the hospitalist that took care of you is not available. Once you are discharged, your primary care physician will handle any further medical issues. Please note that NO REFILLS for any discharge medications will be authorized once you are discharged, as it is imperative that you return to your primary care physician (or establish a relationship with a primary care physician if you do not have one) for your aftercare needs so that they can reassess your need for medications and monitor your lab values.   DATA REVIEW:   CBC  No results for input(s): WBC, HGB, HCT, PLT in the last 168 hours.  Chemistries  Recent Labs  Lab 08/03/19 0415  NA 137  K 3.6  CL 110  CO2 21*  GLUCOSE 92  BUN 8  CREATININE 0.92  CALCIUM 7.9*  AST 62*  ALT 64*  ALKPHOS 60  BILITOT 0.6    Microbiology Results   Recent Results (from the past 240 hour(s))  Respiratory Panel by RT PCR (Flu A&B,  Covid) - Nasopharyngeal Swab     Status: None   Collection Time: 08/01/19 11:21 AM   Specimen: Nasopharyngeal Swab  Result Value Ref Range Status   SARS Coronavirus 2 by RT PCR NEGATIVE NEGATIVE Final    Comment: (NOTE) SARS-CoV-2 target nucleic acids are NOT DETECTED. The SARS-CoV-2 RNA is generally detectable in upper respiratoy specimens during the acute phase of infection. The lowest concentration of SARS-CoV-2 viral copies this assay can detect is 131 copies/mL. A negative result does not preclude SARS-Cov-2 infection and should not be  used as the sole basis for treatment or other patient management decisions. A negative result may occur with  improper specimen collection/handling, submission of specimen other than nasopharyngeal swab, presence of viral mutation(s) within the areas targeted by this assay, and inadequate number of viral copies (<131 copies/mL). A negative result must be combined with clinical observations, patient history, and epidemiological information. The expected result is Negative. Fact Sheet for Patients:  https://www.moore.com/ Fact Sheet for Healthcare Providers:  https://www.young.biz/ This test is not yet ap proved or cleared by the [place] FDA and  has been authorized for detection and/or diagnosis of SARS-CoV-2 by FDA under an Emergency Use Authorization (EUA). This EUA will remain  in effect (meaning this test can be used) for the duration of the COVID-19 declaration under Section 564(b)(1) of the Act, 21 U.S.C. section 360bbb-3(b)(1), unless the authorization is terminated or revoked sooner.    Influenza A by PCR NEGATIVE NEGATIVE Final   Influenza B by PCR NEGATIVE NEGATIVE Final    Comment: (NOTE) The Xpert Xpress SARS-CoV-2/FLU/RSV assay is intended as an aid in  the diagnosis of influenza from Nasopharyngeal swab specimens and  should not be used as a sole basis for treatment. Nasal washings and  aspirates are unacceptable for Xpert Xpress SARS-CoV-2/FLU/RSV  testing. Fact Sheet for Patients: https://www.moore.com/ Fact Sheet for Healthcare Providers: https://www.young.biz/ This test is not yet approved or cleared by the [place] FDA and  has been authorized for detection and/or diagnosis of SARS-CoV-2 by  FDA under an Emergency Use Authorization (EUA). This EUA will remain  in effect (meaning this test can be used) for the duration of the  Covid-19 declaration under Section 564(b)(1) of the  Act, 21  U.S.C. section 360bbb-3(b)(1), unless the authorization is  terminated or revoked. Performed at [facility], [address] [place], [place] [address]   Respiratory Panel by RT PCR (Flu A&B, Covid) - Nasopharyngeal Swab     Status: None   Collection Time: 08/05/19  8:25 AM   Specimen: Nasopharyngeal Swab  Result Value Ref Range Status   SARS Coronavirus 2 by RT PCR NEGATIVE NEGATIVE Final    Comment: (NOTE) SARS-CoV-2 target nucleic acids are NOT DETECTED. The SARS-CoV-2 RNA is generally detectable in upper respiratoy specimens during the acute phase of infection. The lowest concentration of SARS-CoV-2 viral copies this assay can detect is 131 copies/mL. A negative result does not preclude SARS-Cov-2 infection and should not be used as the sole basis for treatment or other patient management decisions. A negative result may occur with  improper specimen collection/handling, submission of specimen other than nasopharyngeal swab, presence of viral mutation(s) within the areas targeted by this assay, and inadequate number of viral copies (<131 copies/mL). A negative result must be combined with clinical observations, patient history, and epidemiological information. The expected result is Negative. Fact Sheet for Patients:  https://www.moore.com/ Fact Sheet for Healthcare Providers:  https://www.young.biz/ This test is not yet ap  proved or cleared by the [place] and  has been authorized for detection and/or diagnosis of SARS-CoV-2 by FDA under an Emergency Use Authorization (EUA). This EUA will remain  in effect (meaning this test can be used) for the duration of the COVID-19 declaration under Section 564(b)(1) of the Act, 21 U.S.C. section 360bbb-3(b)(1), unless the authorization is terminated or revoked sooner.    Influenza A by PCR NEGATIVE NEGATIVE Final   Influenza B by PCR NEGATIVE NEGATIVE Final     Comment: (NOTE) The Xpert Xpress SARS-CoV-2/FLU/RSV assay is intended as an aid in  the diagnosis of influenza from Nasopharyngeal swab specimens and  should not be used as a sole basis for treatment. Nasal washings and  aspirates are unacceptable for Xpert Xpress SARS-CoV-2/FLU/RSV  testing. Fact Sheet for Patients: https://www.moore.com/ Fact Sheet for Healthcare Providers: https://www.young.biz/ This test is not yet approved or cleared by the [place] FDA and  has been authorized for detection and/or diagnosis of SARS-CoV-2 by  FDA under an Emergency Use Authorization (EUA). This EUA will remain  in effect (meaning this test can be used) for the duration of the  Covid-19 declaration under Section 564(b)(1) of the Act, 21  U.S.C. section 360bbb-3(b)(1), unless the authorization is  terminated or revoked. Performed at [facility], [address] [place], [place] [address]   Respiratory Panel by RT PCR (Flu A&B, Covid) - Nasopharyngeal Swab     Status: None   Collection Time: 08/09/19  9:29 AM   Specimen: Nasopharyngeal Swab  Result Value Ref Range Status   SARS Coronavirus 2 by RT PCR NEGATIVE NEGATIVE Final    Comment: (NOTE) SARS-CoV-2 target nucleic acids are NOT DETECTED. The SARS-CoV-2 RNA is generally detectable in upper respiratoy specimens during the acute phase of infection. The lowest concentration of SARS-CoV-2 viral copies this assay can detect is 131 copies/mL. A negative result does not preclude SARS-Cov-2 infection and should not be used as the sole basis for treatment or other patient management decisions. A negative result may occur with  improper specimen collection/handling, submission of specimen other than nasopharyngeal swab, presence of viral mutation(s) within the areas targeted by this assay, and inadequate number of viral copies (<131 copies/mL). A negative result must be combined with  clinical observations, patient history, and epidemiological information. The expected result is Negative. Fact Sheet for Patients:  https://www.moore.com/ Fact Sheet for Healthcare Providers:  https://www.young.biz/ This test is not yet ap proved or cleared by the [place] FDA and  has been authorized for detection and/or diagnosis of SARS-CoV-2 by FDA under an Emergency Use Authorization (EUA). This EUA will remain  in effect (meaning this test can be used) for the duration of the COVID-19 declaration under Section 564(b)(1) of the Act, 21 U.S.C. section 360bbb-3(b)(1), unless the authorization is terminated or revoked sooner.    Influenza A by PCR NEGATIVE NEGATIVE Final   Influenza B by PCR NEGATIVE NEGATIVE Final    Comment: (NOTE) The Xpert Xpress SARS-CoV-2/FLU/RSV assay is intended as an aid in  the diagnosis of influenza from Nasopharyngeal swab specimens and  should not be used as a sole basis for treatment. Nasal washings and  aspirates are unacceptable for Xpert Xpress SARS-CoV-2/FLU/RSV  testing. Fact Sheet for Patients: https://www.moore.com/ Fact Sheet for Healthcare Providers: https://www.young.biz/ This test is not yet approved or cleared by the [place] FDA and  has been authorized for detection and/or diagnosis of SARS-CoV-2 by  FDA under an Emergency Use Authorization (EUA). This  EUA will remain  in effect (meaning this test can be used) for the duration of the  Covid-19 declaration under Section 564(b)(1) of the Act, 21  U.S.C. section 360bbb-3(b)(1), unless the authorization is  terminated or revoked. Performed at [facility], [address], [place], [place] [address]     RADIOLOGY:  No results found.   CODE STATUS:     Code Status Orders  (From admission, onward)         Start     Ordered   08/01/19 1142  Full code  Continuous     08/01/19 1143         Code Status History    This patient has a current code status but no historical code status.   Advance Care Planning Activity    Advance Directive Documentation     Most Recent Value  Type of Advance Directive  Living will  Pre-existing out of facility DNR order (yellow form or pink MOST form)  --  "MOST" Form in Place?  --       TOTAL TIME TAKING CARE OF THIS PATIENT: *40* minutes.    Fritzi Mandes M.D  [facility]    CC: Primary care physician; Su Monks, MD

## 2019-08-09 NOTE — TOC Progression Note (Signed)
Transition of Care [facility]) - Progression Note    Patient Details  Name: Morgan Carpenter MRN: [id number] Date of Birth: December 02, 1950  Transition of Care [facility]) CM/SW Contact  Miakoda Mcmillion, Lemar Livings, LCSW Phone Number: 08/09/2019, 1:40 PM  Clinical Narrative:   Spoke with [facility] who reports they need the plan auth ID before she can transfer. Called [employer] who reports the ref number is the auth until the next review date on 5/11. Awaiting Tammy return call regarding what business office wants to do.    Expected Discharge Plan: Home/Self Care Barriers to Discharge: Barriers Resolved  Expected Discharge Plan and Services Expected Discharge Plan: Home/Self Care In-house Referral: Clinical Social Work     Living arrangements for the past 2 months: Single Family Home Expected Discharge Date: 08/09/19                                     Social Determinants of Health (SDOH) Interventions    Readmission Risk Interventions No flowsheet data found.

## 2019-08-09 NOTE — Progress Notes (Signed)
[place] [facility] [place] NAME: Morgan Carpenter    MR#:  [id number]  DATE OF BIRTH:  [birth year]  SUBJECTIVE:  C/o left knee pian--chronic Good BM's so far REVIEW OF SYSTEMS:   Review of Systems  Constitutional: Negative for chills, fever and weight loss.  HENT: Negative for ear discharge, ear pain and nosebleeds.   Eyes: Negative for blurred vision, pain and discharge.  Respiratory: Negative for sputum production, shortness of breath, wheezing and stridor.   Cardiovascular: Negative for chest pain, palpitations, orthopnea and PND.  Gastrointestinal: Negative for abdominal pain, diarrhea, nausea and vomiting.  Genitourinary: Negative for frequency and urgency.  Musculoskeletal: Positive for falls and joint pain. Negative for back pain.  Neurological: Positive for weakness. Negative for sensory change, speech change and focal weakness.  Psychiatric/Behavioral: Negative for depression and hallucinations. The patient is not nervous/anxious.    Tolerating Diet:yes Tolerating PT: rec rehab  DRUG ALLERGIES:   Allergies  Allergen Reactions  . Naprosyn [Naproxen] Hives  . Shellfish Allergy Hives  . Sulfa Antibiotics Hives    VITALS:  Blood pressure 121/71, pulse 93, temperature 98.5 F (36.9 C), temperature source Oral, resp. rate 17, height 5\' 4"  (1.626 m), weight 83.5 kg, SpO2 96 %.  PHYSICAL EXAMINATION:   Physical Exam  GENERAL:  69 y.o.-year-old patient lying in the bed with no acute distress.obese Deconditioned,appears chronically ill  EYES: Pupils equal, round, reactive to light and accommodation. No scleral icterus.   HEENT: Head atraumatic, normocephalic. Oropharynx and nasopharynx clear.  NECK:  Supple, no jugular venous distention. No thyroid enlargement, no tenderness.  LUNGS: Normal breath sounds bilaterally, no wheezing, rales, rhonchi. No use of accessory muscles of respiration.  CARDIOVASCULAR: S1, S2 normal. No murmurs, rubs,  or gallops.  ABDOMEN: Soft, nontender, nondistended. Bowel sounds present. No organomegaly or mass.  EXTREMITIES: No cyanosis, clubbing or edema b/l.   DJD changes + NEUROLOGIC: Cranial nerves II through XII are intact. No focal Motor or sensory deficits b/l.  Weakness generalized PSYCHIATRIC:  patient is alert and oriented x 3.  SKIN: No obvious rash, lesion, or ulcer.   LABORATORY PANEL:  CBC No results for input(s): WBC, HGB, HCT, PLT in the last 168 hours.  Chemistries  Recent Labs  Lab 08/03/19 0415  NA 137  K 3.6  CL 110  CO2 21*  GLUCOSE 92  BUN 8  CREATININE 0.92  CALCIUM 7.9*  AST 62*  ALT 64*  ALKPHOS 60  BILITOT 0.6   Cardiac Enzymes No results for input(s): TROPONINI in the last 168 hours. RADIOLOGY:  No results found. ASSESSMENT AND PLAN:  69 year old female with neurosarcoidosis, adrenal insufficiency, chronic left MCA stroke, DJD involving lumbar and cervical spine, osteoarthritis, history of DVT and hypopituitarism along with obstructive sleep apnea presented with bilateral knee pain (more prominent on the left) with inability to walk. Symptoms started about 2 days prior to admission with swelling and tenderness but no fever. She was discharged from short-term rehab on 4/23. She was seen by orthopedics 1 month back regarding her low back pain and imaging suggested cervical myelopathy and lumbar stenosis/spondylolisthesis with plan on C3-C5 PCDF at [facility]  Complicated UTI (urinary tract infection)- -With associated hematuria and intolerant to p.o. antibiotic. -Culture growing Citrobacter.    -Received IV Rocephin.  Dysuria and hematuria now resolved.  -completed 7-day course of antibiotic. -afebrile -wbc stable  Chronic known h/o Cervical myelopathy and severe lumbar spondylosis/lumbar spinal stenosis. -Concern for increasing weakness of  the left leg  - MRI of the lumbar spine with contrast which showed severe L3-L4 spinal canal stenosis with moderate  bilateral neural foraminal stenosis. It appears similar to MRI of the lumbar spine done and reported at [facility] 6 weeks back. -Consulted Neurology (Dr. Loretha Brasil) who evaluated the patient and recommended this is likely due to the knee pain and does not appear to be acute central neurological process. No further imaging needed.--pt to f/u with [facility] spine  -Patient is being evaluated by [facility]opedics ([facility]) with plan on posterior cervical decompression done at [facility]. Informs that she has an appointment June 9th  -PT recommended SNF.  Bilateral knee pain (L >R)with lower extremity weakness. -Severe pain and fall at home. - X-ray of the knee showing degenerative changes without fracture or effusion.  -Pain control with scheduled Tylenol, added tramadol as needed.  Hypothyroidism Continue Synthroid  Chronic depression Continue Prozac  Transaminitis Mild.  CK mildly elevated, now resolved.  Follow-up LFTs improved.   Statin resumed.  Constipation with nausea and vomiting -pt having regular BM's -avoid narcotics  H/o PE and DVT--has IVC filter palced in 2014 (per [facility] records) -cont xarelto  Procedures:none Family communication :Husband [name] on the phone  Consults :Neurology CODE STATUS: Full DVT Prophylaxis :lovenox   Status is: Inpatient  Dispo: The patient is from: Home              Anticipated d/c is to: Rehab              Anticipated d/c date: TBD--awaiting Auth              Patient currently stable best at baseline. Per TOC--husband reported denial of appeal. Husband is now ok for pt going to [facility]. He has made payment for first 7 days. Awaiting Insurance authorization today Rapid Covid ordered  TOTAL TIME TAKING CARE OF THIS PATIENT: 25 minutes.  >50% time spent on counselling and coordination of care  Note: This dictation was prepared with Dragon dictation along with smaller phrase technology. Any transcriptional errors that result  from this process are unintentional.  [name] M.D    [facility]   CC: Primary care physician; Michail Sermon, MDPatient ID: Morgan Carpenter, female   DOB: 09-28-1950, 69 y.o.   MRN: [id number]

## 2019-08-09 NOTE — TOC Transition Note (Signed)
Transition of Care [facility]) - CM/SW Discharge Note   Patient Details  Name: Morgan Carpenter MRN: [id number] Date of Birth: 30-Jun-1950  Transition [facility]) CM/SW Contact:  Lucy Chris, LCSW Phone Number: 08/09/2019, 1:22 PM   Clinical Narrative:   Pt received insurance auth via [place] ref number [id number] start date 5/7-next review 5/11. Pt and husband aware and agreeable to. Husband has paid the co-pay for the week. MD and bedside RN aware,will call EMS once ready. DC packet in chart. Pierce Crane will be the CM following pt at facility.  [id number]. Report [phone]   Final next level of care: Skilled Nursing Facility Barriers to Discharge: Barriers Resolved   Patient Goals and CMS Choice Patient states their goals for this hospitalization and ongoing recovery are:: I want to feel better before going home, this infection just kept getting worse      Discharge Placement   Existing PASRR number confirmed : 08/05/19          Patient chooses bed at: [facility] Patient to be transferred to facility by: EMS Name of family member notified: [name] husband Patient and family notified of of transfer: 08/09/19  Discharge Plan and Services In-house Referral: Clinical Social Work                                   Social Determinants of Health (SDOH) Interventions     Readmission Risk Interventions No flowsheet data found.

## 2019-08-13 DIAGNOSIS — M25561 Pain in right knee: Secondary | ICD-10-CM | POA: Diagnosis not present

## 2019-08-13 DIAGNOSIS — M48061 Spinal stenosis, lumbar region without neurogenic claudication: Secondary | ICD-10-CM | POA: Diagnosis not present

## 2019-08-13 DIAGNOSIS — M5000 Cervical disc disorder with myelopathy, unspecified cervical region: Secondary | ICD-10-CM | POA: Diagnosis not present

## 2019-08-13 DIAGNOSIS — M25562 Pain in left knee: Secondary | ICD-10-CM | POA: Diagnosis not present

## 2019-08-13 DIAGNOSIS — M6281 Muscle weakness (generalized): Secondary | ICD-10-CM | POA: Diagnosis not present

## 2019-08-15 DIAGNOSIS — M4800 Spinal stenosis, site unspecified: Secondary | ICD-10-CM | POA: Diagnosis not present

## 2019-08-19 ENCOUNTER — Other Ambulatory Visit: Payer: Self-pay | Admitting: Physician Assistant

## 2019-08-19 DIAGNOSIS — M4802 Spinal stenosis, cervical region: Secondary | ICD-10-CM

## 2019-08-21 ENCOUNTER — Inpatient Hospital Stay
Admission: EM | Admit: 2019-08-21 | Discharge: 2019-09-10 | DRG: 871 | Disposition: A | Discharge status: Skilled Nursing Facility | Payer: Medicare Other | Attending: Internal Medicine | Admitting: Internal Medicine

## 2019-08-21 ENCOUNTER — Ambulatory Visit
Admission: RE | Admit: 2019-08-21 | Discharge: 2019-08-21 | Disposition: A | Discharge status: Home / Self Care | Payer: Medicare Other | Source: Ambulatory Visit | Attending: Physician Assistant | Admitting: Physician Assistant

## 2019-08-21 ENCOUNTER — Other Ambulatory Visit: Payer: Self-pay

## 2019-08-21 ENCOUNTER — Emergency Department: Payer: Medicare Other

## 2019-08-21 DIAGNOSIS — G039 Meningitis, unspecified: Secondary | ICD-10-CM

## 2019-08-21 DIAGNOSIS — Z79890 Hormone replacement therapy (postmenopausal): Secondary | ICD-10-CM

## 2019-08-21 DIAGNOSIS — Z882 Allergy status to sulfonamides status: Secondary | ICD-10-CM

## 2019-08-21 DIAGNOSIS — I69351 Hemiplegia and hemiparesis following cerebral infarction affecting right dominant side: Secondary | ICD-10-CM | POA: Diagnosis not present

## 2019-08-21 DIAGNOSIS — Z86718 Personal history of other venous thrombosis and embolism: Secondary | ICD-10-CM

## 2019-08-21 DIAGNOSIS — Z803 Family history of malignant neoplasm of breast: Secondary | ICD-10-CM

## 2019-08-21 DIAGNOSIS — L89152 Pressure ulcer of sacral region, stage 2: Secondary | ICD-10-CM | POA: Diagnosis present

## 2019-08-21 DIAGNOSIS — A4181 Sepsis due to Enterococcus: Principal | ICD-10-CM | POA: Diagnosis present

## 2019-08-21 DIAGNOSIS — R0689 Other abnormalities of breathing: Secondary | ICD-10-CM | POA: Diagnosis not present

## 2019-08-21 DIAGNOSIS — G9341 Metabolic encephalopathy: Secondary | ICD-10-CM

## 2019-08-21 DIAGNOSIS — I824Y9 Acute embolism and thrombosis of unspecified deep veins of unspecified proximal lower extremity: Secondary | ICD-10-CM

## 2019-08-21 DIAGNOSIS — E039 Hypothyroidism, unspecified: Secondary | ICD-10-CM | POA: Diagnosis not present

## 2019-08-21 DIAGNOSIS — Z8249 Family history of ischemic heart disease and other diseases of the circulatory system: Secondary | ICD-10-CM

## 2019-08-21 DIAGNOSIS — Z743 Need for continuous supervision: Secondary | ICD-10-CM | POA: Diagnosis not present

## 2019-08-21 DIAGNOSIS — E876 Hypokalemia: Secondary | ICD-10-CM

## 2019-08-21 DIAGNOSIS — M4802 Spinal stenosis, cervical region: Secondary | ICD-10-CM

## 2019-08-21 DIAGNOSIS — H5210 Myopia, unspecified eye: Secondary | ICD-10-CM | POA: Diagnosis present

## 2019-08-21 DIAGNOSIS — Z9049 Acquired absence of other specified parts of digestive tract: Secondary | ICD-10-CM

## 2019-08-21 DIAGNOSIS — N3000 Acute cystitis without hematuria: Secondary | ICD-10-CM | POA: Diagnosis not present

## 2019-08-21 DIAGNOSIS — F329 Major depressive disorder, single episode, unspecified: Secondary | ICD-10-CM | POA: Diagnosis not present

## 2019-08-21 DIAGNOSIS — R404 Transient alteration of awareness: Secondary | ICD-10-CM | POA: Diagnosis not present

## 2019-08-21 DIAGNOSIS — Z881 Allergy status to other antibiotic agents status: Secondary | ICD-10-CM

## 2019-08-21 DIAGNOSIS — I82409 Acute embolism and thrombosis of unspecified deep veins of unspecified lower extremity: Secondary | ICD-10-CM

## 2019-08-21 DIAGNOSIS — D869 Sarcoidosis, unspecified: Secondary | ICD-10-CM | POA: Diagnosis present

## 2019-08-21 DIAGNOSIS — M25561 Pain in right knee: Secondary | ICD-10-CM | POA: Diagnosis not present

## 2019-08-21 DIAGNOSIS — I69320 Aphasia following cerebral infarction: Secondary | ICD-10-CM | POA: Diagnosis not present

## 2019-08-21 DIAGNOSIS — E870 Hyperosmolality and hypernatremia: Secondary | ICD-10-CM

## 2019-08-21 DIAGNOSIS — Z86711 Personal history of pulmonary embolism: Secondary | ICD-10-CM

## 2019-08-21 DIAGNOSIS — E785 Hyperlipidemia, unspecified: Secondary | ICD-10-CM

## 2019-08-21 DIAGNOSIS — L8990 Pressure ulcer of unspecified site, unspecified stage: Secondary | ICD-10-CM | POA: Insufficient documentation

## 2019-08-21 DIAGNOSIS — R4702 Dysphasia: Secondary | ICD-10-CM | POA: Diagnosis present

## 2019-08-21 DIAGNOSIS — R000 Tachycardia, unspecified: Secondary | ICD-10-CM | POA: Diagnosis not present

## 2019-08-21 DIAGNOSIS — N3281 Overactive bladder: Secondary | ICD-10-CM | POA: Diagnosis present

## 2019-08-21 DIAGNOSIS — M50221 Other cervical disc displacement at C4-C5 level: Secondary | ICD-10-CM | POA: Diagnosis not present

## 2019-08-21 DIAGNOSIS — N390 Urinary tract infection, site not specified: Secondary | ICD-10-CM | POA: Diagnosis not present

## 2019-08-21 DIAGNOSIS — G92 Toxic encephalopathy: Secondary | ICD-10-CM | POA: Diagnosis not present

## 2019-08-21 DIAGNOSIS — Z87440 Personal history of urinary (tract) infections: Secondary | ICD-10-CM

## 2019-08-21 DIAGNOSIS — E272 Addisonian crisis: Secondary | ICD-10-CM | POA: Diagnosis not present

## 2019-08-21 DIAGNOSIS — E7800 Pure hypercholesterolemia, unspecified: Secondary | ICD-10-CM | POA: Diagnosis not present

## 2019-08-21 DIAGNOSIS — Z833 Family history of diabetes mellitus: Secondary | ICD-10-CM

## 2019-08-21 DIAGNOSIS — Z7952 Long term (current) use of systemic steroids: Secondary | ICD-10-CM

## 2019-08-21 DIAGNOSIS — G4733 Obstructive sleep apnea (adult) (pediatric): Secondary | ICD-10-CM | POA: Diagnosis not present

## 2019-08-21 DIAGNOSIS — Z7901 Long term (current) use of anticoagulants: Secondary | ICD-10-CM

## 2019-08-21 DIAGNOSIS — L89312 Pressure ulcer of right buttock, stage 2: Secondary | ICD-10-CM | POA: Diagnosis present

## 2019-08-21 DIAGNOSIS — R6520 Severe sepsis without septic shock: Secondary | ICD-10-CM | POA: Diagnosis present

## 2019-08-21 DIAGNOSIS — Z79899 Other long term (current) drug therapy: Secondary | ICD-10-CM

## 2019-08-21 DIAGNOSIS — L89322 Pressure ulcer of left buttock, stage 2: Secondary | ICD-10-CM | POA: Diagnosis present

## 2019-08-21 DIAGNOSIS — R400 Somnolence: Secondary | ICD-10-CM | POA: Diagnosis not present

## 2019-08-21 DIAGNOSIS — E230 Hypopituitarism: Secondary | ICD-10-CM | POA: Diagnosis not present

## 2019-08-21 DIAGNOSIS — N179 Acute kidney failure, unspecified: Secondary | ICD-10-CM | POA: Diagnosis not present

## 2019-08-21 DIAGNOSIS — R531 Weakness: Secondary | ICD-10-CM | POA: Diagnosis not present

## 2019-08-21 DIAGNOSIS — Z20822 Contact with and (suspected) exposure to covid-19: Secondary | ICD-10-CM | POA: Diagnosis not present

## 2019-08-21 DIAGNOSIS — Z91013 Allergy to seafood: Secondary | ICD-10-CM

## 2019-08-21 DIAGNOSIS — F909 Attention-deficit hyperactivity disorder, unspecified type: Secondary | ICD-10-CM | POA: Diagnosis present

## 2019-08-21 DIAGNOSIS — I6350 Cerebral infarction due to unspecified occlusion or stenosis of unspecified cerebral artery: Secondary | ICD-10-CM | POA: Diagnosis not present

## 2019-08-21 DIAGNOSIS — A419 Sepsis, unspecified organism: Secondary | ICD-10-CM | POA: Diagnosis not present

## 2019-08-21 DIAGNOSIS — M48061 Spinal stenosis, lumbar region without neurogenic claudication: Secondary | ICD-10-CM | POA: Diagnosis present

## 2019-08-21 DIAGNOSIS — Z886 Allergy status to analgesic agent status: Secondary | ICD-10-CM

## 2019-08-21 DIAGNOSIS — E871 Hypo-osmolality and hyponatremia: Secondary | ICD-10-CM | POA: Diagnosis not present

## 2019-08-21 DIAGNOSIS — F32A Depression, unspecified: Secondary | ICD-10-CM

## 2019-08-21 DIAGNOSIS — M25562 Pain in left knee: Secondary | ICD-10-CM | POA: Diagnosis not present

## 2019-08-21 LAB — CBC WITH DIFFERENTIAL/PLATELET
Abs Immature Granulocytes: 0.04 10*3/uL (ref 0.00–0.07)
Basophils Absolute: 0.1 10*3/uL (ref 0.0–0.1)
Basophils Relative: 1 %
Eosinophils Absolute: 0.1 10*3/uL (ref 0.0–0.5)
Eosinophils Relative: 1 %
HCT: 39.1 % (ref 36.0–46.0)
Hemoglobin: 12.9 g/dL (ref 12.0–15.0)
Immature Granulocytes: 0 %
Lymphocytes Relative: 33 %
Lymphs Abs: 3.8 10*3/uL (ref 0.7–4.0)
MCH: 28 pg (ref 26.0–34.0)
MCHC: 33 g/dL (ref 30.0–36.0)
MCV: 84.8 fL (ref 80.0–100.0)
Monocytes Absolute: 1.4 10*3/uL — ABNORMAL HIGH (ref 0.1–1.0)
Monocytes Relative: 12 %
Neutro Abs: 6.3 10*3/uL (ref 1.7–7.7)
Neutrophils Relative %: 53 %
Platelets: 786 10*3/uL — ABNORMAL HIGH (ref 150–400)
RBC: 4.61 MIL/uL (ref 3.87–5.11)
RDW: 16.9 % — ABNORMAL HIGH (ref 11.5–15.5)
WBC: 11.8 10*3/uL — ABNORMAL HIGH (ref 4.0–10.5)
nRBC: 0.3 % — ABNORMAL HIGH (ref 0.0–0.2)

## 2019-08-21 LAB — COMPREHENSIVE METABOLIC PANEL
ALT: 43 U/L (ref 0–44)
AST: 141 U/L — ABNORMAL HIGH (ref 15–41)
Albumin: 2.8 g/dL — ABNORMAL LOW (ref 3.5–5.0)
Alkaline Phosphatase: 93 U/L (ref 38–126)
Anion gap: 14 (ref 5–15)
BUN: 13 mg/dL (ref 8–23)
CO2: 27 mmol/L (ref 22–32)
Calcium: 9.1 mg/dL (ref 8.9–10.3)
Chloride: 100 mmol/L (ref 98–111)
Creatinine, Ser: 2.14 mg/dL — ABNORMAL HIGH (ref 0.44–1.00)
GFR calc Af Amer: 27 mL/min — ABNORMAL LOW (ref >60)
GFR calc non Af Amer: 23 mL/min — ABNORMAL LOW (ref >60)
Glucose, Bld: 95 mg/dL (ref 70–99)
Potassium: 3.9 mmol/L (ref 3.5–5.1)
Sodium: 141 mmol/L (ref 135–145)
Total Bilirubin: 1 mg/dL (ref 0.3–1.2)
Total Protein: 6.5 g/dL (ref 6.5–8.1)

## 2019-08-21 LAB — LACTIC ACID, PLASMA: Lactic Acid, Venous: 4.6 mmol/L (ref 0.5–1.9)

## 2019-08-21 MED ORDER — SODIUM CHLORIDE 0.9 % IV BOLUS
1000.0000 mL | Freq: Once | INTRAVENOUS | Status: AC
  Administered 2019-08-21: 1000 mL via INTRAVENOUS

## 2019-08-21 MED ORDER — GADOBUTROL 1 MMOL/ML IV SOLN
8.0000 mL | Freq: Once | INTRAVENOUS | Status: AC | PRN
  Administered 2019-08-21: 10 mL via INTRAVENOUS

## 2019-08-21 NOTE — ED Notes (Signed)
Blood cultures x2 and rainbow (w lactic) sent to lab.

## 2019-08-21 NOTE — ED Notes (Signed)
Pt transported to CT scan.

## 2019-08-21 NOTE — ED Provider Notes (Signed)
[facility] Emergency Department Provider Note  Time seen: 11:23 PM  I have reviewed the triage vital signs and the nursing notes.   HISTORY  Chief Complaint Altered Mental Status and UTI   HPI Morgan Carpenter is a 69 y.o. female with a past medical history of adrenal insufficiency, hyperlipidemia, sarcoidosis, CVA, presents to the emergency department from her nursing facility for altered mental status.  According to report patient is from [facility] nursing facility and was diagnosed with urinary tract infection yesterday.  They stated today the patient was more confused and altered than her baseline so they sent her to the emergency department for evaluation.  Here the patient is confused, she will answer questions but mostly unintelligible speech.  Unclear what her baseline is.   Past Medical History:  Diagnosis Date  . Adrenal insufficiency (HCC)   . DDD (degenerative disc disease), cervical   . DDD (degenerative disc disease), lumbar   . Hemiplegia (HCC)   . Hypercholesteremia   . Hyperprolactinemia (HCC)   . Hypopituitarism (HCC)   . Lymphocytic hypophysitis (HCC)   . OSA (obstructive sleep apnea)   . Patient is Jehovah's Witness   . Recurrent deep vein thrombosis (DVT) (HCC)   . Sarcoidosis   . Stroke (HCC)   . Thyroid disease   . Vitamin D deficiency     Patient Active Problem List   Diagnosis Date Noted  . Osteoarthritis of knee 08/05/2019  . Transaminitis 08/05/2019  . Weakness 08/05/2019  . Cervical myelopathy (HCC) 08/05/2019  . Complicated UTI (urinary tract infection) 08/01/2019  . Thyroid disease   . Sarcoidosis   . OSA (obstructive sleep apnea)     Past Surgical History:  Procedure Laterality Date  . CHOLECYSTECTOMY    . NOSE SURGERY      Prior to Admission medications   Medication Sig Start Date End Date Taking? Authorizing Provider  acetaminophen (TYLENOL) 500 MG tablet Take 2 tablets (1,000 mg total) by mouth every 8  (eight) hours. 08/05/19   Dhungel, Nishant, MD  atorvastatin (LIPITOR) 40 MG tablet Take 40 mg by mouth daily. 07/21/19   [provider]  FLUoxetine (PROZAC) 10 MG capsule Take 10 mg by mouth daily. 07/13/19   [provider]  gabapentin (NEURONTIN) 300 MG capsule PLEASE SEE ATTACHED FOR DETAILED DIRECTIONS 06/14/19   [provider]  levothyroxine (SYNTHROID) 75 MCG tablet PLEASE SEE ATTACHED FOR DETAILED DIRECTIONS 06/18/19   [provider]  Multiple Vitamin (MULTIVITAMIN) capsule Take by mouth.    [provider]  pantoprazole (PROTONIX) 40 MG tablet Take 1 tablet (40 mg total) by mouth daily. 08/10/19   [name], MD  rivaroxaban (XARELTO) 20 MG TABS tablet Take 1 tablet (20 mg total) by mouth daily. 08/10/19   [name], MD  tiZANidine (ZANAFLEX) 2 MG tablet Take 2 mg by mouth at bedtime as needed. 06/24/19   [provider]    Allergies  Allergen Reactions  . Naprosyn [Naproxen] Hives  . Shellfish Allergy Hives  . Sulfa Antibiotics Hives    No family history on file.  Social History Social History   Tobacco Use  . Smoking status: Never Smoker  . Smokeless tobacco: Never Used  Substance Use Topics  . Alcohol use: Never  . Drug use: Never    Review of Systems Unable to obtain an adequate/accurate review of systems due to confusion/altered mental status  ____________________________________________   PHYSICAL EXAM:  VITAL SIGNS: ED Triage Vitals  Enc  Vitals Group     BP 08/21/19 2259 118/74     Pulse Rate 08/21/19 2259 (!) 128     Resp 08/21/19 2259 (!) 22     Temp 08/21/19 2259 98.8 F (37.1 C)     Temp Source 08/21/19 2259 Oral     SpO2 08/21/19 2259 98 %     Weight 08/21/19 2301 184 lb (83.5 kg)     Height 08/21/19 2301 5\' 4"  (1.626 m)     Head Circumference --      [facility] Flow --      Pain Score --      Pain Loc --      Pain Edu? --      Excl. in GC? --     Constitutional: Patient is somnolent but does  awaken to voice.  She will look at you when speaking, she occasionally attempt to answer questions but is mostly unintelligible. Eyes: Normal exam ENT      Head: Normocephalic and atraumatic.      Mouth/Throat: Mucous membranes are moist. Cardiovascular: Regular rhythm rate around 120 bpm. Respiratory: Normal respiratory effort without tachypnea nor retractions. Breath sounds are clear.  No obvious wheeze rales or rhonchi. Gastrointestinal: Soft and nontender. No distention.  Neurologic: Patient will occasionally mumbles speech.  Will not follow commands.  Unable to get an adequate neurological exam. Skin:  Skin is warm, dry and intact.  Psychiatric: Calm.  ____________________________________________    EKG  EKG viewed and interpreted by myself shows sinus tachycardia 133 bpm with a narrow QRS, normal axis, normal intervals besides slight QTC prolongation, nonspecific ST changes.  ____________________________________________    RADIOLOGY  CT head negative for acute abnormality.  Chest x-ray negative.  ____________________________________________   INITIAL IMPRESSION / ASSESSMENT AND PLAN / ED COURSE  Pertinent labs & imaging results that were available during my care of the patient were reviewed by me and considered in my medical decision making (see chart for details).   Patient presents emergency department for altered mental status.  Per report was diagnosed with urinary tract infection yesterday.  We will check labs, urine.  As the patient is tachycardic we will also check a lactic acid and blood cultures.  We will obtain CT imaging of the head as well as it is unclear what the patient's baseline is.  Repeat temperature is 102.5.  Patient is tachycardic with tachypnea and now febrile meeting sepsis criteria.  I ordered broad-spectrum antibiotics.  Patient's lactate has resulted greater than 4 we will dose 30 mils per kilo of fluids.  I spoke to the hospitalist will be  admitting to their service for further work-up and treatment.  LEAHANNA Carpenter was evaluated in Emergency Department on 08/21/2019 for the symptoms described in the history of present illness. She was evaluated in the context of the global COVID-19 pandemic, which necessitated consideration that the patient might be at risk for infection with the SARS-CoV-2 virus that causes COVID-19. Institutional protocols and algorithms that pertain to the evaluation of patients at risk for COVID-19 are in a state of rapid change based on information released by regulatory bodies including the CDC and federal and state organizations. These policies and algorithms were followed during the patient's care in the ED.  CRITICAL CARE Performed by: 08/23/2019   Total critical care time: 30 minutes  Critical care time was exclusive of separately billable procedures and treating other patients.  Critical care was necessary to treat or prevent imminent or life-threatening  deterioration.  Critical care was time spent personally by me on the following activities: development of treatment plan with patient and/or surrogate as well as nursing, discussions with consultants, evaluation of patient's response to treatment, examination of patient, obtaining history from patient or surrogate, ordering and performing treatments and interventions, ordering and review of laboratory studies, ordering and review of radiographic studies, pulse oximetry and re-evaluation of patient's condition.   ____________________________________________   FINAL CLINICAL IMPRESSION(S) / ED DIAGNOSES  Altered mental status Sepsis   Harvest Dark, MD 08/22/19 [phone]

## 2019-08-21 NOTE — ED Triage Notes (Signed)
[facility] [place] w cc of AMS and diagnosed w UTI yesterday. Pt is "not acting herself" per staff and is normally more talkative than this.  20G R wrist

## 2019-08-22 ENCOUNTER — Other Ambulatory Visit: Payer: Self-pay

## 2019-08-22 ENCOUNTER — Emergency Department: Payer: Medicare Other

## 2019-08-22 DIAGNOSIS — G9341 Metabolic encephalopathy: Secondary | ICD-10-CM

## 2019-08-22 DIAGNOSIS — E230 Hypopituitarism: Secondary | ICD-10-CM | POA: Diagnosis present

## 2019-08-22 DIAGNOSIS — D8689 Sarcoidosis of other sites: Secondary | ICD-10-CM | POA: Diagnosis not present

## 2019-08-22 DIAGNOSIS — Z7401 Bed confinement status: Secondary | ICD-10-CM | POA: Diagnosis not present

## 2019-08-22 DIAGNOSIS — R509 Fever, unspecified: Secondary | ICD-10-CM | POA: Diagnosis not present

## 2019-08-22 DIAGNOSIS — A419 Sepsis, unspecified organism: Secondary | ICD-10-CM | POA: Diagnosis not present

## 2019-08-22 DIAGNOSIS — Z79890 Hormone replacement therapy (postmenopausal): Secondary | ICD-10-CM | POA: Diagnosis not present

## 2019-08-22 DIAGNOSIS — A4181 Sepsis due to Enterococcus: Secondary | ICD-10-CM | POA: Diagnosis not present

## 2019-08-22 DIAGNOSIS — M6281 Muscle weakness (generalized): Secondary | ICD-10-CM | POA: Diagnosis not present

## 2019-08-22 DIAGNOSIS — I82403 Acute embolism and thrombosis of unspecified deep veins of lower extremity, bilateral: Secondary | ICD-10-CM | POA: Diagnosis not present

## 2019-08-22 DIAGNOSIS — L89322 Pressure ulcer of left buttock, stage 2: Secondary | ICD-10-CM | POA: Diagnosis present

## 2019-08-22 DIAGNOSIS — E870 Hyperosmolality and hypernatremia: Secondary | ICD-10-CM | POA: Diagnosis not present

## 2019-08-22 DIAGNOSIS — R6520 Severe sepsis without septic shock: Secondary | ICD-10-CM | POA: Diagnosis present

## 2019-08-22 DIAGNOSIS — G4733 Obstructive sleep apnea (adult) (pediatric): Secondary | ICD-10-CM | POA: Diagnosis present

## 2019-08-22 DIAGNOSIS — E871 Hypo-osmolality and hyponatremia: Secondary | ICD-10-CM | POA: Diagnosis not present

## 2019-08-22 DIAGNOSIS — L89152 Pressure ulcer of sacral region, stage 2: Secondary | ICD-10-CM | POA: Diagnosis present

## 2019-08-22 DIAGNOSIS — M2550 Pain in unspecified joint: Secondary | ICD-10-CM | POA: Diagnosis not present

## 2019-08-22 DIAGNOSIS — K760 Fatty (change of) liver, not elsewhere classified: Secondary | ICD-10-CM | POA: Diagnosis not present

## 2019-08-22 DIAGNOSIS — R4701 Aphasia: Secondary | ICD-10-CM | POA: Diagnosis not present

## 2019-08-22 DIAGNOSIS — M24561 Contracture, right knee: Secondary | ICD-10-CM | POA: Diagnosis not present

## 2019-08-22 DIAGNOSIS — F329 Major depressive disorder, single episode, unspecified: Secondary | ICD-10-CM | POA: Diagnosis present

## 2019-08-22 DIAGNOSIS — E272 Addisonian crisis: Secondary | ICD-10-CM | POA: Diagnosis not present

## 2019-08-22 DIAGNOSIS — E7800 Pure hypercholesterolemia, unspecified: Secondary | ICD-10-CM | POA: Diagnosis present

## 2019-08-22 DIAGNOSIS — I69320 Aphasia following cerebral infarction: Secondary | ICD-10-CM | POA: Diagnosis not present

## 2019-08-22 DIAGNOSIS — G9340 Encephalopathy, unspecified: Secondary | ICD-10-CM | POA: Diagnosis not present

## 2019-08-22 DIAGNOSIS — R400 Somnolence: Secondary | ICD-10-CM | POA: Diagnosis not present

## 2019-08-22 DIAGNOSIS — Z20822 Contact with and (suspected) exposure to covid-19: Secondary | ICD-10-CM | POA: Diagnosis present

## 2019-08-22 DIAGNOSIS — I69351 Hemiplegia and hemiparesis following cerebral infarction affecting right dominant side: Secondary | ICD-10-CM | POA: Diagnosis not present

## 2019-08-22 DIAGNOSIS — Z86718 Personal history of other venous thrombosis and embolism: Secondary | ICD-10-CM | POA: Diagnosis not present

## 2019-08-22 DIAGNOSIS — N390 Urinary tract infection, site not specified: Secondary | ICD-10-CM | POA: Diagnosis not present

## 2019-08-22 DIAGNOSIS — F909 Attention-deficit hyperactivity disorder, unspecified type: Secondary | ICD-10-CM | POA: Diagnosis present

## 2019-08-22 DIAGNOSIS — E785 Hyperlipidemia, unspecified: Secondary | ICD-10-CM | POA: Diagnosis present

## 2019-08-22 DIAGNOSIS — E039 Hypothyroidism, unspecified: Secondary | ICD-10-CM | POA: Diagnosis not present

## 2019-08-22 DIAGNOSIS — E876 Hypokalemia: Secondary | ICD-10-CM | POA: Diagnosis not present

## 2019-08-22 DIAGNOSIS — R278 Other lack of coordination: Secondary | ICD-10-CM | POA: Diagnosis not present

## 2019-08-22 DIAGNOSIS — Z743 Need for continuous supervision: Secondary | ICD-10-CM | POA: Diagnosis not present

## 2019-08-22 DIAGNOSIS — E2749 Other adrenocortical insufficiency: Secondary | ICD-10-CM | POA: Diagnosis not present

## 2019-08-22 DIAGNOSIS — Z7901 Long term (current) use of anticoagulants: Secondary | ICD-10-CM | POA: Diagnosis not present

## 2019-08-22 DIAGNOSIS — M24562 Contracture, left knee: Secondary | ICD-10-CM | POA: Diagnosis not present

## 2019-08-22 DIAGNOSIS — I824Y9 Acute embolism and thrombosis of unspecified deep veins of unspecified proximal lower extremity: Secondary | ICD-10-CM | POA: Diagnosis not present

## 2019-08-22 DIAGNOSIS — R1311 Dysphagia, oral phase: Secondary | ICD-10-CM | POA: Diagnosis not present

## 2019-08-22 DIAGNOSIS — M4316 Spondylolisthesis, lumbar region: Secondary | ICD-10-CM | POA: Diagnosis not present

## 2019-08-22 DIAGNOSIS — M62838 Other muscle spasm: Secondary | ICD-10-CM | POA: Diagnosis not present

## 2019-08-22 DIAGNOSIS — N179 Acute kidney failure, unspecified: Secondary | ICD-10-CM | POA: Diagnosis present

## 2019-08-22 DIAGNOSIS — R5381 Other malaise: Secondary | ICD-10-CM | POA: Diagnosis not present

## 2019-08-22 DIAGNOSIS — N3000 Acute cystitis without hematuria: Secondary | ICD-10-CM | POA: Diagnosis not present

## 2019-08-22 DIAGNOSIS — G92 Toxic encephalopathy: Secondary | ICD-10-CM | POA: Diagnosis present

## 2019-08-22 LAB — CBC WITH DIFFERENTIAL/PLATELET
Abs Immature Granulocytes: 0.03 10*3/uL (ref 0.00–0.07)
Basophils Absolute: 0.1 10*3/uL (ref 0.0–0.1)
Basophils Relative: 1 %
Eosinophils Absolute: 0.1 10*3/uL (ref 0.0–0.5)
Eosinophils Relative: 1 %
HCT: 37.4 % (ref 36.0–46.0)
Hemoglobin: 12.3 g/dL (ref 12.0–15.0)
Immature Granulocytes: 0 %
Lymphocytes Relative: 29 %
Lymphs Abs: 2.5 10*3/uL (ref 0.7–4.0)
MCH: 27.6 pg (ref 26.0–34.0)
MCHC: 32.9 g/dL (ref 30.0–36.0)
MCV: 83.9 fL (ref 80.0–100.0)
Monocytes Absolute: 1.1 10*3/uL — ABNORMAL HIGH (ref 0.1–1.0)
Monocytes Relative: 13 %
Neutro Abs: 4.9 10*3/uL (ref 1.7–7.7)
Neutrophils Relative %: 56 %
Platelets: 521 10*3/uL — ABNORMAL HIGH (ref 150–400)
RBC: 4.46 MIL/uL (ref 3.87–5.11)
RDW: 17 % — ABNORMAL HIGH (ref 11.5–15.5)
WBC: 8.7 10*3/uL (ref 4.0–10.5)
nRBC: 0 % (ref 0.0–0.2)

## 2019-08-22 LAB — COMPREHENSIVE METABOLIC PANEL
ALT: 37 U/L (ref 0–44)
AST: 119 U/L — ABNORMAL HIGH (ref 15–41)
Albumin: 2.3 g/dL — ABNORMAL LOW (ref 3.5–5.0)
Alkaline Phosphatase: 75 U/L (ref 38–126)
Anion gap: 8 (ref 5–15)
BUN: 13 mg/dL (ref 8–23)
CO2: 24 mmol/L (ref 22–32)
Calcium: 7.8 mg/dL — ABNORMAL LOW (ref 8.9–10.3)
Chloride: 109 mmol/L (ref 98–111)
Creatinine, Ser: 1.64 mg/dL — ABNORMAL HIGH (ref 0.44–1.00)
GFR calc Af Amer: 37 mL/min — ABNORMAL LOW (ref >60)
GFR calc non Af Amer: 32 mL/min — ABNORMAL LOW (ref >60)
Glucose, Bld: 104 mg/dL — ABNORMAL HIGH (ref 70–99)
Potassium: 4 mmol/L (ref 3.5–5.1)
Sodium: 141 mmol/L (ref 135–145)
Total Bilirubin: 1 mg/dL (ref 0.3–1.2)
Total Protein: 5.4 g/dL — ABNORMAL LOW (ref 6.5–8.1)

## 2019-08-22 LAB — PROCALCITONIN: Procalcitonin: 37.93 ng/mL

## 2019-08-22 LAB — PROTIME-INR
INR: 7 (ref 0.8–1.2)
Prothrombin Time: 58.4 seconds — ABNORMAL HIGH (ref 11.4–15.2)

## 2019-08-22 LAB — URINALYSIS, COMPLETE (UACMP) WITH MICROSCOPIC
Bacteria, UA: NONE SEEN
Bilirubin Urine: NEGATIVE
Glucose, UA: NEGATIVE mg/dL
Ketones, ur: NEGATIVE mg/dL
Nitrite: NEGATIVE
Protein, ur: 100 mg/dL — AB
RBC / HPF: >50 RBC/hpf — ABNORMAL HIGH (ref 0–5)
Specific Gravity, Urine: 1.03 (ref 1.005–1.030)
WBC, UA: >50 WBC/hpf — ABNORMAL HIGH (ref 0–5)
pH: 5 (ref 5.0–8.0)

## 2019-08-22 LAB — LACTIC ACID, PLASMA
Lactic Acid, Venous: 3.2 mmol/L (ref 0.5–1.9)
Lactic Acid, Venous: 2.3 mmol/L (ref 0.5–1.9)
Lactic Acid, Venous: 1.9 mmol/L (ref 0.5–1.9)

## 2019-08-22 LAB — SARS CORONAVIRUS 2 BY RT PCR (HOSPITAL ORDER, PERFORMED IN ~~LOC~~ HOSPITAL LAB): SARS Coronavirus 2: NEGATIVE

## 2019-08-22 LAB — APTT: aPTT: 88 seconds — ABNORMAL HIGH (ref 24–36)

## 2019-08-22 MED ORDER — SODIUM CHLORIDE 0.9 % IV BOLUS
1000.0000 mL | Freq: Once | INTRAVENOUS | Status: AC
  Administered 2019-08-22: 1000 mL via INTRAVENOUS

## 2019-08-22 MED ORDER — ONDANSETRON HCL 4 MG PO TABS: 4.0000 mg | ORAL_TABLET | Freq: Four times a day (QID) | ORAL | Status: DC | PRN

## 2019-08-22 MED ORDER — ATORVASTATIN CALCIUM 20 MG PO TABS
40.0000 mg | ORAL_TABLET | Freq: Every day | ORAL | Status: DC
  Administered 2019-08-22 – 2019-09-09 (×19): 40 mg via ORAL
  Filled 2019-08-22 (×19): qty 2

## 2019-08-22 MED ORDER — MAGNESIUM HYDROXIDE 400 MG/5ML PO SUSP: 30.0000 mL | Freq: Every day | ORAL | Status: DC | PRN

## 2019-08-22 MED ORDER — SODIUM CHLORIDE 0.9 % IV SOLN: INTRAVENOUS | Status: DC

## 2019-08-22 MED ORDER — SODIUM CHLORIDE 0.9 % IV SOLN: 1.0000 g | INTRAVENOUS | Status: DC

## 2019-08-22 MED ORDER — ACETAMINOPHEN 325 MG PO TABS
650.0000 mg | ORAL_TABLET | Freq: Four times a day (QID) | ORAL | Status: DC | PRN
  Administered 2019-08-22 – 2019-09-08 (×17): 650 mg via ORAL
  Filled 2019-08-22 (×17): qty 2

## 2019-08-22 MED ORDER — GABAPENTIN 100 MG PO CAPS
200.0000 mg | ORAL_CAPSULE | Freq: Three times a day (TID) | ORAL | Status: DC
  Administered 2019-08-22 – 2019-08-25 (×11): 200 mg via ORAL
  Filled 2019-08-22 (×13): qty 2

## 2019-08-22 MED ORDER — SODIUM CHLORIDE 0.9 % IV SOLN
2.0000 g | Freq: Two times a day (BID) | INTRAVENOUS | Status: DC
  Administered 2019-08-22 – 2019-08-24 (×5): 2 g via INTRAVENOUS
  Filled 2019-08-22 (×7): qty 2

## 2019-08-22 MED ORDER — RIVAROXABAN 20 MG PO TABS: 20.0000 mg | ORAL_TABLET | Freq: Every day | ORAL | Status: DC

## 2019-08-22 MED ORDER — VANCOMYCIN HCL 1250 MG/250ML IV SOLN
1250.0000 mg | INTRAVENOUS | Status: DC
  Administered 2019-08-23 (×2): 1250 mg via INTRAVENOUS
  Filled 2019-08-22 (×3): qty 250

## 2019-08-22 MED ORDER — SODIUM CHLORIDE 0.9 % IV SOLN
2.0000 g | Freq: Once | INTRAVENOUS | Status: AC
  Administered 2019-08-22: 2 g via INTRAVENOUS
  Filled 2019-08-22: qty 2

## 2019-08-22 MED ORDER — TRAZODONE HCL 50 MG PO TABS
25.0000 mg | ORAL_TABLET | Freq: Every evening | ORAL | Status: DC | PRN
  Administered 2019-08-24: 25 mg via ORAL
  Filled 2019-08-22: qty 1

## 2019-08-22 MED ORDER — VANCOMYCIN HCL IN DEXTROSE 1-5 GM/200ML-% IV SOLN
1000.0000 mg | Freq: Once | INTRAVENOUS | Status: AC
  Administered 2019-08-22: 1000 mg via INTRAVENOUS
  Filled 2019-08-22: qty 200

## 2019-08-22 MED ORDER — PANTOPRAZOLE SODIUM 40 MG PO TBEC
40.0000 mg | DELAYED_RELEASE_TABLET | Freq: Every day | ORAL | Status: DC
  Administered 2019-08-22 – 2019-09-10 (×20): 40 mg via ORAL
  Filled 2019-08-22 (×20): qty 1

## 2019-08-22 MED ORDER — PREDNISONE 10 MG PO TABS
5.0000 mg | ORAL_TABLET | Freq: Every day | ORAL | Status: DC
  Administered 2019-08-23 – 2019-08-26 (×4): 5 mg via ORAL
  Filled 2019-08-22 (×4): qty 1

## 2019-08-22 MED ORDER — TIZANIDINE HCL 2 MG PO TABS
2.0000 mg | ORAL_TABLET | Freq: Three times a day (TID) | ORAL | Status: DC
  Administered 2019-08-22 – 2019-08-23 (×2): 2 mg via ORAL
  Filled 2019-08-22 (×7): qty 1

## 2019-08-22 MED ORDER — METRONIDAZOLE IN NACL 5-0.79 MG/ML-% IV SOLN
500.0000 mg | Freq: Once | INTRAVENOUS | Status: AC
  Administered 2019-08-22: 500 mg via INTRAVENOUS
  Filled 2019-08-22: qty 100

## 2019-08-22 MED ORDER — VANCOMYCIN HCL 500 MG/100ML IV SOLN
500.0000 mg | Freq: Once | INTRAVENOUS | Status: AC
  Administered 2019-08-22: 500 mg via INTRAVENOUS
  Filled 2019-08-22: qty 100

## 2019-08-22 MED ORDER — ONDANSETRON HCL 4 MG/2ML IJ SOLN: 4.0000 mg | Freq: Four times a day (QID) | INTRAMUSCULAR | Status: DC | PRN

## 2019-08-22 MED ORDER — LEVOTHYROXINE SODIUM 50 MCG PO TABS
75.0000 ug | ORAL_TABLET | Freq: Every day | ORAL | Status: DC
  Administered 2019-08-22 – 2019-08-25 (×4): 75 ug via ORAL
  Filled 2019-08-22: qty 1
  Filled 2019-08-22: qty 2
  Filled 2019-08-22 (×2): qty 1

## 2019-08-22 MED ORDER — ADULT MULTIVITAMIN W/MINERALS CH
1.0000 | ORAL_TABLET | Freq: Every day | ORAL | Status: DC
  Administered 2019-08-23 – 2019-09-10 (×19): 1 via ORAL
  Filled 2019-08-22 (×20): qty 1

## 2019-08-22 MED ORDER — FLUOXETINE HCL 10 MG PO CAPS
10.0000 mg | ORAL_CAPSULE | Freq: Every day | ORAL | Status: DC
  Administered 2019-08-22 – 2019-08-29 (×8): 10 mg via ORAL
  Filled 2019-08-22 (×8): qty 1

## 2019-08-22 MED ORDER — ACETAMINOPHEN 650 MG RE SUPP: 650.0000 mg | Freq: Four times a day (QID) | RECTAL | Status: DC | PRN

## 2019-08-22 MED ORDER — ACETAMINOPHEN 325 MG PO TABS
ORAL_TABLET | ORAL | Status: AC
  Administered 2019-08-22: 650 mg via ORAL
  Filled 2019-08-22: qty 2

## 2019-08-22 NOTE — ED Notes (Signed)
Pt refusing to take oral medications at this time. Pt with eyes open, but confused. Pt still not making logical sentences at this time. Pt cleaned and changed into new brief by this RN and caitlyn, NT. New bed sheets applied to bed as well.

## 2019-08-22 NOTE — Progress Notes (Signed)
PHARMACY -  BRIEF ANTIBIOTIC NOTE   Pharmacy has received consult(s) for vanc/cefepime from an ED provider.  The patient's profile has been reviewed for ht/wt/allergies/indication/available labs.    One time order(s) placed for vanc 1.5g IV load, cefepime 2g IV x 1  Further antibiotics/pharmacy consults should be ordered by admitting physician if indicated.                       Thank you,  Thomasene Ripple, PharmD, BCPS Clinical Pharmacist 08/22/2019  3:14 AM

## 2019-08-22 NOTE — Consult Note (Addendum)
Pharmacy Antibiotic Note  Morgan Carpenter is a 69 y.o. female admitted on 08/21/2019 with sepsis.  Pharmacy has been consulted for Vancomycin/Cefepime dosing.   Plan: Pt received Cefepime 2g in ED -  Will start Cefepime 2g q12h and monitor renal function closely  Pt received a total load of 1500mg  Vancomycin in ED -  Will start Vancomycin 1250mg  q24 per dosing nomogram and monitor renal function closely for possible adjustment  Scr 2.14 > 1.64  Height: 5\' 4"  (162.6 cm) Weight: 83.5 kg (184 lb) IBW/kg (Calculated) : 54.7  Temp (24hrs), Avg:100.7 F (38.2 C), Min:98.8 F (37.1 C), Max:102.5 F (39.2 C)  Recent Labs  Lab 08/21/19 2317 08/22/19 0109 08/22/19 0355  WBC 11.8*  --  8.7  CREATININE 2.14*  --  1.64*  LATICACIDVEN 4.6* 3.2* 2.3*    Estimated Creatinine Clearance: 34.8 mL/min (A) (by C-G formula based on SCr of 1.64 mg/dL (H)).    Allergies  Allergen Reactions  . Naprosyn [Naproxen] Hives  . Shellfish Allergy Hives  . Sulfa Antibiotics Hives    Antimicrobials this admission: Cefepime 5/20 >> Vancomycin 5/20 >> Metronidazole 5/20 x 1  Dose adjustments this admission: N/A  Microbiology results: 5/19 BCx: NGTD 5/19 UCx: pending  COVID NEG  Thank you for allowing pharmacy to be a part of this patient's care.  6/20, PharmD, BCPS Clinical Pharmacist 08/22/2019 7:39 AM

## 2019-08-22 NOTE — Progress Notes (Signed)
Brief hospitalist update note.  This is a nonbillable note.  Please see same-day H&P for full billable details.  Briefly this is a 69 year old female has had multiple readmissions recently.  She has a resident at short-term rehab at [facility] and was discharged after recent hospital stay.  Patient is unable to provide history so the majority the history was gained by speaking with the patient's husband at bedside.  He is increasingly concerned with her declining level of function.  We spent some time talking about her spinal stenosis.  She does have lumbar spinal stenosis and is being evaluated for an outpatient procedure through physician at [facility].  There was a concern for cervical stenosis however MRI performed on this admission did not support that.  Patient had significantly elevated procalcitonin and INR on admission.  Exact etiology of these are unclear.  Treating for presumptive sepsis with urinary source.  Blood and urine cultures have been taken and are pending.  Will maintain broad-spectrum antibiotics on board until that time.  Mental status is also been poor but baseline is unclear.  Per the patient's husband she is normally able to identify him and speak without difficulty.  He noticed a change in that recently.  He suspects some effects of sedative medications she was given [facility] resources.  On my evaluation the patient is able to identify her name and she follows commands however when asked to identify her husband she is unable to do so.  CT head negative for intracranial process.  If mental status not improved over the next 24 hours we will pursue MRI brain and likely see consultation from neurology.  Benigna Delisi [employer]

## 2019-08-22 NOTE — ED Notes (Signed)
Pt husband at bedside. States he will be back in the morning. This RN ensured phone number is in the chart.

## 2019-08-22 NOTE — Progress Notes (Signed)
CODE SEPSIS - PHARMACY COMMUNICATION  **Broad Spectrum Antibiotics should be administered within 1 hour of Sepsis diagnosis**  Time Code Sepsis Called/Page Received: 0019  Antibiotics Ordered: vanc/cefepime/flagyl  Time of 1st antibiotic administration: 0102  Additional action taken by pharmacy:   If necessary, Name of Provider/Nurse Contacted:     Thomasene Ripple ,PharmD Clinical Pharmacist  08/22/2019  2:36 AM

## 2019-08-22 NOTE — H&P (Signed)
[place] [facility]   PATIENT NAME: Morgan Carpenter    MR#:  [id number]  DATE OF BIRTH:  08/04/1950  DATE OF ADMISSION:  08/21/2019  PRIMARY CARE PHYSICIAN: Michail Sermon, MD   REQUESTING/REFERRING PHYSICIAN: Minna Antis, MD  CHIEF COMPLAINT:   Chief Complaint  Patient presents with  . Altered Mental Status  . UTI    HISTORY OF PRESENT ILLNESS:  Morgan Carpenter  is a 69 y.o. African-American female with a known history of sarcoidosis, obstructive sleep apnea, hypopituitarism, dyslipidemia and CVA, who presented to the emergency room with acute onset of altered mental status with incoherence and confusion lately.  The patient was diagnosed with UTI yesterday and was up apparently placed on outpatient oral antibiotic at [facility] where she resides.  The patient admitted to urinary urgency without frequency or dysuria or hematuria or flank pain.  She denied any cough or wheezing or dyspnea or chest pain.  She is a fairly poor historian due to her altered mental status though.  Her husband was in the ER and just left stating he will get some sleep and be back in the morning.  Upon presentation to the emergency room, initial vital signs were within normal and later respiratory rate was 22 and pulse rate was 128, which later on came down to 114.  Labs revealed a positive UA for UTI and lactic acid of 4.6 and later 3.2.  CMP showed a BUN of 13 and creatinine of 2.14 up from 8/0.92.  The patient was given broad-spectrum antibiotic therapy with IV cefepime, Flagyl and vancomycin as well as 3 L bolus of IV normal saline.  She will be admitted to a progressive unit bed for further evaluation and management. PAST MEDICAL HISTORY:   Past Medical History:  Diagnosis Date  . Adrenal insufficiency (HCC)   . DDD (degenerative disc disease), cervical   . DDD (degenerative disc disease), lumbar   . Hemiplegia (HCC)   . Hypercholesteremia   . Hyperprolactinemia (HCC)   .  Hypopituitarism (HCC)   . Lymphocytic hypophysitis (HCC)   . OSA (obstructive sleep apnea)   . Patient is Jehovah's Witness   . Recurrent deep vein thrombosis (DVT) (HCC)   . Sarcoidosis   . Stroke (HCC)   . Thyroid disease   . Vitamin D deficiency     PAST SURGICAL HISTORY:   Past Surgical History:  Procedure Laterality Date  . CHOLECYSTECTOMY    . NOSE SURGERY      SOCIAL HISTORY:   Social History   Tobacco Use  . Smoking status: Never Smoker  . Smokeless tobacco: Never Used  Substance Use Topics  . Alcohol use: Never    FAMILY HISTORY:  No family history on file.  DRUG ALLERGIES:   Allergies  Allergen Reactions  . Naprosyn [Naproxen] Hives  . Shellfish Allergy Hives  . Sulfa Antibiotics Hives    REVIEW OF SYSTEMS:   ROS As per history of present illness. All pertinent systems were reviewed above. Constitutional,  HEENT, cardiovascular, respiratory, GI, GU, musculoskeletal, neuro, psychiatric, endocrine,  integumentary and hematologic systems were reviewed and are otherwise  negative/unremarkable except for positive findings mentioned above in the HPI.   MEDICATIONS AT HOME:   Prior to Admission medications   Medication Sig Start Date End Date Taking? Authorizing Provider  acetaminophen (TYLENOL) 500 MG tablet Take 2 tablets (1,000 mg total) by mouth every 8 (eight) hours. 08/05/19   Dhungel, Theda Belfast, MD  atorvastatin (LIPITOR)  40 MG tablet Take 40 mg by mouth daily. 07/21/19   [provider]  FLUoxetine (PROZAC) 10 MG capsule Take 10 mg by mouth daily. 07/13/19   [provider]  gabapentin (NEURONTIN) 300 MG capsule PLEASE SEE ATTACHED FOR DETAILED DIRECTIONS 06/14/19   [provider]  levothyroxine (SYNTHROID) 75 MCG tablet PLEASE SEE ATTACHED FOR DETAILED DIRECTIONS 06/18/19   [provider]  Multiple Vitamin (MULTIVITAMIN) capsule Take by mouth.    [provider]  pantoprazole (PROTONIX) 40 MG tablet Take 1  tablet (40 mg total) by mouth daily. 08/10/19   Fritzi Mandes, MD  rivaroxaban (XARELTO) 20 MG TABS tablet Take 1 tablet (20 mg total) by mouth daily. 08/10/19   Fritzi Mandes, MD  tiZANidine (ZANAFLEX) 2 MG tablet Take 2 mg by mouth at bedtime as needed. 06/24/19   [provider]      VITAL SIGNS:  Blood pressure 132/75, pulse (!) 119, temperature (!) 102.5 F (39.2 C), temperature source Rectal, resp. rate 19, height 5\' 4"  (1.626 m), weight 83.5 kg, SpO2 97 %.  PHYSICAL EXAMINATION:  Physical Exam  GENERAL:  69 y.o.-year-old pleasantly confused elderly African-American female patient lying in the bed with no acute distress.  EYES: Pupils equal, round, reactive to light and accommodation. No scleral icterus. Extraocular muscles intact.  HEENT: Head atraumatic, normocephalic. Oropharynx and nasopharynx clear.  NECK:  Supple, no jugular venous distention. No thyroid enlargement, no tenderness.  LUNGS: Normal breath sounds bilaterally, no wheezing, rales,rhonchi or crepitation. No use of accessory muscles of respiration.  CARDIOVASCULAR: Regular rate and rhythm, S1, S2 normal. No murmurs, rubs, or gallops.  ABDOMEN: Soft, nondistended, nontender. Bowel sounds present. No organomegaly or mass.  EXTREMITIES: No pedal edema, cyanosis, or clubbing.  NEUROLOGIC: Cranial nerves II through XII are intact. Muscle strength 5/5 in all extremities. Sensation intact. Gait not checked.  PSYCHIATRIC: The patient is alert and oriented x 3.  Normal affect and good eye contact. SKIN: No obvious rash, lesion, or ulcer.   LABORATORY PANEL:   CBC Recent Labs  Lab 08/21/19 2317  WBC 11.8*  HGB 12.9  HCT 39.1  PLT 786*   ------------------------------------------------------------------------------------------------------------------  Chemistries  Recent Labs  Lab 08/21/19 2317  NA 141  K 3.9  CL 100  CO2 27  GLUCOSE 95  BUN 13  CREATININE 2.14*  CALCIUM 9.1  AST 141*  ALT 43  ALKPHOS 93   BILITOT 1.0   ------------------------------------------------------------------------------------------------------------------  Cardiac Enzymes No results for input(s): TROPONINI in the last 168 hours. ------------------------------------------------------------------------------------------------------------------  RADIOLOGY:  DG Chest 1 View  Result Date: 08/22/2019 CLINICAL DATA:  Sepsis EXAM: CHEST  1 VIEW COMPARISON:  08/01/2019 FINDINGS: The heart size and mediastinal contours are within normal limits. There is some atelectasis at the left lung base. The visualized skeletal structures are unremarkable. IMPRESSION: No active disease. Electronically Signed   By: Constance Holster M.D.   On: 08/22/2019 00:54   CT Head Wo Contrast  Result Date: 08/21/2019 CLINICAL DATA:  69 year old female with altered mental status, recently diagnosed with UTI. EXAM: CT HEAD WITHOUT CONTRAST TECHNIQUE: Contiguous axial images were obtained from the base of the skull through the vertex without intravenous contrast. COMPARISON:  Cervical spine MRI earlier today. FINDINGS: Brain: Chronic appearing encephalomalacia in the left frontal lobe, left frontal operculum and insula. Mild ex vacuo enlargement of the left lateral ventricle. Superimposed left ICA terminus region embolization coil pack. No midline shift, ventriculomegaly, mass effect, evidence of mass lesion, intracranial hemorrhage or  evidence of cortically based acute infarction. No additional cortical encephalomalacia identified. Vascular: Embolization coil pack at the level of the left ICA terminus, with associated streak artifact. Skull: No acute osseous abnormality identified. Hyperostosis, normal variant. Sinuses/Orbits: Visualized paranasal sinuses and mastoids are well pneumatized. Other: Visualized orbits and scalp soft tissues are within normal limits. IMPRESSION: 1. No acute intracranial abnormality identified. 2. Chronic encephalomalacia in the  left MCA territory, with sequelae of prior aneurysm coil embolization at the left ICA terminus. Electronically Signed   By: Odessa Fleming M.D.   On: 08/21/2019 23:50   MR CERVICAL SPINE W WO CONTRAST  Result Date: 08/21/2019 CLINICAL DATA:  69 year old female with degenerative disease. Difficulty walking for 4 months with bilateral leg pain. Sarcoidosis. EXAM: MRI CERVICAL SPINE WITHOUT AND WITH CONTRAST TECHNIQUE: Multiplanar and multiecho pulse sequences of the cervical spine, to include the craniocervical junction and cervicothoracic junction, were obtained without and with intravenous contrast. CONTRAST:  23mL GADAVIST GADOBUTROL 1 MMOL/ML IV SOLN COMPARISON:  Lumbar MRI 08/06/2019. FINDINGS: Alignment: Straightening and mild reversal of cervical lordosis. No spondylolisthesis. Vertebrae: No marrow edema or evidence of acute osseous abnormality. Visualized bone marrow signal is within normal limits. Chronic degenerative endplate marrow signal changes at some cervical levels. Cord: Marland Kitchen Mildly motion degraded axial The axial T2 imaging T2 * and sagittal imaging is of better quality. Spinal cord signal is within normal limits at all visualized levels. Capacious spinal canal at most levels. No abnormal intradural enhancement. No dural thickening. Posterior Fossa, vertebral arteries, paraspinal tissues: Cervicomedullary junction is within normal limits. Negative visible posterior fossa. Preserved major vascular flow voids in the neck. Negative visible neck soft tissues; diminutive or absent thyroid. Negative visible lung apices. Disc levels: C2-C3: Mild facet hypertrophy. Borderline to mild bilateral C3 foraminal stenosis. C3-C4: Disc space loss with circumferential disc bulge and endplate spurring. Mild facet hypertrophy. Effaced ventral CSF space but no spinal stenosis. Mild to moderate left C4 foraminal stenosis. C4-C5: Disc space loss with circumferential disc bulge and endplate spurring. Mostly foraminal  involvement. No spinal stenosis. Moderate to severe left and mild-to-moderate right C5 foraminal stenosis. C5-C6: Disc space loss with circumferential disc bulge and endplate spurring. Broad-based posterior component of disc effaces the ventral CSF space but without significant spinal stenosis. Moderate bilateral C6 foraminal stenosis, greater on the left. C6-C7: Disc space loss with circumferential disc bulge and endplate spurring. No spinal stenosis. Moderate to severe left C7 foraminal stenosis. Mild right foraminal stenosis. C7-T1: Mild disc bulge. Mild facet hypertrophy. Borderline to mild left C8 foraminal stenosis. No upper thoracic spinal stenosis. IMPRESSION: 1. No acute or inflammatory process in the cervical spine. 2. Widespread cervical disc and endplate degeneration, but fairly capacious underlying spinal canal with no spinal stenosis. 3. Up to moderate and occasionally severe neural foraminal stenosis: Left C4, left C5, bilateral C6, and left C7 nerve levels. Electronically Signed   By: Odessa Fleming M.D.   On: 08/21/2019 18:15      IMPRESSION AND PLAN:   1.  Acute metabolic cephalopathy, likely secondary to UTI with subsequent severe sepsis without septic shock. -The patient will be admitted to a progressive unit bed. -Continue antibiotic therapy with IV Rocephin. -We will follow urine and blood cultures. -We will follow lactic acid levels  2.  Hypothyroidism. -We will continue Synthroid and check TSH level.  3.  Dyslipidemia. -We will continue statin therapy.  4.  Depression. -Continue Prozac.  5.  ADD. -We will continue Adderall XR.  6.  DVT prophylaxis. -Subcutaneous Lovenox.   All the records are reviewed and case discussed with ED provider. The plan of care was discussed in details with the patient (and family). I answered all questions. The patient agreed to proceed with the above mentioned plan. Further management will depend upon hospital course.   CODE STATUS: Full  code  Status is: Inpatient  Remains inpatient appropriate because:Altered mental status, Ongoing diagnostic testing needed not appropriate for outpatient work up, Unsafe d/c plan, IV treatments appropriate due to intensity of illness or inability to take PO and Inpatient level of care appropriate due to severity of illness   Dispo: The patient is from: SNF              Anticipated d/c is to: SNF              Anticipated d/c date is: 3 days              Patient currently is not medically stable to d/c.    TOTAL TIME TAKING CARE OF THIS PATIENT: 55 minutes.    Hannah Beat M.D on 08/22/2019 at 3:42 AM  [facility]   From 7 PM-7 AM, contact night-coverage [email or web address]  CC: Primary care physician; Michail Sermon, MD   Note: This dictation was prepared with Dragon dictation along with smaller phrase technology. Any transcriptional errors that result from this process are unintentional.

## 2019-08-23 ENCOUNTER — Inpatient Hospital Stay: Payer: Medicare Other

## 2019-08-23 DIAGNOSIS — R400 Somnolence: Secondary | ICD-10-CM

## 2019-08-23 DIAGNOSIS — L8990 Pressure ulcer of unspecified site, unspecified stage: Secondary | ICD-10-CM | POA: Insufficient documentation

## 2019-08-23 LAB — CBC WITH DIFFERENTIAL/PLATELET
Abs Immature Granulocytes: 0.04 10*3/uL (ref 0.00–0.07)
Basophils Absolute: 0.1 10*3/uL (ref 0.0–0.1)
Basophils Relative: 1 %
Eosinophils Absolute: 0.5 10*3/uL (ref 0.0–0.5)
Eosinophils Relative: 5 %
HCT: 32.5 % — ABNORMAL LOW (ref 36.0–46.0)
Hemoglobin: 10.4 g/dL — ABNORMAL LOW (ref 12.0–15.0)
Immature Granulocytes: 0 %
Lymphocytes Relative: 28 %
Lymphs Abs: 2.7 10*3/uL (ref 0.7–4.0)
MCH: 27.9 pg (ref 26.0–34.0)
MCHC: 32 g/dL (ref 30.0–36.0)
MCV: 87.1 fL (ref 80.0–100.0)
Monocytes Absolute: 1.3 10*3/uL — ABNORMAL HIGH (ref 0.1–1.0)
Monocytes Relative: 14 %
Neutro Abs: 5 10*3/uL (ref 1.7–7.7)
Neutrophils Relative %: 52 %
Platelets: 622 10*3/uL — ABNORMAL HIGH (ref 150–400)
RBC: 3.73 MIL/uL — ABNORMAL LOW (ref 3.87–5.11)
RDW: 17.1 % — ABNORMAL HIGH (ref 11.5–15.5)
WBC: 9.7 10*3/uL (ref 4.0–10.5)
nRBC: 0 % (ref 0.0–0.2)

## 2019-08-23 LAB — BASIC METABOLIC PANEL
Anion gap: 11 (ref 5–15)
BUN: 17 mg/dL (ref 8–23)
CO2: 22 mmol/L (ref 22–32)
Calcium: 8.3 mg/dL — ABNORMAL LOW (ref 8.9–10.3)
Chloride: 111 mmol/L (ref 98–111)
Creatinine, Ser: 1.31 mg/dL — ABNORMAL HIGH (ref 0.44–1.00)
GFR calc Af Amer: 49 mL/min — ABNORMAL LOW (ref >60)
GFR calc non Af Amer: 42 mL/min — ABNORMAL LOW (ref >60)
Glucose, Bld: 86 mg/dL (ref 70–99)
Potassium: 3.8 mmol/L (ref 3.5–5.1)
Sodium: 144 mmol/L (ref 135–145)

## 2019-08-23 LAB — PROTIME-INR
INR: 4.5 (ref 0.8–1.2)
Prothrombin Time: 41.7 seconds — ABNORMAL HIGH (ref 11.4–15.2)

## 2019-08-23 LAB — PROCALCITONIN: Procalcitonin: 18.46 ng/mL

## 2019-08-23 LAB — CORTISOL: Cortisol, Plasma: 16.3 ug/dL

## 2019-08-23 LAB — MRSA PCR SCREENING: MRSA by PCR: NEGATIVE

## 2019-08-23 NOTE — Progress Notes (Signed)
Patient refused to eat much more than a few bites of breakfast and did not eat any lunch.

## 2019-08-23 NOTE — Progress Notes (Signed)
PROGRESS NOTE    Morgan Carpenter  AUQ:[id number] DOB: Dec 10, 1950 DOA: 08/21/2019 PCP: Michail Sermon, MD  Brief Narrative:  69 year old female has had multiple readmissions recently.  She has a resident at short-term rehab at [facility] and was discharged after recent hospital stay.  Patient is unable to provide history so the majority the history was gained by speaking with the patient's husband at bedside.  He is increasingly concerned with her declining level of function.  We spent some time talking about her spinal stenosis.  She does have lumbar spinal stenosis and is being evaluated for an outpatient procedure through physician at [facility].  There was a concern for cervical stenosis however MRI performed on this admission did not support that.  Patient had significantly elevated procalcitonin and INR on admission.  Exact etiology of these are unclear.  Treating for presumptive sepsis with urinary source.  Blood and urine cultures have been taken and are pending.  Will maintain broad-spectrum antibiotics on board until that time.  5/21: Patient remains significantly altered encephalopathic.  She responds to her name but remains relatively nonverbal and intermittently crying out.  Procalcitonin downtrending.  All cultures remain negative.  No fevers over interval.  Consulted neurology, recommendations appreciated.  Exact etiology of encephalopathy is unclear.  Could be due to polypharmacy but difficult to distinguish in the setting of acute infection.   Assessment & Plan:   Active Problems:   Severe sepsis (HCC)   Pressure injury of skin  Acute toxic encephalopathy Unclear etiology At baseline patient is apparently communicative and expressive Husband has noticed a deterioration in mentation and overall level of health in the past month At this time encephalopathy could be infection driven versus polypharmacy Ideally would get MRI brain however patient is currently unable to follow  commands CT head unrevealing Plan: Continue broad-spectrum antibiotics Follow-up MRI if able Avoid sedatives or anticholinergics Frequent reorienting measures Delirium precautions  Severe sepsis Presumed urinary source All cultures no growth to date Continue broad-spectrum IV antibiotics Follow cultures  Hypothyroidism Continue Synthroid TSH WNL  Hyperlipidemia Continue statin  ADHD On Adderall XR.  This may be contributing factor to encephalopathy  Neurosarcoidosis Lumbar spinal stenosis Patient follows with neurologist and neurosurgeon at [facility] MRI lumbar spine on previous admission did demonstrate lumbar spinal stenosis Cervical spine did not demonstrate any cervical canal stenosis PT and OT consults when able   DVT prophylaxis: SCDs Code Status: Full Family Communication: Husband at bedside Disposition Plan: Status is: Inpatient  Remains inpatient appropriate because:Altered mental status   Dispo: The patient is from: SNF              Anticipated d/c is to: SNF              Anticipated d/c date is: > 3 days              Patient currently is not medically stable to d/c.  Still persistently encephalopathic.  Unclear etiology.  Continue treatment for severe sepsis.        Consultants:   Neurology  Procedures:   None  Antimicrobials:   Vancomycin, 08/22/2019-  Cefepime, 08/22/2019-   Subjective: Seen and examined.  Unable to provide history.  Remains encephalopathic  Objective: Vitals:   08/23/19 0600 08/23/19 0653 08/23/19 0823 08/23/19 1215  BP:   129/73 128/64  Pulse:   (!) 110 (!) 110  Resp: 17 18 20 16   Temp:    99.6 F (37.6 C)  TempSrc:  Oral  SpO2:   96% 100%  Weight:      Height:        Intake/Output Summary (Last 24 hours) at 08/23/2019 1544 Last data filed at 08/23/2019 1433 Gross per 24 hour  Intake 2132.24 ml  Output 500 ml  Net 1632.24 ml   Filed Weights   08/21/19 2301 08/22/19 2115 08/23/19 0525  Weight: 83.5  kg 83.7 kg 84 kg    Examination:  General exam: Encephalopathic.  Appears chronically ill Respiratory system: Normal work of breathing.  Bibasilar crackles Cardiovascular system: Tachycardic, regular rhythm, no murmurs. Gastrointestinal system: Soft, nontender, nondistended, normal bowel sounds Central nervous system: Unable to assess Extremities: Unable to assess Skin: No rashes, lesions or ulcers Psychiatry: Unable to assess, confused   Data Reviewed: I have personally reviewed following labs and imaging studies  CBC: Recent Labs  Lab 08/21/19 2317 08/22/19 0355 08/23/19 0529  WBC 11.8* 8.7 9.7  NEUTROABS 6.3 4.9 5.0  HGB 12.9 12.3 10.4*  HCT 39.1 37.4 32.5*  MCV 84.8 83.9 87.1  PLT 786* 521* 622*   Basic Metabolic Panel: Recent Labs  Lab 08/21/19 2317 08/22/19 0355 08/23/19 0529  NA 141 141 144  K 3.9 4.0 3.8  CL 100 109 111  CO2 27 24 22   GLUCOSE 95 104* 86  BUN 13 13 17   CREATININE 2.14* 1.64* 1.31*  CALCIUM 9.1 7.8* 8.3*   GFR: Estimated Creatinine Clearance: 43.7 mL/min (A) (by C-G formula based on SCr of 1.31 mg/dL (H)). Liver Function Tests: Recent Labs  Lab 08/21/19 2317 08/22/19 0355  AST 141* 119*  ALT 43 37  ALKPHOS 93 75  BILITOT 1.0 1.0  PROT 6.5 5.4*  ALBUMIN 2.8* 2.3*   No results for input(s): LIPASE, AMYLASE in the last 168 hours. No results for input(s): AMMONIA in the last 168 hours. Coagulation Profile: Recent Labs  Lab 08/22/19 0355 08/23/19 0529  INR 7.0* 4.5*   Cardiac Enzymes: No results for input(s): CKTOTAL, CKMB, CKMBINDEX, TROPONINI in the last 168 hours. BNP (last 3 results) No results for input(s): PROBNP in the last 8760 hours. HbA1C: No results for input(s): HGBA1C in the last 72 hours. CBG: No results for input(s): GLUCAP in the last 168 hours. Lipid Profile: No results for input(s): CHOL, HDL, LDLCALC, TRIG, CHOLHDL, LDLDIRECT in the last 72 hours. Thyroid Function Tests: No results for input(s): TSH,  T4TOTAL, FREET4, T3FREE, THYROIDAB in the last 72 hours. Anemia Panel: No results for input(s): VITAMINB12, FOLATE, FERRITIN, TIBC, IRON, RETICCTPCT in the last 72 hours. Sepsis Labs: Recent Labs  Lab 08/21/19 2317 08/22/19 0109 08/22/19 0355 08/22/19 0640 08/23/19 0529  PROCALCITON  --   --  37.93  --  18.46  LATICACIDVEN 4.6* 3.2* 2.3* 1.9  --     Recent Results (from the past 240 hour(s))  Blood culture (routine x 2)     Status: None (Preliminary result)   Collection Time: 08/21/19 11:24 PM   Specimen: Right Antecubital; Blood  Result Value Ref Range Status   Specimen Description RIGHT ANTECUBITAL  Final   Special Requests   Final    BOTTLES DRAWN AEROBIC AND ANAEROBIC Blood Culture results may not be optimal due to an excessive volume of blood received in culture bottles   Culture   Final    NO GROWTH 1 DAY Performed at [facility], [address] [place], [place] [address]    Report Status PENDING  Incomplete  Blood culture (routine x 2)  Status: None (Preliminary result)   Collection Time: 08/21/19 11:29 PM   Specimen: BLOOD RIGHT WRIST  Result Value Ref Range Status   Specimen Description BLOOD RIGHT WRIST  Final   Special Requests   Final    BOTTLES DRAWN AEROBIC AND ANAEROBIC Blood Culture results may not be optimal due to an excessive volume of blood received in culture bottles   Culture   Final    NO GROWTH 1 DAY Performed at [facility], [address], [place], [place] [address]    Report Status PENDING  Incomplete  Urine Culture     Status: Abnormal (Preliminary result)   Collection Time: 08/22/19 12:46 AM   Specimen: Urine, Catheterized  Result Value Ref Range Status   Specimen Description   Final    URINE, CATHETERIZED Performed at [facility], [address], [place], [place] [address]    Special Requests   Final    NONE Performed at [facility], [address] [place], [place] [address]    Culture  40,000 COLONIES/mL ENTEROCOCCUS FAECIUM (A)  Final   Report Status PENDING  Incomplete  SARS Coronavirus 2 by RT PCR (hospital order, performed in [facility] hospital lab) Nasopharyngeal Nasopharyngeal Swab     Status: None   Collection Time: 08/22/19  1:09 AM   Specimen: Nasopharyngeal Swab  Result Value Ref Range Status   SARS Coronavirus 2 NEGATIVE NEGATIVE Final    Comment: (NOTE) SARS-CoV-2 target nucleic acids are NOT DETECTED. The SARS-CoV-2 RNA is generally detectable in upper and lower respiratory specimens during the acute phase of infection. The lowest concentration of SARS-CoV-2 viral copies this assay can detect is 250 copies / mL. A negative result does not preclude SARS-CoV-2 infection and should not be used as the sole basis for treatment or other patient management decisions.  A negative result may occur with improper specimen collection / handling, submission of specimen other than nasopharyngeal swab, presence of viral mutation(s) within the areas targeted by this assay, and inadequate number of viral copies (<250 copies / mL). A negative result must be combined with clinical observations, patient history, and epidemiological information. Fact Sheet for Patients:   BoilerBrush.com.cy Fact Sheet for Healthcare Providers: https://pope.com/ This test is not yet approved or cleared  by the [place] FDA and has been authorized for detection and/or diagnosis of SARS-CoV-2 by FDA under an Emergency Use Authorization (EUA).  This EUA will remain in effect (meaning this test can be used) for the duration of the COVID-19 declaration under Section 564(b)(1) of the Act, 21 U.S.C. section 360bbb-3(b)(1), unless the authorization is terminated or revoked sooner. Performed at [facility], [address] [place], [place] [address]   MRSA PCR Screening     Status: None   Collection Time: 08/23/19  2:45 AM    Specimen: Nasopharyngeal  Result Value Ref Range Status   MRSA by PCR NEGATIVE NEGATIVE Final    Comment:        The GeneXpert MRSA Assay (FDA approved for NASAL specimens only), is one component of a comprehensive MRSA colonization surveillance program. It is not intended to diagnose MRSA infection nor to guide or monitor treatment for MRSA infections. Performed at [facility], [address] [place], [place] [address]          Radiology Studies: DG Chest 1 View  Result Date: 08/22/2019 CLINICAL DATA:  Sepsis EXAM: CHEST  1 VIEW COMPARISON:  08/01/2019 FINDINGS: The heart size and mediastinal contours  are within normal limits. There is some atelectasis at the left lung base. The visualized skeletal structures are unremarkable. IMPRESSION: No active disease. Electronically Signed   By: Constance Holster M.D.   On: 08/22/2019 00:54   CT Head Wo Contrast  Result Date: 08/21/2019 CLINICAL DATA:  69 year old female with altered mental status, recently diagnosed with UTI. EXAM: CT HEAD WITHOUT CONTRAST TECHNIQUE: Contiguous axial images were obtained from the base of the skull through the vertex without intravenous contrast. COMPARISON:  Cervical spine MRI earlier today. FINDINGS: Brain: Chronic appearing encephalomalacia in the left frontal lobe, left frontal operculum and insula. Mild ex vacuo enlargement of the left lateral ventricle. Superimposed left ICA terminus region embolization coil pack. No midline shift, ventriculomegaly, mass effect, evidence of mass lesion, intracranial hemorrhage or evidence of cortically based acute infarction. No additional cortical encephalomalacia identified. Vascular: Embolization coil pack at the level of the left ICA terminus, with associated streak artifact. Skull: No acute osseous abnormality identified. Hyperostosis, normal variant. Sinuses/Orbits: Visualized paranasal sinuses and mastoids are well pneumatized. Other: Visualized orbits and  scalp soft tissues are within normal limits. IMPRESSION: 1. No acute intracranial abnormality identified. 2. Chronic encephalomalacia in the left MCA territory, with sequelae of prior aneurysm coil embolization at the left ICA terminus. Electronically Signed   By: Genevie Ann M.D.   On: 08/21/2019 23:50   MR BRAIN WO CONTRAST  Result Date: 08/23/2019 CLINICAL DATA:  Encephalopathy. EXAM: MRI HEAD WITHOUT CONTRAST TECHNIQUE: Multiplanar, multiecho pulse sequences of the brain and surrounding structures were obtained without intravenous contrast. COMPARISON:  Head CT 08/21/2019 FINDINGS: The study is severely motion degraded despite the use of faster, more motion resistant imaging protocols. Sagittal T1 and coronal T2 sequences were not obtained. Brain: No acute infarct, extra-axial fluid collection, or significant intracranial mass effect is identified. There is a moderate-sized chronic left MCA infarct involving the left frontal greater than parietal lobes and insula with associated chronic blood products and ex vacuo dilatation of the left lateral ventricle. Vascular: Susceptibility artifact from aneurysm coils at the left ICA terminus. Major intracranial vascular flow voids are grossly preserved. Skull and upper cervical spine: No destructive skull lesion. Sinuses/Orbits: Unremarkable orbits. Mild mucosal thickening in the paranasal sinuses. Clear mastoid air cells. Other: None. IMPRESSION: 1. Severely motion degraded, incomplete examination. 2. No acute infarct. 3. Chronic left MCA infarct. Electronically Signed   By: Logan Bores M.D.   On: 08/23/2019 13:42   MR CERVICAL SPINE W WO CONTRAST  Result Date: 08/21/2019 CLINICAL DATA:  69 year old female with degenerative disease. Difficulty walking for 4 months with bilateral leg pain. Sarcoidosis. EXAM: MRI CERVICAL SPINE WITHOUT AND WITH CONTRAST TECHNIQUE: Multiplanar and multiecho pulse sequences of the cervical spine, to include the craniocervical junction  and cervicothoracic junction, were obtained without and with intravenous contrast. CONTRAST:  50mL GADAVIST GADOBUTROL 1 MMOL/ML IV SOLN COMPARISON:  Lumbar MRI 08/06/2019. FINDINGS: Alignment: Straightening and mild reversal of cervical lordosis. No spondylolisthesis. Vertebrae: No marrow edema or evidence of acute osseous abnormality. Visualized bone marrow signal is within normal limits. Chronic degenerative endplate marrow signal changes at some cervical levels. Cord: Marland Kitchen Mildly motion degraded axial The axial T2 imaging T2 * and sagittal imaging is of better quality. Spinal cord signal is within normal limits at all visualized levels. Capacious spinal canal at most levels. No abnormal intradural enhancement. No dural thickening. Posterior Fossa, vertebral arteries, paraspinal tissues: Cervicomedullary junction is within normal limits. Negative visible posterior fossa. Preserved major vascular flow voids  in the neck. Negative visible neck soft tissues; diminutive or absent thyroid. Negative visible lung apices. Disc levels: C2-C3: Mild facet hypertrophy. Borderline to mild bilateral C3 foraminal stenosis. C3-C4: Disc space loss with circumferential disc bulge and endplate spurring. Mild facet hypertrophy. Effaced ventral CSF space but no spinal stenosis. Mild to moderate left C4 foraminal stenosis. C4-C5: Disc space loss with circumferential disc bulge and endplate spurring. Mostly foraminal involvement. No spinal stenosis. Moderate to severe left and mild-to-moderate right C5 foraminal stenosis. C5-C6: Disc space loss with circumferential disc bulge and endplate spurring. Broad-based posterior component of disc effaces the ventral CSF space but without significant spinal stenosis. Moderate bilateral C6 foraminal stenosis, greater on the left. C6-C7: Disc space loss with circumferential disc bulge and endplate spurring. No spinal stenosis. Moderate to severe left C7 foraminal stenosis. Mild right foraminal  stenosis. C7-T1: Mild disc bulge. Mild facet hypertrophy. Borderline to mild left C8 foraminal stenosis. No upper thoracic spinal stenosis. IMPRESSION: 1. No acute or inflammatory process in the cervical spine. 2. Widespread cervical disc and endplate degeneration, but fairly capacious underlying spinal canal with no spinal stenosis. 3. Up to moderate and occasionally severe neural foraminal stenosis: Left C4, left C5, bilateral C6, and left C7 nerve levels. Electronically Signed   By: Odessa Fleming M.D.   On: 08/21/2019 18:15        Scheduled Meds: . atorvastatin  40 mg Oral Daily  . FLUoxetine  10 mg Oral Daily  . gabapentin  200 mg Oral TID  . levothyroxine  75 mcg Oral Q0600  . multivitamin with minerals  1 tablet Oral Daily  . pantoprazole  40 mg Oral Daily  . predniSONE  5 mg Oral Q breakfast  . tiZANidine  2 mg Oral TID   Continuous Infusions: . sodium chloride Stopped (08/23/19 0926)  . ceFEPime (MAXIPIME) IV 200 mL/hr at 08/23/19 0951  . vancomycin 1,250 mg (08/23/19 0045)     LOS: 1 day    Time spent: 35 minutes    Tresa Moore, MD [facility] Pager [phone]  If 7PM-7AM, please contact night-coverage 08/23/2019, 3:44 PM

## 2019-08-23 NOTE — Consult Note (Signed)
Reason for Consult: confusion  Requesting Physician: Dr. Georgeann Oppenheim   CC: AMS  HPI: Morgan Carpenter is an 69 y.o. female African-American female with a known history of sarcoidosis, obstructive sleep apnea, hypopituitarism, dyslipidemia and CVA, who presented to the emergency room with acute onset of altered mental status with incoherence and confusion lately.  The patient was diagnosed with UTI yesterday and was up apparently placed on outpatient oral antibiotic at [facility] where she resides.  The patient admitted to urinary urgency without frequency or dysuria or hematuria or flank pain.  She denied any cough or wheezing or dyspnea or chest pain.  She is a fairly poor historian due to her altered mental status though. Pt had LA elevated at 4.6 which has improved overnight.  Slightly more alert today but does not sound like back to baseline.   Pt placed on broad spectrum antibiotics but currently not following commands.    Past Medical History:  Diagnosis Date  . Adrenal insufficiency (HCC)   . DDD (degenerative disc disease), cervical   . DDD (degenerative disc disease), lumbar   . Hemiplegia (HCC)   . Hypercholesteremia   . Hyperprolactinemia (HCC)   . Hypopituitarism (HCC)   . Lymphocytic hypophysitis (HCC)   . OSA (obstructive sleep apnea)   . Patient is Jehovah's Witness   . Recurrent deep vein thrombosis (DVT) (HCC)   . Sarcoidosis   . Stroke (HCC)   . Thyroid disease   . Vitamin D deficiency     Past Surgical History:  Procedure Laterality Date  . CHOLECYSTECTOMY    . NOSE SURGERY      No family history on file.  Social History:  reports that she has never smoked. She has never used smokeless tobacco. She reports that she does not drink alcohol or use drugs.  Allergies  Allergen Reactions  . Naprosyn [Naproxen] Hives  . Shellfish Allergy Hives  . Sulfa Antibiotics Hives    Medications: I have reviewed the patient's current medications.  ROS: Unable to  obtain due to obtundation   Physical Examination: Blood pressure 128/64, pulse (!) 110, temperature 99.6 F (37.6 C), temperature source Oral, resp. rate 16, height 5\' 4"  (1.626 m), weight 84 kg, SpO2 100 %.   Neurological Examination   Mental Status: Opens her eyes, confused. Able to tell me name otherwise dysarthria.   Cranial Nerves: II: Discs flat bilaterally; Visual fields grossly normal, pupils equal, round, reactive to light and accommodation V,VII: smile symmetric, facial light touch sensation normal bilaterally VIII: hearing normal bilaterally Motor: Generalized weakness bilaterally with increased tone through out Sensory: Pinprick and light touch intact throughout, bilaterally Deep Tendon Reflexes: 1+ and symmetric throughout Plantars: Right: downgoing   Left: downgoing  Laboratory Studies:   Basic Metabolic Panel: Recent Labs  Lab 08/21/19 2317 08/22/19 0355 08/23/19 0529  NA 141 141 144  K 3.9 4.0 3.8  CL 100 109 111  CO2 27 24 22   GLUCOSE 95 104* 86  BUN 13 13 17   CREATININE 2.14* 1.64* 1.31*  CALCIUM 9.1 7.8* 8.3*    Liver Function Tests: Recent Labs  Lab 08/21/19 2317 08/22/19 0355  AST 141* 119*  ALT 43 37  ALKPHOS 93 75  BILITOT 1.0 1.0  PROT 6.5 5.4*  ALBUMIN 2.8* 2.3*   No results for input(s): LIPASE, AMYLASE in the last 168 hours. No results for input(s): AMMONIA in the last 168 hours.  CBC: Recent Labs  Lab 08/21/19 2317 08/22/19 0355 08/23/19 08/23/19  WBC 11.8* 8.7 9.7  NEUTROABS 6.3 4.9 5.0  HGB 12.9 12.3 10.4*  HCT 39.1 37.4 32.5*  MCV 84.8 83.9 87.1  PLT 786* 521* 622*    Cardiac Enzymes: No results for input(s): CKTOTAL, CKMB, CKMBINDEX, TROPONINI in the last 168 hours.  BNP: Invalid input(s): POCBNP  CBG: No results for input(s): GLUCAP in the last 168 hours.  Microbiology: Results for orders placed or performed during the hospital encounter of 08/21/19  Blood culture (routine x 2)     Status: None (Preliminary  result)   Collection Time: 08/21/19 11:24 PM   Specimen: Right Antecubital; Blood  Result Value Ref Range Status   Specimen Description RIGHT ANTECUBITAL  Final   Special Requests   Final    BOTTLES DRAWN AEROBIC AND ANAEROBIC Blood Culture results may not be optimal due to an excessive volume of blood received in culture bottles   Culture   Final    NO GROWTH 1 DAY Performed at [facility], [address], [place], [place] [address]    Report Status PENDING  Incomplete  Blood culture (routine x 2)     Status: None (Preliminary result)   Collection Time: 08/21/19 11:29 PM   Specimen: BLOOD RIGHT WRIST  Result Value Ref Range Status   Specimen Description BLOOD RIGHT WRIST  Final   Special Requests   Final    BOTTLES DRAWN AEROBIC AND ANAEROBIC Blood Culture results may not be optimal due to an excessive volume of blood received in culture bottles   Culture   Final    NO GROWTH 1 DAY Performed at [facility], [address] [place], [place] [address]    Report Status PENDING  Incomplete  Urine Culture     Status: Abnormal (Preliminary result)   Collection Time: 08/22/19 12:46 AM   Specimen: Urine, Catheterized  Result Value Ref Range Status   Specimen Description   Final    URINE, CATHETERIZED Performed at [facility], [address] [place], [place] [address]    Special Requests   Final    NONE Performed at [facility], [address], [place], [place] [address]    Culture 40,000 COLONIES/mL ENTEROCOCCUS FAECIUM (A)  Final   Report Status PENDING  Incomplete  SARS Coronavirus 2 by RT PCR (hospital order, performed in [facility] hospital lab) Nasopharyngeal Nasopharyngeal Swab     Status: None   Collection Time: 08/22/19  1:09 AM   Specimen: Nasopharyngeal Swab  Result Value Ref Range Status   SARS Coronavirus 2 NEGATIVE NEGATIVE Final    Comment: (NOTE) SARS-CoV-2 target nucleic acids are NOT DETECTED. The SARS-CoV-2 RNA is  generally detectable in upper and lower respiratory specimens during the acute phase of infection. The lowest concentration of SARS-CoV-2 viral copies this assay can detect is 250 copies / mL. A negative result does not preclude SARS-CoV-2 infection and should not be used as the sole basis for treatment or other patient management decisions.  A negative result may occur with improper specimen collection / handling, submission of specimen other than nasopharyngeal swab, presence of viral mutation(s) within the areas targeted by this assay, and inadequate number of viral copies (<250 copies / mL). A negative result must be combined with clinical observations, patient history, and epidemiological information. Fact Sheet for Patients:   BoilerBrush.com.cy Fact Sheet for Healthcare Providers: https://pope.com/ This test is not yet approved or cleared  by the [place] FDA and has been authorized for detection and/or diagnosis  of SARS-CoV-2 by FDA under an Emergency Use Authorization (EUA).  This EUA will remain in effect (meaning this test can be used) for the duration of the COVID-19 declaration under Section 564(b)(1) of the Act, 21 U.S.C. section 360bbb-3(b)(1), unless the authorization is terminated or revoked sooner. Performed at [facility], [address] [place], [place] [address]   MRSA PCR Screening     Status: None   Collection Time: 08/23/19  2:45 AM   Specimen: Nasopharyngeal  Result Value Ref Range Status   MRSA by PCR NEGATIVE NEGATIVE Final    Comment:        The GeneXpert MRSA Assay (FDA approved for NASAL specimens only), is one component of a comprehensive MRSA colonization surveillance program. It is not intended to diagnose MRSA infection nor to guide or monitor treatment for MRSA infections. Performed at [facility], [address] [place], [place] [address]     Coagulation  Studies: Recent Labs    08/22/19 0355 08/23/19 0529  LABPROT 58.4* 41.7*  INR 7.0* 4.5*    Urinalysis:  Recent Labs  Lab 08/22/19 0046  COLORURINE AMBER*  LABSPEC 1.030  PHURINE 5.0  GLUCOSEU NEGATIVE  HGBUR LARGE*  BILIRUBINUR NEGATIVE  KETONESUR NEGATIVE  PROTEINUR 100*  NITRITE NEGATIVE  LEUKOCYTESUR SMALL*    Lipid Panel:  No results found for: CHOL, TRIG, HDL, CHOLHDL, VLDL, LDLCALC  HgbA1C: No results found for: HGBA1C  Urine Drug Screen:  No results found for: LABOPIA, COCAINSCRNUR, LABBENZ, AMPHETMU, THCU, LABBARB  Alcohol Level: No results for input(s): ETH in the last 168 hours.  Other results: EKG: normal EKG, normal sinus rhythm, unchanged from previous tracings.  Imaging: DG Chest 1 View  Result Date: 08/22/2019 CLINICAL DATA:  Sepsis EXAM: CHEST  1 VIEW COMPARISON:  08/01/2019 FINDINGS: The heart size and mediastinal contours are within normal limits. There is some atelectasis at the left lung base. The visualized skeletal structures are unremarkable. IMPRESSION: No active disease. Electronically Signed   By: Constance Holster M.D.   On: 08/22/2019 00:54   CT Head Wo Contrast  Result Date: 08/21/2019 CLINICAL DATA:  69 year old female with altered mental status, recently diagnosed with UTI. EXAM: CT HEAD WITHOUT CONTRAST TECHNIQUE: Contiguous axial images were obtained from the base of the skull through the vertex without intravenous contrast. COMPARISON:  Cervical spine MRI earlier today. FINDINGS: Brain: Chronic appearing encephalomalacia in the left frontal lobe, left frontal operculum and insula. Mild ex vacuo enlargement of the left lateral ventricle. Superimposed left ICA terminus region embolization coil pack. No midline shift, ventriculomegaly, mass effect, evidence of mass lesion, intracranial hemorrhage or evidence of cortically based acute infarction. No additional cortical encephalomalacia identified. Vascular: Embolization coil pack at the level  of the left ICA terminus, with associated streak artifact. Skull: No acute osseous abnormality identified. Hyperostosis, normal variant. Sinuses/Orbits: Visualized paranasal sinuses and mastoids are well pneumatized. Other: Visualized orbits and scalp soft tissues are within normal limits. IMPRESSION: 1. No acute intracranial abnormality identified. 2. Chronic encephalomalacia in the left MCA territory, with sequelae of prior aneurysm coil embolization at the left ICA terminus. Electronically Signed   By: Genevie Ann M.D.   On: 08/21/2019 23:50   MR CERVICAL SPINE W WO CONTRAST  Result Date: 08/21/2019 CLINICAL DATA:  69 year old female with degenerative disease. Difficulty walking for 4 months with bilateral leg pain. Sarcoidosis. EXAM: MRI CERVICAL SPINE WITHOUT AND WITH CONTRAST TECHNIQUE: Multiplanar and multiecho pulse sequences of the cervical spine, to include the craniocervical  junction and cervicothoracic junction, were obtained without and with intravenous contrast. CONTRAST:  21mL GADAVIST GADOBUTROL 1 MMOL/ML IV SOLN COMPARISON:  Lumbar MRI 08/06/2019. FINDINGS: Alignment: Straightening and mild reversal of cervical lordosis. No spondylolisthesis. Vertebrae: No marrow edema or evidence of acute osseous abnormality. Visualized bone marrow signal is within normal limits. Chronic degenerative endplate marrow signal changes at some cervical levels. Cord: Marland Kitchen Mildly motion degraded axial The axial T2 imaging T2 * and sagittal imaging is of better quality. Spinal cord signal is within normal limits at all visualized levels. Capacious spinal canal at most levels. No abnormal intradural enhancement. No dural thickening. Posterior Fossa, vertebral arteries, paraspinal tissues: Cervicomedullary junction is within normal limits. Negative visible posterior fossa. Preserved major vascular flow voids in the neck. Negative visible neck soft tissues; diminutive or absent thyroid. Negative visible lung apices. Disc levels:  C2-C3: Mild facet hypertrophy. Borderline to mild bilateral C3 foraminal stenosis. C3-C4: Disc space loss with circumferential disc bulge and endplate spurring. Mild facet hypertrophy. Effaced ventral CSF space but no spinal stenosis. Mild to moderate left C4 foraminal stenosis. C4-C5: Disc space loss with circumferential disc bulge and endplate spurring. Mostly foraminal involvement. No spinal stenosis. Moderate to severe left and mild-to-moderate right C5 foraminal stenosis. C5-C6: Disc space loss with circumferential disc bulge and endplate spurring. Broad-based posterior component of disc effaces the ventral CSF space but without significant spinal stenosis. Moderate bilateral C6 foraminal stenosis, greater on the left. C6-C7: Disc space loss with circumferential disc bulge and endplate spurring. No spinal stenosis. Moderate to severe left C7 foraminal stenosis. Mild right foraminal stenosis. C7-T1: Mild disc bulge. Mild facet hypertrophy. Borderline to mild left C8 foraminal stenosis. No upper thoracic spinal stenosis. IMPRESSION: 1. No acute or inflammatory process in the cervical spine. 2. Widespread cervical disc and endplate degeneration, but fairly capacious underlying spinal canal with no spinal stenosis. 3. Up to moderate and occasionally severe neural foraminal stenosis: Left C4, left C5, bilateral C6, and left C7 nerve levels. Electronically Signed   By: Odessa Fleming M.D.   On: 08/21/2019 18:15     Assessment/Plan:  69 y.o. female African-American female with a known history of sarcoidosis, obstructive sleep apnea, hypopituitarism, dyslipidemia and CVA, who presented to the emergency room with acute onset of altered mental status with incoherence and confusion lately.  The patient was diagnosed with UTI yesterday and was up apparently placed on outpatient oral antibiotic at [facility] where she resides.  The patient admitted to urinary urgency without frequency or dysuria or hematuria or flank  pain.  She denied any cough or wheezing or dyspnea or chest pain.  She is a fairly poor historian due to her altered mental status though. Pt had LA elevated at 4.6 which has improved overnight.  Slightly more alert today but does not sound like back to baseline.   Pt placed on broad spectrum antibiotics but currently not following commands.    - Tough to judge if this is progressive deterioration or worsening mentation in settting of infection  - She is on a lot of medications so polypharmacy not out of question but unable to make that determination in the acute phase - Infection as per primary team - out patient Neurology follow up for mini mental and cognitive testing 08/23/2019, 1:36 PM

## 2019-08-23 NOTE — Progress Notes (Signed)
Held zanaflex due to disorientation - ok per MD

## 2019-08-23 NOTE — Consult Note (Signed)
Pharmacy Antibiotic Note  Morgan Carpenter is a 69 y.o. female admitted on 08/21/2019 with sepsis.  Pharmacy has been consulted for Vancomycin/Cefepime dosing. Possible source UTI?  Plan: Will continue Cefepime 2g q12h and monitor renal function closely  Pt received a total load of 1500mg  Vancomycin in ED - started Vancomycin 1250mg  q24 per dosing nomogram and monitor renal function closely for possible adjustment. If source is a UTI, recommend d/c vancomycin, no hx of MRSA infection noted. Fortunately, Scr is trending down.    Scr 2.14 > 1.64 > .1.31  Height: 5\' 4"  (162.6 cm) Weight: 84 kg (185 lb 3 oz) IBW/kg (Calculated) : 54.7  Temp (24hrs), Avg:99 F (37.2 C), Min:98.8 F (37.1 C), Max:99.1 F (37.3 C)  Recent Labs  Lab 08/21/19 2317 08/22/19 0109 08/22/19 0355 08/22/19 0640 08/23/19 0529  WBC 11.8*  --  8.7  --  9.7  CREATININE 2.14*  --  1.64*  --  1.31*  LATICACIDVEN 4.6* 3.2* 2.3* 1.9  --     Estimated Creatinine Clearance: 43.7 mL/min (A) (by C-G formula based on SCr of 1.31 mg/dL (H)).    Allergies  Allergen Reactions  . Naprosyn [Naproxen] Hives  . Shellfish Allergy Hives  . Sulfa Antibiotics Hives    Antimicrobials this admission: Cefepime 5/20 >> Vancomycin 5/20 >> Metronidazole 5/20 x 1  Dose adjustments this admission: N/A  Microbiology results: 5/19 BCx: pending 5/19 UCx: pending  COVID NEG MRSA PCR: negative  Thank you for allowing pharmacy to be a part of this patient's care.  6/20, PharmD, BCPS Clinical Pharmacist 08/23/2019 7:56 AM

## 2019-08-24 LAB — BASIC METABOLIC PANEL
Anion gap: 10 (ref 5–15)
BUN: 14 mg/dL (ref 8–23)
CO2: 22 mmol/L (ref 22–32)
Calcium: 8.5 mg/dL — ABNORMAL LOW (ref 8.9–10.3)
Chloride: 116 mmol/L — ABNORMAL HIGH (ref 98–111)
Creatinine, Ser: 0.99 mg/dL (ref 0.44–1.00)
GFR calc Af Amer: >60 mL/min (ref >60)
GFR calc non Af Amer: 59 mL/min — ABNORMAL LOW (ref >60)
Glucose, Bld: 95 mg/dL (ref 70–99)
Potassium: 3.4 mmol/L — ABNORMAL LOW (ref 3.5–5.1)
Sodium: 148 mmol/L — ABNORMAL HIGH (ref 135–145)

## 2019-08-24 LAB — CBC WITH DIFFERENTIAL/PLATELET
Abs Immature Granulocytes: 0.03 10*3/uL (ref 0.00–0.07)
Basophils Absolute: 0.1 10*3/uL (ref 0.0–0.1)
Basophils Relative: 1 %
Eosinophils Absolute: 0.3 10*3/uL (ref 0.0–0.5)
Eosinophils Relative: 3 %
HCT: 30.5 % — ABNORMAL LOW (ref 36.0–46.0)
Hemoglobin: 9.6 g/dL — ABNORMAL LOW (ref 12.0–15.0)
Immature Granulocytes: 0 %
Lymphocytes Relative: 22 %
Lymphs Abs: 2.2 10*3/uL (ref 0.7–4.0)
MCH: 27.5 pg (ref 26.0–34.0)
MCHC: 31.5 g/dL (ref 30.0–36.0)
MCV: 87.4 fL (ref 80.0–100.0)
Monocytes Absolute: 1.1 10*3/uL — ABNORMAL HIGH (ref 0.1–1.0)
Monocytes Relative: 10 %
Neutro Abs: 6.5 10*3/uL (ref 1.7–7.7)
Neutrophils Relative %: 64 %
Platelets: 511 10*3/uL — ABNORMAL HIGH (ref 150–400)
RBC: 3.49 MIL/uL — ABNORMAL LOW (ref 3.87–5.11)
RDW: 17.2 % — ABNORMAL HIGH (ref 11.5–15.5)
WBC: 10.3 10*3/uL (ref 4.0–10.5)
nRBC: 0 % (ref 0.0–0.2)

## 2019-08-24 LAB — PROTIME-INR
INR: 2.4 — ABNORMAL HIGH (ref 0.8–1.2)
Prothrombin Time: 25.2 seconds — ABNORMAL HIGH (ref 11.4–15.2)

## 2019-08-24 LAB — URINE CULTURE: Culture: 40000 — AB

## 2019-08-24 LAB — AMMONIA: Ammonia: 11 umol/L (ref 9–35)

## 2019-08-24 LAB — PROCALCITONIN: Procalcitonin: 8.04 ng/mL

## 2019-08-24 LAB — TSH: TSH: <0.01 u[IU]/mL — ABNORMAL LOW (ref 0.350–4.500)

## 2019-08-24 MED ORDER — METRONIDAZOLE IN NACL 5-0.79 MG/ML-% IV SOLN
500.0000 mg | Freq: Three times a day (TID) | INTRAVENOUS | Status: DC
  Administered 2019-08-24 – 2019-08-26 (×7): 500 mg via INTRAVENOUS
  Filled 2019-08-24 (×9): qty 100

## 2019-08-24 MED ORDER — SODIUM CHLORIDE 0.9 % IV SOLN
2.0000 g | INTRAVENOUS | Status: DC
  Administered 2019-08-24 – 2019-08-27 (×4): 2 g via INTRAVENOUS
  Filled 2019-08-24 (×2): qty 2
  Filled 2019-08-24 (×2): qty 20
  Filled 2019-08-24: qty 2

## 2019-08-24 MED ORDER — TIZANIDINE HCL 2 MG PO TABS
2.0000 mg | ORAL_TABLET | Freq: Once | ORAL | Status: AC
  Administered 2019-08-24: 2 mg via ORAL
  Filled 2019-08-24: qty 1

## 2019-08-24 MED ORDER — VANCOMYCIN HCL 750 MG/150ML IV SOLN
750.0000 mg | Freq: Two times a day (BID) | INTRAVENOUS | Status: DC
  Administered 2019-08-24 – 2019-08-27 (×6): 750 mg via INTRAVENOUS
  Filled 2019-08-24 (×7): qty 150

## 2019-08-24 MED ORDER — RIVAROXABAN 20 MG PO TABS
20.0000 mg | ORAL_TABLET | Freq: Every day | ORAL | Status: DC
  Administered 2019-08-24 – 2019-08-26 (×3): 20 mg via ORAL
  Filled 2019-08-24 (×3): qty 1

## 2019-08-24 NOTE — Consult Note (Signed)
Pharmacy Antibiotic Note  Morgan Carpenter is a 69 y.o. female admitted on 08/21/2019 with sepsis.  Pharmacy has been consulted for Vancomycin/Cefepime dosing. Possible source UTI?  Plan: Will continue Cefepime 2g q12h  Change vancomycin to 750 mg IV q12h for improvement in renal function. Continue to monitor renal function and plan for antibiotic therapy.   Scr 2.14 > 1.64 > 1.31> 0.99  Height: 5\' 4"  (162.6 cm) Weight: 84 kg (185 lb 3 oz) IBW/kg (Calculated) : 54.7  Temp (24hrs), Avg:99.5 F (37.5 C), Min:98.2 F (36.8 C), Max:100.9 F (38.3 C)  Recent Labs  Lab 08/21/19 2317 08/22/19 0109 08/22/19 0355 08/22/19 0640 08/23/19 0529 08/24/19 0551  WBC 11.8*  --  8.7  --  9.7 10.3  CREATININE 2.14*  --  1.64*  --  1.31* 0.99  LATICACIDVEN 4.6* 3.2* 2.3* 1.9  --   --     Estimated Creatinine Clearance: 57.8 mL/min (by C-G formula based on SCr of 0.99 mg/dL).    Allergies  Allergen Reactions  . Naprosyn [Naproxen] Hives  . Shellfish Allergy Hives  . Sulfa Antibiotics Hives    Antimicrobials this admission: Cefepime 5/20 >> Vancomycin 5/20 >> Metronidazole 5/20 x 1  Dose adjustments this admission: N/A  Microbiology results: 5/19 BCx: NGTD 5/19 UCx: 40,000 E.faecium (vanc sensitive)  COVID NEG MRSA PCR: negative  Thank you for allowing pharmacy to be a part of this patient's care.  6/19, PharmD Clinical Pharmacist 08/24/2019 11:40 AM

## 2019-08-24 NOTE — Progress Notes (Addendum)
Subjective: Patient remains altered.  Awake but lying curled up in bed.  Speech unintelligible for the most part.    Objective: Current vital signs: BP 112/84 (BP Location: Left Arm)   Pulse (!) 115   Temp 99.2 F (37.3 C) (Oral)   Resp 17   Ht 5\' 4"  (1.626 m)   Wt 84 kg   SpO2 98%   BMI 31.79 kg/m  Vital signs in last 24 hours: Temp:  [98.2 F (36.8 C)-100.9 F (38.3 C)] 99.2 F (37.3 C) (05/22 1156) Pulse Rate:  [105-115] 115 (05/22 1156) Resp:  [17-20] 17 (05/22 1156) BP: (112-137)/(71-101) 112/84 (05/22 1156) SpO2:  [98 %-100 %] 98 % (05/22 1156)  Intake/Output from previous day: 05/21 0701 - 05/22 0700 In: 913.4 [P.O.:240; I.V.:613.8; IV Piggyback:59.6] Out: 1400 [Urine:1400] Intake/Output this shift: No intake/output data recorded. Nutritional status:  Diet Order            Diet Heart Room service appropriate? Yes; Fluid consistency: Thin  Diet effective now              Neurologic Exam: Mental Status: Awake.  Turns to examiner when name called.  Some one word responses clear but for the most part speech is unintelligible.  Does not follow commands.  . Cranial Nerves: II: Pupils equal, round, reactive to light and accommodation Patient would not cooperate for remainder of cranial nerve testing Motor: Moves left upper extremity more than RUE.  Will not allow me to move her legs Sensory: Appears to appreciate touch throughout  Lab Results: Basic Metabolic Panel: Recent Labs  Lab 08/21/19 2317 08/21/19 2317 08/22/19 0355 08/23/19 0529 08/24/19 0551  NA 141  --  141 144 148*  K 3.9  --  4.0 3.8 3.4*  CL 100  --  109 111 116*  CO2 27  --  24 22 22   GLUCOSE 95  --  104* 86 95  BUN 13  --  13 17 14   CREATININE 2.14*  --  1.64* 1.31* 0.99  CALCIUM 9.1   < > 7.8* 8.3* 8.5*   < > = values in this interval not displayed.    Liver Function Tests: Recent Labs  Lab 08/21/19 2317 08/22/19 0355  AST 141* 119*  ALT 43 37  ALKPHOS 93 75  BILITOT 1.0  1.0  PROT 6.5 5.4*  ALBUMIN 2.8* 2.3*   No results for input(s): LIPASE, AMYLASE in the last 168 hours. No results for input(s): AMMONIA in the last 168 hours.  CBC: Recent Labs  Lab 08/21/19 2317 08/22/19 0355 08/23/19 0529 08/24/19 0551  WBC 11.8* 8.7 9.7 10.3  NEUTROABS 6.3 4.9 5.0 6.5  HGB 12.9 12.3 10.4* 9.6*  HCT 39.1 37.4 32.5* 30.5*  MCV 84.8 83.9 87.1 87.4  PLT 786* 521* 622* 511*    Cardiac Enzymes: No results for input(s): CKTOTAL, CKMB, CKMBINDEX, TROPONINI in the last 168 hours.  Lipid Panel: No results for input(s): CHOL, TRIG, HDL, CHOLHDL, VLDL, LDLCALC in the last 168 hours.  CBG: No results for input(s): GLUCAP in the last 168 hours.  Microbiology: Results for orders placed or performed during the hospital encounter of 08/21/19  Blood culture (routine x 2)     Status: None (Preliminary result)   Collection Time: 08/21/19 11:24 PM   Specimen: Right Antecubital; Blood  Result Value Ref Range Status   Specimen Description RIGHT ANTECUBITAL  Final   Special Requests   Final    BOTTLES DRAWN AEROBIC AND ANAEROBIC Blood Culture  results may not be optimal due to an excessive volume of blood received in culture bottles   Culture   Final    NO GROWTH 2 DAYS Performed at [facility], [address] [place], [place] [address]    Report Status PENDING  Incomplete  Blood culture (routine x 2)     Status: None (Preliminary result)   Collection Time: 08/21/19 11:29 PM   Specimen: BLOOD RIGHT WRIST  Result Value Ref Range Status   Specimen Description BLOOD RIGHT WRIST  Final   Special Requests   Final    BOTTLES DRAWN AEROBIC AND ANAEROBIC Blood Culture results may not be optimal due to an excessive volume of blood received in culture bottles   Culture   Final    NO GROWTH 2 DAYS Performed at [facility], [address] [place], [place] [address]    Report Status PENDING  Incomplete  Urine Culture     Status: Abnormal   Collection  Time: 08/22/19 12:46 AM   Specimen: Urine, Catheterized  Result Value Ref Range Status   Specimen Description   Final    URINE, CATHETERIZED Performed at [facility], [address] [place], [place] [address]    Special Requests   Final    NONE Performed at [facility], [address] [place], [place] [address]    Culture 40,000 COLONIES/mL ENTEROCOCCUS FAECIUM (A)  Final   Report Status 08/24/2019 FINAL  Final   Organism ID, Bacteria ENTEROCOCCUS FAECIUM (A)  Final      Susceptibility   Enterococcus faecium - MIC*    AMPICILLIN >=32 RESISTANT Resistant     NITROFURANTOIN 128 RESISTANT Resistant     VANCOMYCIN <=0.5 SENSITIVE Sensitive     * 40,000 COLONIES/mL ENTEROCOCCUS FAECIUM  SARS Coronavirus 2 by RT PCR (hospital order, performed in [facility] hospital lab) Nasopharyngeal Nasopharyngeal Swab     Status: None   Collection Time: 08/22/19  1:09 AM   Specimen: Nasopharyngeal Swab  Result Value Ref Range Status   SARS Coronavirus 2 NEGATIVE NEGATIVE Final    Comment: (NOTE) SARS-CoV-2 target nucleic acids are NOT DETECTED. The SARS-CoV-2 RNA is generally detectable in upper and lower respiratory specimens during the acute phase of infection. The lowest concentration of SARS-CoV-2 viral copies this assay can detect is 250 copies / mL. A negative result does not preclude SARS-CoV-2 infection and should not be used as the sole basis for treatment or other patient management decisions.  A negative result may occur with improper specimen collection / handling, submission of specimen other than nasopharyngeal swab, presence of viral mutation(s) within the areas targeted by this assay, and inadequate number of viral copies (<250 copies / mL). A negative result must be combined with clinical observations, patient history, and epidemiological information. Fact Sheet for Patients:   BoilerBrush.com.cy Fact Sheet for Healthcare  Providers: https://pope.com/ This test is not yet approved or cleared  by the [place] FDA and has been authorized for detection and/or diagnosis of SARS-CoV-2 by FDA under an Emergency Use Authorization (EUA).  This EUA will remain in effect (meaning this test can be used) for the duration of the COVID-19 declaration under Section 564(b)(1) of the Act, 21 U.S.C. section 360bbb-3(b)(1), unless the authorization is terminated or revoked sooner. Performed at [facility], [address] [place], [place] [address]   MRSA PCR Screening     Status: None   Collection Time: 08/23/19  2:45 AM   Specimen: Nasopharyngeal  Result Value Ref Range Status   MRSA by PCR NEGATIVE NEGATIVE Final    Comment:        The GeneXpert MRSA Assay (FDA approved for NASAL specimens only), is one component of a comprehensive MRSA colonization surveillance program. It is not intended to diagnose MRSA infection nor to guide or monitor treatment for MRSA infections. Performed at [facility], [address] [place], [place] [address]     Coagulation Studies: Recent Labs    08/22/19 0355 08/23/19 0529 08/24/19 0551  LABPROT 58.4* 41.7* 25.2*  INR 7.0* 4.5* 2.4*    Imaging: MR BRAIN WO CONTRAST  Result Date: 08/23/2019 CLINICAL DATA:  Encephalopathy. EXAM: MRI HEAD WITHOUT CONTRAST TECHNIQUE: Multiplanar, multiecho pulse sequences of the brain and surrounding structures were obtained without intravenous contrast. COMPARISON:  Head CT 08/21/2019 FINDINGS: The study is severely motion degraded despite the use of faster, more motion resistant imaging protocols. Sagittal T1 and coronal T2 sequences were not obtained. Brain: No acute infarct, extra-axial fluid collection, or significant intracranial mass effect is identified. There is a moderate-sized chronic left MCA infarct involving the left frontal greater than parietal lobes and insula with associated  chronic blood products and ex vacuo dilatation of the left lateral ventricle. Vascular: Susceptibility artifact from aneurysm coils at the left ICA terminus. Major intracranial vascular flow voids are grossly preserved. Skull and upper cervical spine: No destructive skull lesion. Sinuses/Orbits: Unremarkable orbits. Mild mucosal thickening in the paranasal sinuses. Clear mastoid air cells. Other: None. IMPRESSION: 1. Severely motion degraded, incomplete examination. 2. No acute infarct. 3. Chronic left MCA infarct. Electronically Signed   By: Sebastian Ache M.D.   On: 08/23/2019 13:42    Medications:  I have reviewed the patient's current medications. Scheduled: . atorvastatin  40 mg Oral Daily  . FLUoxetine  10 mg Oral Daily  . gabapentin  200 mg Oral TID  . levothyroxine  75 mcg Oral Q0600  . multivitamin with minerals  1 tablet Oral Daily  . pantoprazole  40 mg Oral Daily  . predniSONE  5 mg Oral Q breakfast  . rivaroxaban  20 mg Oral Daily    Assessment/Plan: 69 y.o. female African-American femalewith a known history of sarcoidosis, obstructive sleep apnea, hypopituitarism on low dose Prednisone, dyslipidemia and CVA with residual right hemiparesis, who presented to the emergency room with acute onset of altered mental status with incoherence and confusion. The patient was diagnosed with UTI.  Patient febrile with elevated white blood cell count, procalcitonin and evidence of renal insufficiency above baseline.  Started on broad spectrum antibiotics to include Cefepime, Vancomycin and Flagyl.  There has been improvement in her fever, white blood cell count and renal insufficiency although not yet back to baseline. LFT's were elevated as well.  Also improving.  Mental status has not returned to baseline.  MRI of the brain personally reviewed and shows no acute changes.   Patient with baseline neurological deficits and superimposed infection which places patient at increased risk for development  of an encephalopathy with infection and metabolic abnormalities.  This also places her at increased risk for a longer time to return to baseline or close to baseline.  Often in these cases improvement in cognition lag behind improvement in lab work and other parameters.  Patient does not appear to be worsening.  Would also rule out contribution form other metabolic factors including possible hyperammonemia and worsening pituitary function.  Cortisol was normal.  Seizure on the differential as well with history  of infarct and outpatient use of Tramadol which decreases seizure threshold.   There may also be a contribution from her antibiotics since Cefepime does have an increased incidence of neurotoxicity.  There is some concern as to need for LP but feel changes are moe likely metabolic at this time.  Patient would also not be candidate for this procedure at this time due to elevated INR.  Would expect a more precipitous decline from presentation though if patient had an inadequately treated meningitis.    Recommendations: 1. TSH, prolactin, serum ammonia, B1 2. Would change Cefepime, if possible, to an antibiotic with less predisposition to neurotoxicity 3.  EEG 4. Agree with discontinuation of Tramadol for now.   5. Will continue to follow with you   Case discussed with Dr. Priscella Mann   LOS: 2 days   Alexis Goodell, MD Neurology [phone] 08/24/2019  12:50 PM

## 2019-08-24 NOTE — Progress Notes (Signed)
PROGRESS NOTE    Morgan Carpenter  TKW:[id number] DOB: 01-Nov-1950 DOA: 08/21/2019 PCP: Michail Sermon, MD  Brief Narrative:  69 year old female has had multiple readmissions recently.  She has a resident at short-term rehab at [facility] and was discharged after recent hospital stay.  Patient is unable to provide history so the majority the history was gained by speaking with the patient's husband at bedside.  He is increasingly concerned with her declining level of function.  We spent some time talking about her spinal stenosis.  She does have lumbar spinal stenosis and is being evaluated for an outpatient procedure through physician at [facility].  There was a concern for cervical stenosis however MRI performed on this admission did not support that.  Patient had significantly elevated procalcitonin and INR on admission.  Exact etiology of these are unclear.  Treating for presumptive sepsis with urinary source.  Blood and urine cultures have been taken and are pending.  Will maintain broad-spectrum antibiotics on board until that time.  5/21: Patient remains significantly altered encephalopathic.  She responds to her name but remains relatively nonverbal and intermittently crying out.  Procalcitonin downtrending.  All cultures remain negative.  No fevers over interval.  Consulted neurology, recommendations appreciated.  Exact etiology of encephalopathy is unclear.  Could be due to polypharmacy but difficult to distinguish in the setting of acute infection.   5/22: Patient remained hemodynamically stable but significantly encephalopathic.  Cold up in bed.  Appears uncomfortable.  Does respond to name however unable to follow commands or answer any questions.  Procalcitonin and metabolic markers have been improving.  Urine culture positive for Enterococcus faecalis, sensitive to vancomycin.  Low-grade fever noted over interval T-max 100.9.  Case discussed with neurology consult Dr. Thad Ranger.  Recommendations  greatly appreciated.  Continue to suspect metabolic origin of patient's encephalopathy.   Assessment & Plan:   Active Problems:   Severe sepsis (HCC)   Pressure injury of skin  Acute toxic encephalopathy Unclear etiology At baseline patient is apparently communicative and expressive Husband has noticed a deterioration in mentation and overall level of health in the past month At this time encephalopathy could be infection driven versus polypharmacy Ideally would get MRI brain however patient is currently unable to follow commands CT head unrevealing MRI brain unrevealing for acute infarct.  Case and images reviewed by neurology Plan: Continue vancomycin Change cefepime to Rocephin to decrease potential for neurotoxicity Add empiric Flagyl for anaerobic coverage Avoid sedatives or anticholinergics Frequent reorienting measures Delirium precautions Can consider LP however per neurology if patient were to have an untreated meningitis clinically she would be deteriorating which has not been the case.  Will reevaluate daily.  Severe sepsis Enterococcus faecalis UTI Urine culture positive for Enterococcus faecalis sensitive to vancomycin Continue broad-spectrum IV antibiotics Follow blood cultures  Hypothyroidism Continue Synthroid TSH WNL  Hyperlipidemia Continue statin  ADHD On Adderall XR.  This may be contributing factor to encephalopathy  Neurosarcoidosis Lumbar spinal stenosis Patient follows with neurologist and neurosurgeon at [facility] MRI lumbar spine on previous admission did demonstrate lumbar spinal stenosis Cervical spine did not demonstrate any cervical canal stenosis PT and OT consults when able   DVT prophylaxis: SCDs Code Status: Full Family Communication: husband at bedside on 5/21 Disposition Plan: Status is: Inpatient  Remains inpatient appropriate because:Altered mental status   Dispo: The patient is from: SNF              Anticipated d/c is to:  SNF  Anticipated d/c date is: > 3 days              Patient currently is not medically stable to d/c.  Still persistently encephalopathic.  Unclear etiology.  Continue treatment for severe sepsis.   Consultants:   Neurology  Procedures:   None  Antimicrobials:   Vancomycin, 08/22/2019-    Rocephin, 08/24/19-  Flagyl, 08/24/19-     Subjective: Seen and examined.  Unable to provide history.  Remains encephalopathic  Objective: Vitals:   08/24/19 0530 08/24/19 0737 08/24/19 1100 08/24/19 1156  BP: (!) 115/101 118/72  112/84  Pulse: (!) 105 (!) 106  (!) 115  Resp: 20 18 18 17   Temp: 99.7 F (37.6 C) 98.2 F (36.8 C)  99.2 F (37.3 C)  TempSrc: Axillary Axillary  Oral  SpO2: 99% 100%  98%  Weight:      Height:        Intake/Output Summary (Last 24 hours) at 08/24/2019 1349 Last data filed at 08/24/2019 1254 Gross per 24 hour  Intake --  Output 1600 ml  Net -1600 ml   Filed Weights   08/21/19 2301 08/22/19 2115 08/23/19 0525  Weight: 83.5 kg 83.7 kg 84 kg    Examination:  General exam: Encephalopathic.  Appears chronically ill Respiratory system: Normal work of breathing.  Bibasilar crackles Cardiovascular system: Tachycardic, regular rhythm, no murmurs. Gastrointestinal system: Soft, nontender, nondistended, normal bowel sounds Central nervous system: Unable to assess Extremities: Unable to assess Skin: No rashes, lesions or ulcers Psychiatry: Unable to assess, confused   Data Reviewed: I have personally reviewed following labs and imaging studies  CBC: Recent Labs  Lab 08/21/19 2317 08/22/19 0355 08/23/19 0529 08/24/19 0551  WBC 11.8* 8.7 9.7 10.3  NEUTROABS 6.3 4.9 5.0 6.5  HGB 12.9 12.3 10.4* 9.6*  HCT 39.1 37.4 32.5* 30.5*  MCV 84.8 83.9 87.1 87.4  PLT 786* 521* 622* 102*   Basic Metabolic Panel: Recent Labs  Lab 08/21/19 2317 08/22/19 0355 08/23/19 0529 08/24/19 0551  NA 141 141 144 148*  K 3.9 4.0 3.8 3.4*  CL 100  109 111 116*  CO2 27 24 22 22   GLUCOSE 95 104* 86 95  BUN 13 13 17 14   CREATININE 2.14* 1.64* 1.31* 0.99  CALCIUM 9.1 7.8* 8.3* 8.5*   GFR: Estimated Creatinine Clearance: 57.8 mL/min (by C-G formula based on SCr of 0.99 mg/dL). Liver Function Tests: Recent Labs  Lab 08/21/19 2317 08/22/19 0355  AST 141* 119*  ALT 43 37  ALKPHOS 93 75  BILITOT 1.0 1.0  PROT 6.5 5.4*  ALBUMIN 2.8* 2.3*   No results for input(s): LIPASE, AMYLASE in the last 168 hours. No results for input(s): AMMONIA in the last 168 hours. Coagulation Profile: Recent Labs  Lab 08/22/19 0355 08/23/19 0529 08/24/19 0551  INR 7.0* 4.5* 2.4*   Cardiac Enzymes: No results for input(s): CKTOTAL, CKMB, CKMBINDEX, TROPONINI in the last 168 hours. BNP (last 3 results) No results for input(s): PROBNP in the last 8760 hours. HbA1C: No results for input(s): HGBA1C in the last 72 hours. CBG: No results for input(s): GLUCAP in the last 168 hours. Lipid Profile: No results for input(s): CHOL, HDL, LDLCALC, TRIG, CHOLHDL, LDLDIRECT in the last 72 hours. Thyroid Function Tests: No results for input(s): TSH, T4TOTAL, FREET4, T3FREE, THYROIDAB in the last 72 hours. Anemia Panel: No results for input(s): VITAMINB12, FOLATE, FERRITIN, TIBC, IRON, RETICCTPCT in the last 72 hours. Sepsis Labs: Recent Labs  Lab 08/21/19 2317 08/22/19 0109  08/22/19 0355 08/22/19 0640 08/23/19 0529 08/24/19 0551  PROCALCITON  --   --  37.93  --  18.46 8.04  LATICACIDVEN 4.6* 3.2* 2.3* 1.9  --   --     Recent Results (from the past 240 hour(s))  Blood culture (routine x 2)     Status: None (Preliminary result)   Collection Time: 08/21/19 11:24 PM   Specimen: Right Antecubital; Blood  Result Value Ref Range Status   Specimen Description RIGHT ANTECUBITAL  Final   Special Requests   Final    BOTTLES DRAWN AEROBIC AND ANAEROBIC Blood Culture results may not be optimal due to an excessive volume of blood received in culture bottles    Culture   Final    NO GROWTH 2 DAYS Performed at [facility], [address] [place], [place] [address]    Report Status PENDING  Incomplete  Blood culture (routine x 2)     Status: None (Preliminary result)   Collection Time: 08/21/19 11:29 PM   Specimen: BLOOD RIGHT WRIST  Result Value Ref Range Status   Specimen Description BLOOD RIGHT WRIST  Final   Special Requests   Final    BOTTLES DRAWN AEROBIC AND ANAEROBIC Blood Culture results may not be optimal due to an excessive volume of blood received in culture bottles   Culture   Final    NO GROWTH 2 DAYS Performed at [facility], [address], [place], [place] [address]    Report Status PENDING  Incomplete  Urine Culture     Status: Abnormal   Collection Time: 08/22/19 12:46 AM   Specimen: Urine, Catheterized  Result Value Ref Range Status   Specimen Description   Final    URINE, CATHETERIZED Performed at [facility], [address] [place], [place] [address]    Special Requests   Final    NONE Performed at [facility], [address] [place], [place] [address]    Culture 40,000 COLONIES/mL ENTEROCOCCUS FAECIUM (A)  Final   Report Status 08/24/2019 FINAL  Final   Organism ID, Bacteria ENTEROCOCCUS FAECIUM (A)  Final      Susceptibility   Enterococcus faecium - MIC*    AMPICILLIN >=32 RESISTANT Resistant     NITROFURANTOIN 128 RESISTANT Resistant     VANCOMYCIN <=0.5 SENSITIVE Sensitive     * 40,000 COLONIES/mL ENTEROCOCCUS FAECIUM  SARS Coronavirus 2 by RT PCR (hospital order, performed in [facility] hospital lab) Nasopharyngeal Nasopharyngeal Swab     Status: None   Collection Time: 08/22/19  1:09 AM   Specimen: Nasopharyngeal Swab  Result Value Ref Range Status   SARS Coronavirus 2 NEGATIVE NEGATIVE Final    Comment: (NOTE) SARS-CoV-2 target nucleic acids are NOT DETECTED. The SARS-CoV-2 RNA is generally detectable in upper and lower respiratory specimens during the  acute phase of infection. The lowest concentration of SARS-CoV-2 viral copies this assay can detect is 250 copies / mL. A negative result does not preclude SARS-CoV-2 infection and should not be used as the sole basis for treatment or other patient management decisions.  A negative result may occur with improper specimen collection / handling, submission of specimen other than nasopharyngeal swab, presence of viral mutation(s) within the areas targeted by this assay, and inadequate number of viral copies (<250 copies / mL). A negative result must be combined with clinical observations, patient history, and epidemiological information. Fact Sheet for Patients:   BoilerBrush.com.cy Fact Sheet for Healthcare Providers: https://pope.com/ This test is  not yet approved or cleared  by the [place] and has been authorized for detection and/or diagnosis of SARS-CoV-2 by FDA under an Emergency Use Authorization (EUA).  This EUA will remain in effect (meaning this test can be used) for the duration of the COVID-19 declaration under Section 564(b)(1) of the Act, 21 U.S.C. section 360bbb-3(b)(1), unless the authorization is terminated or revoked sooner. Performed at [facility], [address] [place], [place] [address]   MRSA PCR Screening     Status: None   Collection Time: 08/23/19  2:45 AM   Specimen: Nasopharyngeal  Result Value Ref Range Status   MRSA by PCR NEGATIVE NEGATIVE Final    Comment:        The GeneXpert MRSA Assay (FDA approved for NASAL specimens only), is one component of a comprehensive MRSA colonization surveillance program. It is not intended to diagnose MRSA infection nor to guide or monitor treatment for MRSA infections. Performed at [facility], [address] [place], [place] [address]          Radiology Studies: MR BRAIN WO CONTRAST  Result Date: 08/23/2019 CLINICAL DATA:   Encephalopathy. EXAM: MRI HEAD WITHOUT CONTRAST TECHNIQUE: Multiplanar, multiecho pulse sequences of the brain and surrounding structures were obtained without intravenous contrast. COMPARISON:  Head CT 08/21/2019 FINDINGS: The study is severely motion degraded despite the use of faster, more motion resistant imaging protocols. Sagittal T1 and coronal T2 sequences were not obtained. Brain: No acute infarct, extra-axial fluid collection, or significant intracranial mass effect is identified. There is a moderate-sized chronic left MCA infarct involving the left frontal greater than parietal lobes and insula with associated chronic blood products and ex vacuo dilatation of the left lateral ventricle. Vascular: Susceptibility artifact from aneurysm coils at the left ICA terminus. Major intracranial vascular flow voids are grossly preserved. Skull and upper cervical spine: No destructive skull lesion. Sinuses/Orbits: Unremarkable orbits. Mild mucosal thickening in the paranasal sinuses. Clear mastoid air cells. Other: None. IMPRESSION: 1. Severely motion degraded, incomplete examination. 2. No acute infarct. 3. Chronic left MCA infarct. Electronically Signed   By: Sebastian Ache M.D.   On: 08/23/2019 13:42        Scheduled Meds: . atorvastatin  40 mg Oral Daily  . FLUoxetine  10 mg Oral Daily  . gabapentin  200 mg Oral TID  . levothyroxine  75 mcg Oral Q0600  . multivitamin with minerals  1 tablet Oral Daily  . pantoprazole  40 mg Oral Daily  . predniSONE  5 mg Oral Q breakfast  . rivaroxaban  20 mg Oral Daily   Continuous Infusions: . sodium chloride 100 mL/hr at 08/24/19 0815  . cefTRIAXone (ROCEPHIN)  IV    . metronidazole 500 mg (08/24/19 1152)  . vancomycin       LOS: 2 days    Time spent: 35 minutes    Tresa Moore, MD [facility] Pager [phone]  If 7PM-7AM, please contact night-coverage 08/24/2019, 1:49 PM

## 2019-08-25 LAB — CBC WITH DIFFERENTIAL/PLATELET
Abs Immature Granulocytes: 0.03 10*3/uL (ref 0.00–0.07)
Basophils Absolute: 0.1 10*3/uL (ref 0.0–0.1)
Basophils Relative: 1 %
Eosinophils Absolute: 0.4 10*3/uL (ref 0.0–0.5)
Eosinophils Relative: 4 %
HCT: 30.8 % — ABNORMAL LOW (ref 36.0–46.0)
Hemoglobin: 10.1 g/dL — ABNORMAL LOW (ref 12.0–15.0)
Immature Granulocytes: 0 %
Lymphocytes Relative: 21 %
Lymphs Abs: 2.1 10*3/uL (ref 0.7–4.0)
MCH: 27.7 pg (ref 26.0–34.0)
MCHC: 32.8 g/dL (ref 30.0–36.0)
MCV: 84.6 fL (ref 80.0–100.0)
Monocytes Absolute: 1 10*3/uL (ref 0.1–1.0)
Monocytes Relative: 10 %
Neutro Abs: 6.5 10*3/uL (ref 1.7–7.7)
Neutrophils Relative %: 64 %
Platelets: 486 10*3/uL — ABNORMAL HIGH (ref 150–400)
RBC: 3.64 MIL/uL — ABNORMAL LOW (ref 3.87–5.11)
RDW: 17.7 % — ABNORMAL HIGH (ref 11.5–15.5)
WBC: 10 10*3/uL (ref 4.0–10.5)
nRBC: 0.2 % (ref 0.0–0.2)

## 2019-08-25 LAB — PROLACTIN: Prolactin: 3.6 ng/mL — ABNORMAL LOW (ref 4.8–23.3)

## 2019-08-25 LAB — BASIC METABOLIC PANEL
Anion gap: 8 (ref 5–15)
BUN: 12 mg/dL (ref 8–23)
CO2: 22 mmol/L (ref 22–32)
Calcium: 8.7 mg/dL — ABNORMAL LOW (ref 8.9–10.3)
Chloride: 124 mmol/L — ABNORMAL HIGH (ref 98–111)
Creatinine, Ser: 0.92 mg/dL (ref 0.44–1.00)
GFR calc Af Amer: >60 mL/min (ref >60)
GFR calc non Af Amer: >60 mL/min (ref >60)
Glucose, Bld: 96 mg/dL (ref 70–99)
Potassium: 3.5 mmol/L (ref 3.5–5.1)
Sodium: 154 mmol/L — ABNORMAL HIGH (ref 135–145)

## 2019-08-25 LAB — PROCALCITONIN: Procalcitonin: 4.39 ng/mL

## 2019-08-25 LAB — PROTIME-INR
INR: 2.6 — ABNORMAL HIGH (ref 0.8–1.2)
Prothrombin Time: 27 seconds — ABNORMAL HIGH (ref 11.4–15.2)

## 2019-08-25 LAB — T4, FREE: Free T4: 0.91 ng/dL (ref 0.61–1.12)

## 2019-08-25 MED ORDER — ONDANSETRON HCL 4 MG/2ML IJ SOLN: 4.0000 mg | Freq: Four times a day (QID) | INTRAMUSCULAR | Status: DC | PRN

## 2019-08-25 MED ORDER — SODIUM CHLORIDE 0.45 % IV SOLN: INTRAVENOUS | Status: DC

## 2019-08-25 NOTE — Progress Notes (Signed)
Subjective: Unfortunately do not see any improvement  Objective: Current vital signs: BP (!) 154/69 (BP Location: Left Arm)   Pulse (!) 109   Temp 98.2 F (36.8 C)   Resp 18   Ht 5\' 4"  (1.626 m)   Wt 84.4 kg   SpO2 100%   BMI 31.94 kg/m  Vital signs in last 24 hours: Temp:  [97.9 F (36.6 C)-98.4 F (36.9 C)] 98.2 F (36.8 C) (05/23 1205) Pulse Rate:  [87-110] 109 (05/23 1205) Resp:  [18-21] 18 (05/23 1205) BP: (94-154)/(69-93) 154/69 (05/23 1205) SpO2:  [97 %-100 %] 100 % (05/23 1205) Weight:  [84.4 kg] 84.4 kg (05/23 0334)  Intake/Output from previous day: 05/22 0701 - 05/23 0700 In: 2505.6 [I.V.:1944.3; IV Piggyback:561.3] Out: 1600 [Urine:1600] Intake/Output this shift: No intake/output data recorded. Nutritional status:  Diet Order            Diet Heart Room service appropriate? Yes; Fluid consistency: Thin  Diet effective now              Neurologic Exam: Mental Status: Awake.  Turns to examiner when name called.  Some one word responses clear but for the most part speech is unintelligible.  Does not follow commands.  . Cranial Nerves: II: Pupils equal, round, reactive to light and accommodation Patient would not cooperate for remainder of cranial nerve testing Motor: Moves left upper extremity more than RUE.  Will not allow me to move her legs Sensory: Appears to appreciate touch throughout  Lab Results: Basic Metabolic Panel: Recent Labs  Lab 08/21/19 2317 08/21/19 2317 08/22/19 0355 08/22/19 0355 08/23/19 0529 08/24/19 0551 08/25/19 0607  NA 141  --  141  --  144 148* 154*  K 3.9  --  4.0  --  3.8 3.4* 3.5  CL 100  --  109  --  111 116* 124*  CO2 27  --  24  --  22 22 22   GLUCOSE 95  --  104*  --  86 95 96  BUN 13  --  13  --  17 14 12   CREATININE 2.14*  --  1.64*  --  1.31* 0.99 0.92  CALCIUM 9.1   < > 7.8*   < > 8.3* 8.5* 8.7*   < > = values in this interval not displayed.    Liver Function Tests: Recent Labs  Lab 08/21/19 2317  08/22/19 0355  AST 141* 119*  ALT 43 37  ALKPHOS 93 75  BILITOT 1.0 1.0  PROT 6.5 5.4*  ALBUMIN 2.8* 2.3*   No results for input(s): LIPASE, AMYLASE in the last 168 hours. Recent Labs  Lab 08/24/19 1411  AMMONIA 11    CBC: Recent Labs  Lab 08/21/19 2317 08/22/19 0355 08/23/19 0529 08/24/19 0551 08/25/19 0607  WBC 11.8* 8.7 9.7 10.3 10.0  NEUTROABS 6.3 4.9 5.0 6.5 6.5  HGB 12.9 12.3 10.4* 9.6* 10.1*  HCT 39.1 37.4 32.5* 30.5* 30.8*  MCV 84.8 83.9 87.1 87.4 84.6  PLT 786* 521* 622* 511* 486*    Cardiac Enzymes: No results for input(s): CKTOTAL, CKMB, CKMBINDEX, TROPONINI in the last 168 hours.  Lipid Panel: No results for input(s): CHOL, TRIG, HDL, CHOLHDL, VLDL, LDLCALC in the last 168 hours.  CBG: No results for input(s): GLUCAP in the last 168 hours.  Microbiology: Results for orders placed or performed during the hospital encounter of 08/21/19  Blood culture (routine x 2)     Status: None (Preliminary result)   Collection Time: 08/21/19  11:24 PM   Specimen: Right Antecubital; Blood  Result Value Ref Range Status   Specimen Description RIGHT ANTECUBITAL  Final   Special Requests   Final    BOTTLES DRAWN AEROBIC AND ANAEROBIC Blood Culture results may not be optimal due to an excessive volume of blood received in culture bottles   Culture   Final    NO GROWTH 3 DAYS Performed at [facility], [address], [place], [place] [address]    Report Status PENDING  Incomplete  Blood culture (routine x 2)     Status: None (Preliminary result)   Collection Time: 08/21/19 11:29 PM   Specimen: BLOOD RIGHT WRIST  Result Value Ref Range Status   Specimen Description BLOOD RIGHT WRIST  Final   Special Requests   Final    BOTTLES DRAWN AEROBIC AND ANAEROBIC Blood Culture results may not be optimal due to an excessive volume of blood received in culture bottles   Culture   Final    NO GROWTH 3 DAYS Performed at [facility], [address]  [place], [place] [address]    Report Status PENDING  Incomplete  Urine Culture     Status: Abnormal   Collection Time: 08/22/19 12:46 AM   Specimen: Urine, Catheterized  Result Value Ref Range Status   Specimen Description   Final    URINE, CATHETERIZED Performed at [facility], [address] [place], [place] [address]    Special Requests   Final    NONE Performed at [facility], [address] [place], [place] [address]    Culture 40,000 COLONIES/mL ENTEROCOCCUS FAECIUM (A)  Final   Report Status 08/24/2019 FINAL  Final   Organism ID, Bacteria ENTEROCOCCUS FAECIUM (A)  Final      Susceptibility   Enterococcus faecium - MIC*    AMPICILLIN >=32 RESISTANT Resistant     NITROFURANTOIN 128 RESISTANT Resistant     VANCOMYCIN <=0.5 SENSITIVE Sensitive     * 40,000 COLONIES/mL ENTEROCOCCUS FAECIUM  SARS Coronavirus 2 by RT PCR (hospital order, performed in [facility] hospital lab) Nasopharyngeal Nasopharyngeal Swab     Status: None   Collection Time: 08/22/19  1:09 AM   Specimen: Nasopharyngeal Swab  Result Value Ref Range Status   SARS Coronavirus 2 NEGATIVE NEGATIVE Final    Comment: (NOTE) SARS-CoV-2 target nucleic acids are NOT DETECTED. The SARS-CoV-2 RNA is generally detectable in upper and lower respiratory specimens during the acute phase of infection. The lowest concentration of SARS-CoV-2 viral copies this assay can detect is 250 copies / mL. A negative result does not preclude SARS-CoV-2 infection and should not be used as the sole basis for treatment or other patient management decisions.  A negative result may occur with improper specimen collection / handling, submission of specimen other than nasopharyngeal swab, presence of viral mutation(s) within the areas targeted by this assay, and inadequate number of viral copies (<250 copies / mL). A negative result must be combined with clinical observations, patient history, and epidemiological  information. Fact Sheet for Patients:   BoilerBrush.com.cy Fact Sheet for Healthcare Providers: https://pope.com/ This test is not yet approved or cleared  by the [place] FDA and has been authorized for detection and/or diagnosis of SARS-CoV-2 by FDA under an Emergency Use Authorization (EUA).  This EUA will remain in effect (meaning this test can be used) for the duration of the COVID-19 declaration under Section 564(b)(1) of the Act, 21 U.S.C. section 360bbb-3(b)(1), unless the authorization is  terminated or revoked sooner. Performed at [facility], [address] [place], [place] [address]   MRSA PCR Screening     Status: None   Collection Time: 08/23/19  2:45 AM   Specimen: Nasopharyngeal  Result Value Ref Range Status   MRSA by PCR NEGATIVE NEGATIVE Final    Comment:        The GeneXpert MRSA Assay (FDA approved for NASAL specimens only), is one component of a comprehensive MRSA colonization surveillance program. It is not intended to diagnose MRSA infection nor to guide or monitor treatment for MRSA infections. Performed at [facility], [address] [place], [place] [address]     Coagulation Studies: Recent Labs    08/23/19 5361 08/24/19 0551 08/25/19 0607  LABPROT 41.7* 25.2* 27.0*  INR 4.5* 2.4* 2.6*    Imaging: MR BRAIN WO CONTRAST  Result Date: 08/23/2019 CLINICAL DATA:  Encephalopathy. EXAM: MRI HEAD WITHOUT CONTRAST TECHNIQUE: Multiplanar, multiecho pulse sequences of the brain and surrounding structures were obtained without intravenous contrast. COMPARISON:  Head CT 08/21/2019 FINDINGS: The study is severely motion degraded despite the use of faster, more motion resistant imaging protocols. Sagittal T1 and coronal T2 sequences were not obtained. Brain: No acute infarct, extra-axial fluid collection, or significant intracranial mass effect is identified. There is a moderate-sized  chronic left MCA infarct involving the left frontal greater than parietal lobes and insula with associated chronic blood products and ex vacuo dilatation of the left lateral ventricle. Vascular: Susceptibility artifact from aneurysm coils at the left ICA terminus. Major intracranial vascular flow voids are grossly preserved. Skull and upper cervical spine: No destructive skull lesion. Sinuses/Orbits: Unremarkable orbits. Mild mucosal thickening in the paranasal sinuses. Clear mastoid air cells. Other: None. IMPRESSION: 1. Severely motion degraded, incomplete examination. 2. No acute infarct. 3. Chronic left MCA infarct. Electronically Signed   By: Logan Bores M.D.   On: 08/23/2019 13:42    Medications:  I have reviewed the patient's current medications. Scheduled: . atorvastatin  40 mg Oral Daily  . FLUoxetine  10 mg Oral Daily  . gabapentin  200 mg Oral TID  . levothyroxine  75 mcg Oral Q0600  . multivitamin with minerals  1 tablet Oral Daily  . pantoprazole  40 mg Oral Daily  . predniSONE  5 mg Oral Q breakfast  . rivaroxaban  20 mg Oral Daily    Assessment/Plan: 69 y.o. female African-American femalewith a known history of sarcoidosis, obstructive sleep apnea, hypopituitarism on low dose Prednisone, dyslipidemia and CVA with residual right hemiparesis, who presented to the emergency room with acute onset of altered mental status with incoherence and confusion. The patient was diagnosed with UTI.  Patient febrile with elevated white blood cell count, procalcitonin and evidence of renal insufficiency above baseline.  Started on broad spectrum antibiotics to include Cefepime, Vancomycin and Flagyl.  There has been improvement in her fever, white blood cell count and renal insufficiency although not yet back to baseline. LFT's were elevated as well.  Also improving.  Mental status has not returned to baseline.  MRI of the brain personally reviewed and shows no acute changes.   Patient with  baseline neurological deficits and superimposed infection which places patient at increased risk for development of an encephalopathy with infection and metabolic abnormalities.  This also places her at increased risk for a longer time to return to baseline or close to baseline.  Often in these cases improvement in cognition lag behind improvement in lab work and  other parameters.  Patient does not appear to be worsening.  Would also rule out contribution form other metabolic factors including possible hyperammonemia and worsening pituitary function.  Cortisol was normal.  Seizure on the differential as well with history of infarct and outpatient use of Tramadol which decreases seizure threshold.   There may also be a contribution from her antibiotics since Cefepime does have an increased incidence of neurotoxicity.  There is some concern as to need for LP but feel changes are moe likely metabolic at this time.  Patient would also not be candidate for this procedure at this time due to elevated INR.  Would expect a more precipitous decline from presentation though if patient had an inadequately treated meningitis.    Recommendations:  - TSH very low, treatment as per primary team. No active sings of hyperthyroidism.  - EEG for tomorrow    LOS: 3 days    08/25/2019  12:43 PM

## 2019-08-25 NOTE — Progress Notes (Signed)
PROGRESS NOTE    Morgan Carpenter  KZS:[id number] DOB: 1950/05/29 DOA: 08/21/2019 PCP: Michail Sermon, MD  Brief Narrative:  69 year old female has had multiple readmissions recently.  She has a resident at short-term rehab at [facility] and was discharged after recent hospital stay.  Patient is unable to provide history so the majority the history was gained by speaking with the patient's husband at bedside.  He is increasingly concerned with her declining level of function.  We spent some time talking about her spinal stenosis.  She does have lumbar spinal stenosis and is being evaluated for an outpatient procedure through physician at [facility].  There was a concern for cervical stenosis however MRI performed on this admission did not support that.  Patient had significantly elevated procalcitonin and INR on admission.  Exact etiology of these are unclear.  Treating for presumptive sepsis with urinary source.  Blood and urine cultures have been taken and are pending.  Will maintain broad-spectrum antibiotics on board until that time.  5/21: Patient remains significantly altered encephalopathic.  She responds to her name but remains relatively nonverbal and intermittently crying out.  Procalcitonin downtrending.  All cultures remain negative.  No fevers over interval.  Consulted neurology, recommendations appreciated.  Exact etiology of encephalopathy is unclear.  Could be due to polypharmacy but difficult to distinguish in the setting of acute infection.   5/22: Patient remained hemodynamically stable but significantly encephalopathic.  Cold up in bed.  Appears uncomfortable.  Does respond to name however unable to follow commands or answer any questions.  Procalcitonin and metabolic markers have been improving.  Urine culture positive for Enterococcus faecalis, sensitive to vancomycin.  Low-grade fever noted over interval T-max 100.9.  Case discussed with neurology consult Dr. Thad Ranger.  Recommendations  greatly appreciated.  Continue to suspect metabolic origin of patient's encephalopathy.  5/23: Remains encephalopathic.  Appears less agitated than she was yesterday.  Does respond to her name however unable to hold a conversation.  Unintelligible speech.  Seen by neurology.  Recommending EEG.  TSH very low.  Could be related to underlying endocrine disorder.   Assessment & Plan:   Active Problems:   Severe sepsis (HCC)   Pressure injury of skin  Acute toxic encephalopathy Unclear etiology At baseline patient is apparently communicative and expressive Husband has noticed a deterioration in mentation and overall level of health in the past month At this time encephalopathy could be infection driven versus polypharmacy Ideally would get MRI brain however patient is currently unable to follow commands CT head unrevealing MRI brain unrevealing for acute infarct.  Case and images reviewed by neurology Plan: Continue vancomycin Continue Rocephin Continue Flagyl although can likely discontinue this medication within 24 hours Avoid sedatives or anticholinergics Frequent reorienting measures Delirium precautions Can consider LP however per neurology if patient were to have an untreated meningitis clinically she would be deteriorating which has not been the case.  Will reevaluate daily. TSH very low.  No clinical signs of hyperthyroidism.  Will order free T4, total T3, free T3.  Hold Synthroid for now pending results. EEG tomorrow  Severe sepsis Enterococcus faecalis UTI Urine culture positive for Enterococcus faecalis sensitive to vancomycin Continue broad-spectrum IV antibiotics as above Follow blood cultures, no growth to date  Hypothyroidism Patient on Synthroid.  TSH nearly undetectable.  Unclear etiology.  No clinical signs of hyperthyroidism noted.  Will hold Synthroid for now.  Check free T4, free T3, total T3.  Reevaluate need for thyroid replacement based  on  results.  Hyperlipidemia Continue statin  ADHD On Adderall XR.  This may be contributing factor to encephalopathy  Neurosarcoidosis Lumbar spinal stenosis Patient follows with neurologist and neurosurgeon at [facility] MRI lumbar spine on previous admission did demonstrate lumbar spinal stenosis Cervical spine did not demonstrate any cervical canal stenosis PT and OT consults when able   DVT prophylaxis: SCDs Code Status: Full Family Communication: husband at bedside on 5/22.  Attempted to call on 5/23, unable to reach on home or cell phones Disposition Plan: Status is: Inpatient  Remains inpatient appropriate because:Altered mental status   Dispo: The patient is from: SNF              Anticipated d/c is to: SNF              Anticipated d/c date is: > 3 days              Patient currently is not medically stable to d/c.  Still persistently encephalopathic.  Unclear etiology.  Continue treatment for severe sepsis.   Consultants:   Neurology  Procedures:   None  Antimicrobials:   Vancomycin, 08/22/2019-    Rocephin, 08/24/19-  Flagyl, 08/24/19-     Subjective: Seen and examined.  Unable to provide history.  Remains encephalopathic  Objective: Vitals:   08/24/19 1948 08/25/19 0334 08/25/19 0734 08/25/19 1205  BP: 127/83 (!) 143/80 (!) 132/93 (!) 154/69  Pulse: 89 87 (!) 110 (!) 109  Resp:   18 18  Temp:  97.9 F (36.6 C) 98.3 F (36.8 C) 98.2 F (36.8 C)  TempSrc:  Oral    SpO2: 97% 100% 100% 100%  Weight:  84.4 kg    Height:        Intake/Output Summary (Last 24 hours) at 08/25/2019 1323 Last data filed at 08/25/2019 1309 Gross per 24 hour  Intake 2505.61 ml  Output 2000 ml  Net 505.61 ml   Filed Weights   08/22/19 2115 08/23/19 0525 08/25/19 0334  Weight: 83.7 kg 84 kg 84.4 kg    Examination:  General exam: Encephalopathic.  Appears chronically ill Respiratory system: Normal work of breathing.  Bibasilar crackles Cardiovascular system:  Tachycardic, regular rhythm, no murmurs. Gastrointestinal system: Soft, nontender, nondistended, normal bowel sounds Central nervous system: Unable to assess Extremities: Unable to assess Skin: No rashes, lesions or ulcers Psychiatry: Unable to assess, confused   Data Reviewed: I have personally reviewed following labs and imaging studies  CBC: Recent Labs  Lab 08/21/19 2317 08/22/19 0355 08/23/19 0529 08/24/19 0551 08/25/19 0607  WBC 11.8* 8.7 9.7 10.3 10.0  NEUTROABS 6.3 4.9 5.0 6.5 6.5  HGB 12.9 12.3 10.4* 9.6* 10.1*  HCT 39.1 37.4 32.5* 30.5* 30.8*  MCV 84.8 83.9 87.1 87.4 84.6  PLT 786* 521* 622* 511* 486*   Basic Metabolic Panel: Recent Labs  Lab 08/21/19 2317 08/22/19 0355 08/23/19 0529 08/24/19 0551 08/25/19 0607  NA 141 141 144 148* 154*  K 3.9 4.0 3.8 3.4* 3.5  CL 100 109 111 116* 124*  CO2 27 24 22 22 22   GLUCOSE 95 104* 86 95 96  BUN 13 13 17 14 12   CREATININE 2.14* 1.64* 1.31* 0.99 0.92  CALCIUM 9.1 7.8* 8.3* 8.5* 8.7*   GFR: Estimated Creatinine Clearance: 62.4 mL/min (by C-G formula based on SCr of 0.92 mg/dL). Liver Function Tests: Recent Labs  Lab 08/21/19 2317 08/22/19 0355  AST 141* 119*  ALT 43 37  ALKPHOS 93 75  BILITOT 1.0 1.0  PROT  6.5 5.4*  ALBUMIN 2.8* 2.3*   No results for input(s): LIPASE, AMYLASE in the last 168 hours. Recent Labs  Lab 08/24/19 1411  AMMONIA 11   Coagulation Profile: Recent Labs  Lab 08/22/19 0355 08/23/19 0529 08/24/19 0551 08/25/19 0607  INR 7.0* 4.5* 2.4* 2.6*   Cardiac Enzymes: No results for input(s): CKTOTAL, CKMB, CKMBINDEX, TROPONINI in the last 168 hours. BNP (last 3 results) No results for input(s): PROBNP in the last 8760 hours. HbA1C: No results for input(s): HGBA1C in the last 72 hours. CBG: No results for input(s): GLUCAP in the last 168 hours. Lipid Profile: No results for input(s): CHOL, HDL, LDLCALC, TRIG, CHOLHDL, LDLDIRECT in the last 72 hours. Thyroid Function  Tests: Recent Labs    08/24/19 1411  TSH <0.010*   Anemia Panel: No results for input(s): VITAMINB12, FOLATE, FERRITIN, TIBC, IRON, RETICCTPCT in the last 72 hours. Sepsis Labs: Recent Labs  Lab 08/21/19 2317 08/22/19 0109 08/22/19 0355 08/22/19 0640 08/23/19 0529 08/24/19 0551 08/25/19 0607  PROCALCITON  --   --  37.93  --  18.46 8.04 4.39  LATICACIDVEN 4.6* 3.2* 2.3* 1.9  --   --   --     Recent Results (from the past 240 hour(s))  Blood culture (routine x 2)     Status: None (Preliminary result)   Collection Time: 08/21/19 11:24 PM   Specimen: Right Antecubital; Blood  Result Value Ref Range Status   Specimen Description RIGHT ANTECUBITAL  Final   Special Requests   Final    BOTTLES DRAWN AEROBIC AND ANAEROBIC Blood Culture results may not be optimal due to an excessive volume of blood received in culture bottles   Culture   Final    NO GROWTH 3 DAYS Performed at [facility], [address], [place], [place] [address]    Report Status PENDING  Incomplete  Blood culture (routine x 2)     Status: None (Preliminary result)   Collection Time: 08/21/19 11:29 PM   Specimen: BLOOD RIGHT WRIST  Result Value Ref Range Status   Specimen Description BLOOD RIGHT WRIST  Final   Special Requests   Final    BOTTLES DRAWN AEROBIC AND ANAEROBIC Blood Culture results may not be optimal due to an excessive volume of blood received in culture bottles   Culture   Final    NO GROWTH 3 DAYS Performed at [facility], [address], [place], [place] [address]    Report Status PENDING  Incomplete  Urine Culture     Status: Abnormal   Collection Time: 08/22/19 12:46 AM   Specimen: Urine, Catheterized  Result Value Ref Range Status   Specimen Description   Final    URINE, CATHETERIZED Performed at [facility], [address] [place], [place] [address]    Special Requests   Final    NONE Performed at [facility], [address]  [place], [place] [address]    Culture 40,000 COLONIES/mL ENTEROCOCCUS FAECIUM (A)  Final   Report Status 08/24/2019 FINAL  Final   Organism ID, Bacteria ENTEROCOCCUS FAECIUM (A)  Final      Susceptibility   Enterococcus faecium - MIC*    AMPICILLIN >=32 RESISTANT Resistant     NITROFURANTOIN 128 RESISTANT Resistant     VANCOMYCIN <=0.5 SENSITIVE Sensitive     * 40,000 COLONIES/mL ENTEROCOCCUS FAECIUM  SARS Coronavirus 2 by RT PCR (hospital order, performed in [facility] hospital lab) Nasopharyngeal Nasopharyngeal Swab     Status:  None   Collection Time: 08/22/19  1:09 AM   Specimen: Nasopharyngeal Swab  Result Value Ref Range Status   SARS Coronavirus 2 NEGATIVE NEGATIVE Final    Comment: (NOTE) SARS-CoV-2 target nucleic acids are NOT DETECTED. The SARS-CoV-2 RNA is generally detectable in upper and lower respiratory specimens during the acute phase of infection. The lowest concentration of SARS-CoV-2 viral copies this assay can detect is 250 copies / mL. A negative result does not preclude SARS-CoV-2 infection and should not be used as the sole basis for treatment or other patient management decisions.  A negative result may occur with improper specimen collection / handling, submission of specimen other than nasopharyngeal swab, presence of viral mutation(s) within the areas targeted by this assay, and inadequate number of viral copies (<250 copies / mL). A negative result must be combined with clinical observations, patient history, and epidemiological information. Fact Sheet for Patients:   BoilerBrush.com.cy Fact Sheet for Healthcare Providers: https://pope.com/ This test is not yet approved or cleared  by the [place] FDA and has been authorized for detection and/or diagnosis of SARS-CoV-2 by FDA under an Emergency Use Authorization (EUA).  This EUA will remain in effect (meaning this test can be used) for the duration of  the COVID-19 declaration under Section 564(b)(1) of the Act, 21 U.S.C. section 360bbb-3(b)(1), unless the authorization is terminated or revoked sooner. Performed at [facility], [address] [place], [place] [address]   MRSA PCR Screening     Status: None   Collection Time: 08/23/19  2:45 AM   Specimen: Nasopharyngeal  Result Value Ref Range Status   MRSA by PCR NEGATIVE NEGATIVE Final    Comment:        The GeneXpert MRSA Assay (FDA approved for NASAL specimens only), is one component of a comprehensive MRSA colonization surveillance program. It is not intended to diagnose MRSA infection nor to guide or monitor treatment for MRSA infections. Performed at [facility], [address], [place], [place] [address]          Radiology Studies: No results found.      Scheduled Meds: . atorvastatin  40 mg Oral Daily  . FLUoxetine  10 mg Oral Daily  . gabapentin  200 mg Oral TID  . levothyroxine  75 mcg Oral Q0600  . multivitamin with minerals  1 tablet Oral Daily  . pantoprazole  40 mg Oral Daily  . predniSONE  5 mg Oral Q breakfast  . rivaroxaban  20 mg Oral Daily   Continuous Infusions: . sodium chloride 75 mL/hr at 08/25/19 0741  . cefTRIAXone (ROCEPHIN)  IV 2 g (08/24/19 2153)  . metronidazole 500 mg (08/25/19 0814)  . vancomycin 750 mg (08/25/19 0524)     LOS: 3 days    Time spent: 35 minutes    Tresa Moore, MD [facility] Pager [phone]  If 7PM-7AM, please contact night-coverage 08/25/2019, 1:23 PM

## 2019-08-26 ENCOUNTER — Inpatient Hospital Stay: Payer: Medicare Other

## 2019-08-26 ENCOUNTER — Encounter: Payer: Self-pay | Admitting: Family Medicine

## 2019-08-26 DIAGNOSIS — E2749 Other adrenocortical insufficiency: Secondary | ICD-10-CM

## 2019-08-26 DIAGNOSIS — G9340 Encephalopathy, unspecified: Secondary | ICD-10-CM

## 2019-08-26 DIAGNOSIS — N390 Urinary tract infection, site not specified: Secondary | ICD-10-CM

## 2019-08-26 DIAGNOSIS — D8689 Sarcoidosis of other sites: Secondary | ICD-10-CM

## 2019-08-26 DIAGNOSIS — R509 Fever, unspecified: Secondary | ICD-10-CM

## 2019-08-26 LAB — CRYPTOCOCCAL ANTIGEN: Crypto Ag: NEGATIVE

## 2019-08-26 LAB — BASIC METABOLIC PANEL
Anion gap: 8 (ref 5–15)
BUN: 9 mg/dL (ref 8–23)
CO2: 22 mmol/L (ref 22–32)
Calcium: 8.2 mg/dL — ABNORMAL LOW (ref 8.9–10.3)
Chloride: 125 mmol/L — ABNORMAL HIGH (ref 98–111)
Creatinine, Ser: 0.96 mg/dL (ref 0.44–1.00)
GFR calc Af Amer: >60 mL/min (ref >60)
GFR calc non Af Amer: >60 mL/min (ref >60)
Glucose, Bld: 83 mg/dL (ref 70–99)
Potassium: 2.9 mmol/L — ABNORMAL LOW (ref 3.5–5.1)
Sodium: 155 mmol/L — ABNORMAL HIGH (ref 135–145)

## 2019-08-26 LAB — HEPATIC FUNCTION PANEL
ALT: 45 U/L — ABNORMAL HIGH (ref 0–44)
AST: 76 U/L — ABNORMAL HIGH (ref 15–41)
Albumin: 2.1 g/dL — ABNORMAL LOW (ref 3.5–5.0)
Alkaline Phosphatase: 61 U/L (ref 38–126)
Bilirubin, Direct: 0.1 mg/dL (ref 0.0–0.2)
Indirect Bilirubin: 0.6 mg/dL (ref 0.3–0.9)
Total Bilirubin: 0.7 mg/dL (ref 0.3–1.2)
Total Protein: 5.3 g/dL — ABNORMAL LOW (ref 6.5–8.1)

## 2019-08-26 LAB — CBC WITH DIFFERENTIAL/PLATELET
Abs Immature Granulocytes: 0.04 10*3/uL (ref 0.00–0.07)
Basophils Absolute: 0.1 10*3/uL (ref 0.0–0.1)
Basophils Relative: 1 %
Eosinophils Absolute: 0.5 10*3/uL (ref 0.0–0.5)
Eosinophils Relative: 5 %
HCT: 29.5 % — ABNORMAL LOW (ref 36.0–46.0)
Hemoglobin: 9.7 g/dL — ABNORMAL LOW (ref 12.0–15.0)
Immature Granulocytes: 0 %
Lymphocytes Relative: 31 %
Lymphs Abs: 3.2 10*3/uL (ref 0.7–4.0)
MCH: 27.7 pg (ref 26.0–34.0)
MCHC: 32.9 g/dL (ref 30.0–36.0)
MCV: 84.3 fL (ref 80.0–100.0)
Monocytes Absolute: 1.3 10*3/uL — ABNORMAL HIGH (ref 0.1–1.0)
Monocytes Relative: 13 %
Neutro Abs: 5.1 10*3/uL (ref 1.7–7.7)
Neutrophils Relative %: 50 %
Platelets: 484 10*3/uL — ABNORMAL HIGH (ref 150–400)
RBC: 3.5 MIL/uL — ABNORMAL LOW (ref 3.87–5.11)
RDW: 17.8 % — ABNORMAL HIGH (ref 11.5–15.5)
WBC: 10.2 10*3/uL (ref 4.0–10.5)
nRBC: 0.4 % — ABNORMAL HIGH (ref 0.0–0.2)

## 2019-08-26 LAB — PROCALCITONIN: Procalcitonin: 2.45 ng/mL

## 2019-08-26 LAB — T3, FREE: T3, Free: 1.2 pg/mL — ABNORMAL LOW (ref 2.0–4.4)

## 2019-08-26 LAB — T3: T3, Total: 64 ng/dL — ABNORMAL LOW (ref 71–180)

## 2019-08-26 LAB — PROTIME-INR
INR: 3.2 — ABNORMAL HIGH (ref 0.8–1.2)
Prothrombin Time: 32 seconds — ABNORMAL HIGH (ref 11.4–15.2)

## 2019-08-26 LAB — CORTISOL: Cortisol, Plasma: 3.9 ug/dL

## 2019-08-26 MED ORDER — IOHEXOL 300 MG/ML  SOLN
100.0000 mL | Freq: Once | INTRAMUSCULAR | Status: AC | PRN
  Administered 2019-08-26: 100 mL via INTRAVENOUS

## 2019-08-26 MED ORDER — POTASSIUM CHLORIDE 10 MEQ/100ML IV SOLN
10.0000 meq | INTRAVENOUS | Status: AC
  Administered 2019-08-26 (×5): 10 meq via INTRAVENOUS
  Filled 2019-08-26 (×6): qty 100

## 2019-08-26 MED ORDER — LEVOTHYROXINE SODIUM 50 MCG PO TABS
75.0000 ug | ORAL_TABLET | Freq: Every day | ORAL | Status: DC
  Administered 2019-08-27 – 2019-09-10 (×15): 75 ug via ORAL
  Filled 2019-08-26 (×14): qty 1

## 2019-08-26 MED ORDER — DEXTROSE 5 % IV SOLN: INTRAVENOUS | Status: DC

## 2019-08-26 MED ORDER — PHYTONADIONE 5 MG PO TABS
5.0000 mg | ORAL_TABLET | Freq: Once | ORAL | Status: AC
  Administered 2019-08-26: 5 mg via ORAL
  Filled 2019-08-26: qty 1

## 2019-08-26 MED ORDER — HYDROCORTISONE NA SUCCINATE PF 100 MG IJ SOLR
50.0000 mg | Freq: Four times a day (QID) | INTRAMUSCULAR | Status: DC
  Administered 2019-08-26 – 2019-08-30 (×16): 50 mg via INTRAVENOUS
  Filled 2019-08-26 (×19): qty 1

## 2019-08-26 NOTE — Consult Note (Signed)
NAME: Morgan Carpenter  DOB: Jan 12, 1951  MRN: [id number]  Date/Time: 08/26/2019 12:16 PM  REQUESTING PROVIDER: Priscella Mann Subjective:  REASON FOR CONSULT: fever ?No history from patient, chart reviewed spoke to husband- also looked records in Care everywhere Morgan Carpenter is a 69 y.o. 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 Patient has got multiple comorbidities.  In 2009 she had a stroke and was diagnosed with an unruptured aneurysm on the left MCA which was coiled.  That left her with right-sided weakness.  She was in [place] at that time.  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  Neurosarcoid was the working diagnosis.  Since she moved to [place] she has been followed at [facility].  She is followed by neurology, endocrine, rheumatology and family medicine.  She is also been seen by urogynecologist.  And orthopedics.  As per husband since her stroke, patient has not been able to ambulate well and now she is pretty much in a transport chair.  She used to live at home with him until March 2021 when when she was admitted to [facility] with left-sided weakness.  Her cognition is also not what it used to be before.  At baseline 2 months ago she could still hold a conversation and watch TV. Between June 26, 2019 and July 03, 2018 when she was admitted to [facility] with left leg weakness.  A prior admission in September 2020 with similar complaints led to MRI of the spine and that showed   cervical myelopathy due to stenosis and lumbar stenosis spondylolisthesis.  This  was stable during the March admission.  She was also noted on a regular urine examination to have a UTI and a culture showed Citrobacter freundii and she was given 3 days of Zosyn .  She was sent to [place] for rehab.  She was admitted to [facility] between 08/01/2019 until 08/09/2019 from home with bilateral knee pain and fall at home.  She had MRI of the lumbar spine which was similar to the prior MRI done at [facility].  She was evaluated by PT and asked to go to SNF she was she was treated for Citrobacter UTI with ceftriaxone and nitrofurantoin and [facility]  [facility]thopedic at [facility] wanted her to have an MRI of her cervical spine to evaluate the spinal stenosis for possible surgery. She came to [facility] radiology on 08/21/2019 and a cervical spine MRI was done which showed widespread cervical disc and endplate degeneration but fairly capacious underlying spinal canal with no spinal stenosis.  Moderate and occasionally severe neural foraminal stenosis at left C4, left C5, bilateral C6 and left C7 nerve levels. As per husband patient was not her usual self that day and after MRI when she went back to [facility] the staff noted that she was obtunded and hence she was sent back to the ED.  As per the EMS note she was diagnosed with UTI a day before in the nursing home and was on antibiotic.  EMS notes show a temperature of 101.1.  The ED note says she was confused but will still answer questions but mostly  unintelligible speech. Vitals reveal a blood pressure of 118/74, heart rate of 128, respiratory rate of 22 and temperature of 98.8. Urine and blood cultures were sent. WBC on admission was 11.8, hemoglobin of 12.9, creatinine of 2.14, platelet of 786, lactate of 4.6 she started on IV fluids and IV vancomycin, cefepime and Flagyl.  MRI of the brain showed chronic encephalomalacia in the left MCA territory with sequelae of prior aneurysm coil embolization of the left ICA terminus.  The next day the procalcitonin was  37.93.  Urine culture was positive for Enterococcus  which was resistant to ampicillin.  She was evaluated by neurologist who felt that she had an encephalopathy secondary to infection.  Concern for medication induced encephalopathy was also in the mix and cefepime which could aggravate encephalopathy was changed to ceftriaxone.  I am asked to see the patient and she continues to have intermittent fever and her mental status is not back to baseline.  She has history of mixed incontinence of the bladder and has been followed by urologist.  She had a urodynamic study done on 09/02/2016 and it showed overactive bladder.  There was no residual urine.  She was offered multiple interventions and she chose the posterior tibial nerve stimulation.  She got a few sessions.  As there was not much improvement she had Botox injection on 09/29/2017.   She had cystitis with Pseudomonas November 22, 2017. And was treated with ciprofloxacin for 1 week.  She last saw urogynecologist in January 2020  Past medical history Lymphocytic hypophysitis Neuro sarcoidosis Hypopituitarism Vitamin D insufficiency Hyperlipidemia Hyperprolactinemia Hypertrophic hypogonadism syndrome Hypothyroidism Secondary adrenal insufficiency Hemiplegia following CVA Cerebral aneurysm Optic atrophy of both eyes Obstructive sleep apnea PE Degenerative disc disease Lumbar sacral radiculopathy Restless leg syndrome Orthostatic hypotension Dyslipidemia Recurrent deep vein thrombosis No blood transfusion Jehovah's Witness    Past Surgical History:  Procedure Laterality Date  . CHOLECYSTECTOMY    . NOSE SURGERY      Family history Prostate cancer father Diabetes Brother Rheumatoid arthritis sister Breast cancer sister Myocardial infarction Brother Hypertension Brother CVA Father Cancer mother  Allergies  Allergen Reactions  . Naprosyn [Naproxen] Hives  . Shellfish Allergy Hives  . Sulfa Antibiotics Hives   Home  medications Prednisone 5 mg once a day Xarelto Atorvastatin Adderall 30 mg XR capsule 1 a day Fluoxetine Gabapentin Mirabegron Omeprazole Ropinirole Tizanidine Vitamin D3 Levothyroxine 75 mcg  ? Current Facility-Administered Medications  Medication Dose Route Frequency Provider Last Rate Last Admin  . acetaminophen (TYLENOL) tablet 650 mg  650 mg Oral Q6H PRN Mansy, Jan A, MD   650 mg at 08/26/19 0957   Or  . acetaminophen (TYLENOL) suppository 650 mg  650 mg Rectal Q6H PRN Mansy, Jan A, MD      . atorvastatin (LIPITOR) tablet 40 mg  40 mg Oral Daily Mansy, Jan A, MD   40 mg at 08/25/19 1717  . cefTRIAXone (ROCEPHIN) 2 g in sodium chloride 0.9 % 100 mL IVPB  2 g Intravenous Q24H Tresa Moore, MD   Stopped at 08/25/19 2132  . dextrose 5 % solution   Intravenous Continuous Sreenath, Sudheer B, MD      . FLUoxetine (PROZAC) capsule 10 mg  10 mg Oral Daily Mansy, Jan A, MD   10 mg at 08/26/19 0827  . magnesium hydroxide (MILK OF MAGNESIA) suspension 30 mL  30 mL Oral Daily PRN Mansy, Jan A, MD      . metroNIDAZOLE (FLAGYL) IVPB 500 mg  500 mg Intravenous Q8H Tresa Moore, MD   Stopped at 08/26/19 [phone]  . multivitamin with minerals tablet 1 tablet  1 tablet Oral Daily Mansy, Jan A, MD   1 tablet at 08/26/19 0826  . ondansetron (ZOFRAN) injection 4 mg  4 mg Intravenous Q6H PRN Lolita Patella B, MD      . pantoprazole (PROTONIX) EC tablet 40 mg  40 mg Oral Daily Mansy, Jan A, MD   40 mg at 08/26/19 0826  . potassium chloride 10 mEq in 100 mL IVPB  10 mEq Intravenous Q1 Hr x 6 Sreenath, Sudheer B, MD 100 mL/hr at 08/26/19 1003 10 mEq at 08/26/19 1003  . predniSONE (DELTASONE) tablet 5 mg  5 mg Oral Q breakfast Lolita Patella B, MD   5 mg at 08/26/19 0827  . traZODone (DESYREL) tablet 25 mg  25 mg Oral QHS PRN Mansy, Jan A, MD   25 mg at 08/24/19 2156  . vancomycin (VANCOREADY) IVPB 750 mg/150 mL  750 mg Intravenous Q12H Lolita Patella B, MD 150 mL/hr at 08/26/19 0527  750 mg at 08/26/19 0527     Abtx:  Anti-infectives (From admission, onward)   Start     Dose/Rate Route Frequency Ordered Stop   08/24/19 2000  cefTRIAXone (ROCEPHIN) 2 g in sodium chloride 0.9 % 100 mL IVPB     2 g 200 mL/hr over 30 Minutes Intravenous Every 24 hours 08/24/19 1259     08/24/19 1800  vancomycin (VANCOREADY) IVPB 750 mg/150 mL     750 mg 150 mL/hr over 60 Minutes Intravenous Every 12 hours 08/24/19 1140     08/24/19 1030  metroNIDAZOLE (FLAGYL) IVPB 500 mg     500 mg 100 mL/hr over 60 Minutes Intravenous Every 8 hours 08/24/19 1026     08/22/19 2200  vancomycin (VANCOREADY) IVPB 1250 mg/250 mL  Status:  Discontinued     1,250 mg 166.7 mL/hr over 90 Minutes Intravenous Every 24 hours 08/22/19 0743 08/24/19 1140   08/22/19 1000  ceFEPIme (MAXIPIME) 2 g in sodium chloride 0.9 % 100 mL IVPB  Status:  Discontinued     2 g 200 mL/hr over 30 Minutes Intravenous Every 12 hours 08/22/19 0743 08/24/19 1258   08/22/19 0700  cefTRIAXone (ROCEPHIN) 1 g in sodium chloride 0.9 % 100 mL IVPB  Status:  Discontinued     1 g 200 mL/hr over 30 Minutes Intravenous Every 24 hours 08/22/19 0204 08/22/19 0710   08/22/19 0115  vancomycin (VANCOREADY) IVPB 500 mg/100 mL     500 mg 100 mL/hr over 60 Minutes Intravenous  Once 08/22/19 0108 08/22/19 0525   08/22/19 0030  ceFEPIme (MAXIPIME) 2 g in sodium chloride 0.9 % 100 mL IVPB     2 g 200 mL/hr over 30 Minutes Intravenous  Once 08/22/19 0016 08/22/19 0142   08/22/19 0030  metroNIDAZOLE (FLAGYL) IVPB 500 mg     500 mg 100 mL/hr over 60 Minutes Intravenous  Once 08/22/19 0016 08/22/19 0525   08/22/19 0030  vancomycin (VANCOCIN) IVPB 1000 mg/200 mL premix     1,000 mg 200 mL/hr over 60 Minutes Intravenous  Once 08/22/19 0016 08/22/19 0525      REVIEW OF SYSTEMS:  NA Objective:  VITALS:  BP (!) 126/103 (BP Location: Right Arm) Comment: pt not at rest  Pulse (!) 112   Temp (!) 101.5 F (38.6 C) (Axillary)   Resp 17   Ht  (1.626  m)   Wt 84.4 kg  SpO2 90%   BMI 31.94 kg/m  PHYSICAL EXAM:  General: Obtunded, on calling her name she will opens her eyes and follow simple commands like opening her mouth and sticking her tongue out.  Speech is unintelligible.  She is restless moving her head from side to side eyes:  Conjunctivae clear, anicteric sclerae.  Pupils react to right.  Irregularity in the left pupil Neck supple. Oral cavity unable to examine completely.  Poor dentition noted Neck: Supple, symmetrical, no adenopathy, thyroid: non tender no carotid bruit and no JVD. Back: Stage II sacral wound.  Skin has been lifted up lungs: Bilateral air entry. Heart: S1-S2 Abdomen: Soft, non-tender,not distended. Bowel sounds normal. No masses Extremities: Edema legs On the right lateral aspect there is superficial ischemia likely pressure related  Linear scratch on the right leg. Left heel has a small pressure area        skin: Intertrigo under the breasts Neurologic: Cannot be assessed Pertinent Labs Lab Results CBC    Component Value Date/Time   WBC 10.2 08/26/2019 0340   RBC 3.50 (L) 08/26/2019 0340   HGB 9.7 (L) 08/26/2019 0340   HCT 29.5 (L) 08/26/2019 0340   PLT 484 (H) 08/26/2019 0340   MCV 84.3 08/26/2019 0340   MCH 27.7 08/26/2019 0340   MCHC 32.9 08/26/2019 0340   RDW 17.8 (H) 08/26/2019 0340   LYMPHSABS 3.2 08/26/2019 0340   MONOABS 1.3 (H) 08/26/2019 0340   EOSABS 0.5 08/26/2019 0340   BASOSABS 0.1 08/26/2019 0340    CMP Latest Ref Rng & Units 08/26/2019 08/25/2019 08/24/2019  Glucose 70 - 99 mg/dL 83 96 95  BUN 8 - 23 mg/dL 9 12 14   Creatinine 0.44 - 1.00 mg/dL 0.980.96 1.190.92 1.470.99  Sodium 135 - 145 mmol/L 155(H) 154(H) 148(H)  Potassium 3.5 - 5.1 mmol/L 2.9(L) 3.5 3.4(L)  Chloride 98 - 111 mmol/L 125(H) 124(H) 116(H)  CO2 22 - 32 mmol/L 22 22 22   Calcium 8.9 - 10.3 mg/dL 8.2(L) 8.7(L) 8.5(L)  Total Protein 6.5 - 8.1 g/dL - - -  Total Bilirubin 0.3 - 1.2 mg/dL - - -  Alkaline Phos 38 -  126 U/L - - -  AST 15 - 41 U/L - - -  ALT 0 - 44 U/L - - -      Microbiology: Recent Results (from the past 240 hour(s))  Blood culture (routine x 2)     Status: None (Preliminary result)   Collection Time: 08/21/19 11:24 PM   Specimen: Right Antecubital; Blood  Result Value Ref Range Status   Specimen Description RIGHT ANTECUBITAL  Final   Special Requests   Final    BOTTLES DRAWN AEROBIC AND ANAEROBIC Blood Culture results may not be optimal due to an excessive volume of blood received in culture bottles   Culture   Final    NO GROWTH 4 DAYS Performed at [facility], [address] [place], [place] [id number]    Report Status PENDING  Incomplete  Blood culture (routine x 2)     Status: None (Preliminary result)   Collection Time: 08/21/19 11:29 PM   Specimen: BLOOD RIGHT WRIST  Result Value Ref Range Status   Specimen Description BLOOD RIGHT WRIST  Final   Special Requests   Final    BOTTLES DRAWN AEROBIC AND ANAEROBIC Blood Culture results may not be optimal due to an excessive volume of blood received in culture bottles   Culture   Final    NO GROWTH 4 DAYS  Performed at [facility], [address] [place], [place] [address]    Report Status PENDING  Incomplete  Urine Culture     Status: Abnormal   Collection Time: 08/22/19 12:46 AM   Specimen: Urine, Catheterized  Result Value Ref Range Status   Specimen Description   Final    URINE, CATHETERIZED Performed at [facility], [address] [place], [place] [address]    Special Requests   Final    NONE Performed at [facility], [address] [place], [place] [address]    Culture 40,000 COLONIES/mL ENTEROCOCCUS FAECIUM (A)  Final   Report Status 08/24/2019 FINAL  Final   Organism ID, Bacteria ENTEROCOCCUS FAECIUM (A)  Final      Susceptibility   Enterococcus faecium - MIC*    AMPICILLIN >=32 RESISTANT Resistant     NITROFURANTOIN 128 RESISTANT Resistant     VANCOMYCIN  <=0.5 SENSITIVE Sensitive     * 40,000 COLONIES/mL ENTEROCOCCUS FAECIUM  SARS Coronavirus 2 by RT PCR (hospital order, performed in [facility] hospital lab) Nasopharyngeal Nasopharyngeal Swab     Status: None   Collection Time: 08/22/19  1:09 AM   Specimen: Nasopharyngeal Swab  Result Value Ref Range Status   SARS Coronavirus 2 NEGATIVE NEGATIVE Final    Comment: (NOTE) SARS-CoV-2 target nucleic acids are NOT DETECTED. The SARS-CoV-2 RNA is generally detectable in upper and lower respiratory specimens during the acute phase of infection. The lowest concentration of SARS-CoV-2 viral copies this assay can detect is 250 copies / mL. A negative result does not preclude SARS-CoV-2 infection and should not be used as the sole basis for treatment or other patient management decisions.  A negative result may occur with improper specimen collection / handling, submission of specimen other than nasopharyngeal swab, presence of viral mutation(s) within the areas targeted by this assay, and inadequate number of viral copies (<250 copies / mL). A negative result must be combined with clinical observations, patient history, and epidemiological information. Fact Sheet for Patients:   BoilerBrush.com.cy Fact Sheet for Healthcare Providers: https://pope.com/ This test is not yet approved or cleared  by the [place] FDA and has been authorized for detection and/or diagnosis of SARS-CoV-2 by FDA under an Emergency Use Authorization (EUA).  This EUA will remain in effect (meaning this test can be used) for the duration of the COVID-19 declaration under Section 564(b)(1) of the Act, 21 U.S.C. section 360bbb-3(b)(1), unless the authorization is terminated or revoked sooner. Performed at [facility], [address] [place], [place] [address]   MRSA PCR Screening     Status: None   Collection Time: 08/23/19  2:45 AM   Specimen:  Nasopharyngeal  Result Value Ref Range Status   MRSA by PCR NEGATIVE NEGATIVE Final    Comment:        The GeneXpert MRSA Assay (FDA approved for NASAL specimens only), is one component of a comprehensive MRSA colonization surveillance program. It is not intended to diagnose MRSA infection nor to guide or monitor treatment for MRSA infections. Performed at [facility], [address] [place], [place] [address]     IMAGING RESULTS: MRI of the brain reviewed Severely motion degraded and there is a chronic left MCA infarct. I have personally reviewed the films ? Impression/Recommendation 69 y.o. 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 ? ?Encephalopathy with intermittent fever. Patient is currently being treated for UTI but has not seen much of a response with her mental status which is not at baseline.  Still has intermittent fever. Is the encephalopathy secondary to  infection and metabolic abnormalities as she had  AKI on presentation?.  Is a due to medication? Need to rule out CNS infection .  Will need lumbar puncture.  HSV and bacterial meningitis less likely.  But will need to rule out atypical like cryptococcus . Could this encephalopathy and ongoing fever be secondary to  adrenal insufficiency and she  not on stress dose steroids. Patient currently on vancomycin, ceftriaxone(not meningitic dose] and Flagyl.  We will discontinue Flagyl as it can contribute to encephalopathy recommend CT abdomen to look for any abscess The Enterococcus  in the urine is sensitive to vancomycin but patient continues to have fever. We will get a bladder scan to look  for residue History of mixed incontinence with overactive bladder with recurrent UTI.  High procalcitonin much improved Normal cortisol level on 12/22/2019.  Recent Enterococcus UTI in the nursing home and she was on linezolid from 08/20/2019. Could she have  serotonin syndrome secondary to drug -drug interactions with fluoxetine tizanidine tramadol and linezolid  Abnormal INR and PTT.  On admission the INR was 7 and PTT was 58.4.  Unclear reason.  Patient was only on Xarelto.  Transaminitis on admission. ?  Medication?  Infection  Normal bilirubin and ammonia.  AKI on admission has resolved  Hypernatremia.  She is going to need free water.  May have to place an NG tube.  Hypokalemia  Secondary hypothyroidism on Synthroid.  Her normal dose of 75 mcg/day  TSH less than 0.10, ( low) T3 is 64 which is low and free T-4 is 0.91 ( normal).  Not clear how to interpret these results.  Could be from the acute illness??.  Endocrine consult   History of lymphocytic hypophysitis diagnosed in 2012 history of secondary adrenal insufficiency on chronic low-dose prednisone  History of secondary hypothyroidism on thyroxine History of neurosarcoid  History of cervical vertebrae neural foraminal stenosis History of lumbar stenosis  History of CVA in 2009 due to left MCA infarct secondary to left unruptured posterior communicating aneurysm needing coiling  History of optic atrophy because of pituitary lesion in 2012 ? _History of DVT on Xarelto __________________________________________________ Discussed the management with the hospitalist and her husband Spent more than an hour reviewing the chart, examining the patient and talking to her husband.  Note:  This document was prepared using Dragon voice recognition software and may include unintentional dictation errors.

## 2019-08-26 NOTE — Progress Notes (Signed)
   08/26/19 0732  Assess: MEWS Score  Temp (!) 101.5 F (38.6 C)  BP (!) 126/103 (pt not at rest)  Pulse Rate (!) 112  Resp 17  SpO2 90 %  Assess: MEWS Score  MEWS Temp 2  MEWS Systolic 0  MEWS Pulse 2  MEWS RR 0  MEWS LOC 0  MEWS Score 4  MEWS Score Color Red  Assess: if the MEWS score is Yellow or Red  Were vital signs taken at a resting state? Yes  Focused Assessment Documented focused assessment  Early Detection of Sepsis Score *See Row Information* High  MEWS guidelines implemented *See Row Information* No, previously yellow, continue vital signs every 4 hours (previously red and yellow throughout prior shift)  Notify: Provider  Provider Candie Mile MD  Date Provider Notified 08/26/19  Time Provider Notified 0800  Notification Type Page  Notification Reason Other (Comment) (update)  Response See new orders  Date of Provider Response 08/26/19  Time of Provider Response 0800

## 2019-08-26 NOTE — Progress Notes (Signed)
eeg completed ° °

## 2019-08-26 NOTE — Progress Notes (Signed)
Subjective: Patient remains altered although some improved from a neurological standpoint.  Has continued to be febrile.    Objective: Current vital signs: BP (!) 126/103 (BP Location: Right Arm) Comment: pt not at rest  Pulse (!) 112   Temp (!) 101.5 F (38.6 C) (Axillary)   Resp 17   Ht 5\' 4"  (1.626 m)   Wt 84.4 kg   SpO2 90%   BMI 31.94 kg/m  Vital signs in last 24 hours: Temp:  [97.9 F (36.6 C)-101.5 F (38.6 C)] 101.5 F (38.6 C) (05/24 0732) Pulse Rate:  [94-113] 112 (05/24 0732) Resp:  [17-26] 17 (05/24 0732) BP: (126-156)/(61-132) 126/103 (05/24 0732) SpO2:  [90 %-100 %] 90 % (05/24 0732)  Intake/Output from previous day: 05/23 0701 - 05/24 0700 In: 2320.7 [I.V.:1170.7; IV Piggyback:1150] Out: 600 [Urine:600] Intake/Output this shift: No intake/output data recorded. Nutritional status:  Diet Order            Diet Heart Room service appropriate? Yes; Fluid consistency: Thin  Diet effective now              Neurologic Exam: Mental Status: Awake.  Some one word responses to questioning.  Follows some simple commands.   Cranial Nerves: II: Pupils equal, round, reactive to light and accommodation III/IV/VI: EOMI Motor: Moves left upper extremity more than RUE.  Will not allow me to move her legs Sensory: Appears to appreciate touch throughout   Lab Results: Basic Metabolic Panel: Recent Labs  Lab 08/22/19 0355 08/22/19 0355 08/23/19 0529 08/23/19 0529 08/24/19 0551 08/25/19 0607 08/26/19 0340  NA 141  --  144  --  148* 154* 155*  K 4.0  --  3.8  --  3.4* 3.5 2.9*  CL 109  --  111  --  116* 124* 125*  CO2 24  --  22  --  22 22 22   GLUCOSE 104*  --  86  --  95 96 83  BUN 13  --  17  --  14 12 9   CREATININE 1.64*  --  1.31*  --  0.99 0.92 0.96  CALCIUM 7.8*   < > 8.3*   < > 8.5* 8.7* 8.2*   < > = values in this interval not displayed.    Liver Function Tests: Recent Labs  Lab 08/21/19 2317 08/22/19 0355  AST 141* 119*  ALT 43 37   ALKPHOS 93 75  BILITOT 1.0 1.0  PROT 6.5 5.4*  ALBUMIN 2.8* 2.3*   No results for input(s): LIPASE, AMYLASE in the last 168 hours. Recent Labs  Lab 08/24/19 1411  AMMONIA 11    CBC: Recent Labs  Lab 08/22/19 0355 08/23/19 0529 08/24/19 0551 08/25/19 0607 08/26/19 0340  WBC 8.7 9.7 10.3 10.0 10.2  NEUTROABS 4.9 5.0 6.5 6.5 5.1  HGB 12.3 10.4* 9.6* 10.1* 9.7*  HCT 37.4 32.5* 30.5* 30.8* 29.5*  MCV 83.9 87.1 87.4 84.6 84.3  PLT 521* 622* 511* 486* 484*    Cardiac Enzymes: No results for input(s): CKTOTAL, CKMB, CKMBINDEX, TROPONINI in the last 168 hours.  Lipid Panel: No results for input(s): CHOL, TRIG, HDL, CHOLHDL, VLDL, LDLCALC in the last 168 hours.  CBG: No results for input(s): GLUCAP in the last 168 hours.  Microbiology: Results for orders placed or performed during the hospital encounter of 08/21/19  Blood culture (routine x 2)     Status: None (Preliminary result)   Collection Time: 08/21/19 11:24 PM   Specimen: Right Antecubital; Blood  Result Value  Ref Range Status   Specimen Description RIGHT ANTECUBITAL  Final   Special Requests   Final    BOTTLES DRAWN AEROBIC AND ANAEROBIC Blood Culture results may not be optimal due to an excessive volume of blood received in culture bottles   Culture   Final    NO GROWTH 4 DAYS Performed at [facility], [address] [place], [place] [address]    Report Status PENDING  Incomplete  Blood culture (routine x 2)     Status: None (Preliminary result)   Collection Time: 08/21/19 11:29 PM   Specimen: BLOOD RIGHT WRIST  Result Value Ref Range Status   Specimen Description BLOOD RIGHT WRIST  Final   Special Requests   Final    BOTTLES DRAWN AEROBIC AND ANAEROBIC Blood Culture results may not be optimal due to an excessive volume of blood received in culture bottles   Culture   Final    NO GROWTH 4 DAYS Performed at [facility], [address] [place], [place] [address]    Report Status  PENDING  Incomplete  Urine Culture     Status: Abnormal   Collection Time: 08/22/19 12:46 AM   Specimen: Urine, Catheterized  Result Value Ref Range Status   Specimen Description   Final    URINE, CATHETERIZED Performed at [facility], [address] [place], [place] [address]    Special Requests   Final    NONE Performed at [facility], [address] [place], [place] [address]    Culture 40,000 COLONIES/mL ENTEROCOCCUS FAECIUM (A)  Final   Report Status 08/24/2019 FINAL  Final   Organism ID, Bacteria ENTEROCOCCUS FAECIUM (A)  Final      Susceptibility   Enterococcus faecium - MIC*    AMPICILLIN >=32 RESISTANT Resistant     NITROFURANTOIN 128 RESISTANT Resistant     VANCOMYCIN <=0.5 SENSITIVE Sensitive     * 40,000 COLONIES/mL ENTEROCOCCUS FAECIUM  SARS Coronavirus 2 by RT PCR (hospital order, performed in [facility] hospital lab) Nasopharyngeal Nasopharyngeal Swab     Status: None   Collection Time: 08/22/19  1:09 AM   Specimen: Nasopharyngeal Swab  Result Value Ref Range Status   SARS Coronavirus 2 NEGATIVE NEGATIVE Final    Comment: (NOTE) SARS-CoV-2 target nucleic acids are NOT DETECTED. The SARS-CoV-2 RNA is generally detectable in upper and lower respiratory specimens during the acute phase of infection. The lowest concentration of SARS-CoV-2 viral copies this assay can detect is 250 copies / mL. A negative result does not preclude SARS-CoV-2 infection and should not be used as the sole basis for treatment or other patient management decisions.  A negative result may occur with improper specimen collection / handling, submission of specimen other than nasopharyngeal swab, presence of viral mutation(s) within the areas targeted by this assay, and inadequate number of viral copies (<250 copies / mL). A negative result must be combined with clinical observations, patient history, and epidemiological information. Fact Sheet for Patients:    BoilerBrush.com.cy Fact Sheet for Healthcare Providers: https://pope.com/ This test is not yet approved or cleared  by the [place] FDA and has been authorized for detection and/or diagnosis of SARS-CoV-2 by FDA under an Emergency Use Authorization (EUA).  This EUA will remain in effect (meaning this test can be used) for the duration of the COVID-19 declaration under Section 564(b)(1) of the Act, 21 U.S.C. section 360bbb-3(b)(1), unless the authorization is terminated or revoked sooner. Performed at [facility], [address]  [address] [place], [place] [address]   MRSA PCR Screening     Status: None   Collection Time: 08/23/19  2:45 AM   Specimen: Nasopharyngeal  Result Value Ref Range Status   MRSA by PCR NEGATIVE NEGATIVE Final    Comment:        The GeneXpert MRSA Assay (FDA approved for NASAL specimens only), is one component of a comprehensive MRSA colonization surveillance program. It is not intended to diagnose MRSA infection nor to guide or monitor treatment for MRSA infections. Performed at [facility], [address] [place], [place] [address]     Coagulation Studies: Recent Labs    08/24/19 0551 08/25/19 0607 08/26/19 0340  LABPROT 25.2* 27.0* 32.0*  INR 2.4* 2.6* 3.2*    Imaging: No results found.  Medications:  I have reviewed the patient's current medications. Scheduled: . atorvastatin  40 mg Oral Daily  . FLUoxetine  10 mg Oral Daily  . multivitamin with minerals  1 tablet Oral Daily  . pantoprazole  40 mg Oral Daily  . predniSONE  5 mg Oral Q breakfast    Assessment/Plan: 69 y.o.femaleAfrican-American femalewith a known history of sarcoidosis, obstructive sleep apnea, hypopituitarism on low dose Prednisone, dyslipidemia and CVA with residual right hemiparesis, who presented to the emergency room with acute onset of altered mental status with incoherence and confusion. The  patient was diagnosed with UTI.  Patient febrile with elevated white blood cell count, procalcitonin and evidence of renal insufficiency above baseline.  Started on broad spectrum antibiotics to include Cefepime, Vancomycin and Flagyl.  There initially was some improvement in her fever, white blood cell count and renal insufficiency. LFT's were elevated as well.  Also improving.  Mental status is slowly improving also but has not returned to baseline.  MRI of the brain personally reviewed and shows no acute changes.   Patient with baseline neurological deficits and superimposed infection which places patient at increased risk for development of an encephalopathy with infection and metabolic abnormalities.  This also places her at increased risk for a longer time to return to baseline or close to baseline.  Often in these cases improvement in cognition lags behind improvement in lab work and other parameters.  Patient does not appear to be worsening cognitively but has continued to be febrile. Lab work to evaluate pituitary function does not suggest this as a contributing factor.  Serum ammonia is normal.   There is some concern as to need for LP, particularly with continued fevers.  Patient with elevated INR (3.2) therefore would not be safe to perform at this time.    Recommendations: 1. ID involvement 2. If continue to feel LP is warranted correction of coagulopathy would be necessary prior to.  3. EEG   Case discussed with Dr. Georgeann Oppenheim   LOS: 4 days   Thana Farr, MD Neurology [phone] 08/26/2019  10:15 AM

## 2019-08-26 NOTE — Progress Notes (Signed)
Patients speech is incomprehensible. Agitate when checking BP/vitals. Refuses trazadone to help rest.  More verbal tonight but unable to understand. Moaning/groaning out.

## 2019-08-26 NOTE — Progress Notes (Signed)
PROGRESS NOTE    Morgan Carpenter  TSV:[id number] DOB: Aug 27, 1950 DOA: 08/21/2019 PCP: Michail Sermon, MD  Brief Narrative:  69 year old female has had multiple readmissions recently.  She has a resident at short-term rehab at [facility] and was discharged after recent hospital stay.  Patient is unable to provide history so the majority the history was gained by speaking with the patient's husband at bedside.  He is increasingly concerned with her declining level of function.  We spent some time talking about her spinal stenosis.  She does have lumbar spinal stenosis and is being evaluated for an outpatient procedure through physician at [facility].  There was a concern for cervical stenosis however MRI performed on this admission did not support that.  Patient had significantly elevated procalcitonin and INR on admission.  Exact etiology of these are unclear.  Treating for presumptive sepsis with urinary source.  Blood and urine cultures have been taken and are pending.  Will maintain broad-spectrum antibiotics on board until that time.  5/21: Patient remains significantly altered encephalopathic.  She responds to her name but remains relatively nonverbal and intermittently crying out.  Procalcitonin downtrending.  All cultures remain negative.  No fevers over interval.  Consulted neurology, recommendations appreciated.  Exact etiology of encephalopathy is unclear.  Could be due to polypharmacy but difficult to distinguish in the setting of acute infection.   5/22: Patient remained hemodynamically stable but significantly encephalopathic.  Cold up in bed.  Appears uncomfortable.  Does respond to name however unable to follow commands or answer any questions.  Procalcitonin and metabolic markers have been improving.  Urine culture positive for Enterococcus faecalis, sensitive to vancomycin.  Low-grade fever noted over interval T-max 100.9.  Case discussed with neurology consult Dr. Thad Ranger.  Recommendations  greatly appreciated.  Continue to suspect metabolic origin of patient's encephalopathy.  5/23: Remains encephalopathic.  Appears less agitated than she was yesterday.  Does respond to her name however unable to hold a conversation.  Unintelligible speech.  Seen by neurology.  Recommending EEG.  TSH very low.  Could be related to underlying endocrine disorder.  5/24: Patient seen and examined.  Remains encephalopathic.  Less agitated.  Does respond to her name but unable to hold a conversation.  Unintelligible speech.  Seen by neurology this morning.  Also consulted ID.   Assessment & Plan:   Active Problems:   Severe sepsis (HCC)   Pressure injury of skin  Acute toxic encephalopathy Unclear etiology At baseline patient is apparently communicative and expressive Husband has noticed a deterioration in mentation and overall level of health in the past month At this time encephalopathy could be infection driven versus polypharmacy CT head unrevealing MRI brain unrevealing for acute infarct.  Case and images reviewed by neurology ??  Manifestation of underlying lymphocytic hypophysitis.  Could be atypical manifestation of adrenal insufficiency Plan: Continue vancomycin Continue Rocephin Continue Flagyl  Check cryptococcal antigen Start stress dose steroids, hydrocortisone 50 mg IV every 6 hours Avoid sedatives or anticholinergics Frequent reorienting measures Delirium precautions Can consider LP.  Neurology willing to perform however would need to reverse coagulopathy prior.  There is no active clot on imaging.  Xarelto on hold.  Anticipate patient will require administration of vitamin K prior to LP attempt. Check CT abdomen pelvis with contrast  Severe sepsis Enterococcus faecalis UTI Urine culture positive for Enterococcus faecalis sensitive to vancomycin Continue broad-spectrum IV antibiotics as above Follow blood cultures, no growth to date Check CT abdomen pelvis with  contrast  for possible abscess  Hypothyroidism Patient on Synthroid.  TSH nearly undetectable.  Likely secondary to hypopituitarism.  Free T4 within normal limits.  Will restart home Synthroid 25 mcg daily  Hyperlipidemia Continue statin  ADHD On Adderall XR.  This may be contributing factor to encephalopathy  Neurosarcoidosis Lumbar spinal stenosis Patient follows with neurologist and [facility] MRI lumbar spine on previous admission did demonstrate lumbar spinal stenosis Cervical spine did not demonstrate any cervical canal stenosis PT and OT consults when able   DVT prophylaxis: SCDs Code Status: Full Family Communication: husband at bedside on 5/23.   Disposition Plan: Status is: Inpatient  Remains inpatient appropriate because:Altered mental status   Dispo: The patient is from: SNF              Anticipated d/c is to: SNF              Anticipated d/c date is: > 3 days              Patient currently is not medically stable to d/c.  Still persistently encephalopathic.  Unclear etiology.  Continue treatment for severe sepsis.   Consultants:   Neurology  Procedures:   None  Antimicrobials:   Vancomycin, 08/22/2019-    Rocephin, 08/24/19-  Flagyl, 08/24/19-     Subjective: Seen and examined.  Unable to provide history.  Remains encephalopathic  Objective: Vitals:   08/26/19 0552 08/26/19 0555 08/26/19 0732 08/26/19 1234  BP: (!) 142/63 (!) 154/61 (!) 126/103   Pulse: (!) 111 (!) 108 (!) 112   Resp:   17   Temp: (!) 100.8 F (38.2 C) 98.5 F (36.9 C) (!) 101.5 F (38.6 C) 100.3 F (37.9 C)  TempSrc: Oral Oral Axillary Oral  SpO2:   90%   Weight:      Height:        Intake/Output Summary (Last 24 hours) at 08/26/2019 1445 Last data filed at 08/26/2019 1325 Gross per 24 hour  Intake 2320.65 ml  Output 450 ml  Net 1870.65 ml   Filed Weights   08/22/19 2115 08/23/19 0525 08/25/19 0334  Weight: 83.7 kg 84 kg 84.4 kg    Examination:  General  exam: Encephalopathic.  Appears chronically ill Respiratory system: Normal work of breathing.  Bibasilar crackles Cardiovascular system: Tachycardic, regular rhythm, no murmurs. Gastrointestinal system: Soft, nontender, nondistended, normal bowel sounds Central nervous system: Unable to assess Extremities: Unable to assess Skin: No rashes, lesions or ulcers Psychiatry: Unable to assess, confused   Data Reviewed: I have personally reviewed following labs and imaging studies  CBC: Recent Labs  Lab 08/22/19 0355 08/23/19 0529 08/24/19 0551 08/25/19 0607 08/26/19 0340  WBC 8.7 9.7 10.3 10.0 10.2  NEUTROABS 4.9 5.0 6.5 6.5 5.1  HGB 12.3 10.4* 9.6* 10.1* 9.7*  HCT 37.4 32.5* 30.5* 30.8* 29.5*  MCV 83.9 87.1 87.4 84.6 84.3  PLT 521* 622* 511* 486* 484*   Basic Metabolic Panel: Recent Labs  Lab 08/22/19 0355 08/23/19 0529 08/24/19 0551 08/25/19 0607 08/26/19 0340  NA 141 144 148* 154* 155*  K 4.0 3.8 3.4* 3.5 2.9*  CL 109 111 116* 124* 125*  CO2 24 22 22 22 22   GLUCOSE 104* 86 95 96 83  BUN 13 17 14 12 9   CREATININE 1.64* 1.31* 0.99 0.92 0.96  CALCIUM 7.8* 8.3* 8.5* 8.7* 8.2*   GFR: Estimated Creatinine Clearance: 59.8 mL/min (by C-G formula based on SCr of 0.96 mg/dL). Liver Function Tests: Recent Labs  Lab 08/21/19 2317 08/22/19 0355  AST 141* 119*  ALT 43 37  ALKPHOS 93 75  BILITOT 1.0 1.0  PROT 6.5 5.4*  ALBUMIN 2.8* 2.3*   No results for input(s): LIPASE, AMYLASE in the last 168 hours. Recent Labs  Lab 08/24/19 1411  AMMONIA 11   Coagulation Profile: Recent Labs  Lab 08/22/19 0355 08/23/19 0529 08/24/19 0551 08/25/19 0607 08/26/19 0340  INR 7.0* 4.5* 2.4* 2.6* 3.2*   Cardiac Enzymes: No results for input(s): CKTOTAL, CKMB, CKMBINDEX, TROPONINI in the last 168 hours. BNP (last 3 results) No results for input(s): PROBNP in the last 8760 hours. HbA1C: No results for input(s): HGBA1C in the last 72 hours. CBG: No results for input(s): GLUCAP  in the last 168 hours. Lipid Profile: No results for input(s): CHOL, HDL, LDLCALC, TRIG, CHOLHDL, LDLDIRECT in the last 72 hours. Thyroid Function Tests: Recent Labs    08/24/19 1411 08/25/19 1400  TSH <0.010*  --   FREET4  --  0.91  T3FREE  --  1.2*   Anemia Panel: No results for input(s): VITAMINB12, FOLATE, FERRITIN, TIBC, IRON, RETICCTPCT in the last 72 hours. Sepsis Labs: Recent Labs  Lab 08/21/19 2317 08/22/19 0109 08/22/19 0355 08/22/19 0355 08/22/19 0640 08/23/19 0529 08/24/19 0551 08/25/19 0607 08/26/19 0340  PROCALCITON  --   --  37.93   < >  --  18.46 8.04 4.39 2.45  LATICACIDVEN 4.6* 3.2* 2.3*  --  1.9  --   --   --   --    < > = values in this interval not displayed.    Recent Results (from the past 240 hour(s))  Blood culture (routine x 2)     Status: None (Preliminary result)   Collection Time: 08/21/19 11:24 PM   Specimen: Right Antecubital; Blood  Result Value Ref Range Status   Specimen Description RIGHT ANTECUBITAL  Final   Special Requests   Final    BOTTLES DRAWN AEROBIC AND ANAEROBIC Blood Culture results may not be optimal due to an excessive volume of blood received in culture bottles   Culture   Final    NO GROWTH 4 DAYS Performed at [facility], [address], [place], [place] [address]    Report Status PENDING  Incomplete  Blood culture (routine x 2)     Status: None (Preliminary result)   Collection Time: 08/21/19 11:29 PM   Specimen: BLOOD RIGHT WRIST  Result Value Ref Range Status   Specimen Description BLOOD RIGHT WRIST  Final   Special Requests   Final    BOTTLES DRAWN AEROBIC AND ANAEROBIC Blood Culture results may not be optimal due to an excessive volume of blood received in culture bottles   Culture   Final    NO GROWTH 4 DAYS Performed at [facility], [address] [place], [place] [address]    Report Status PENDING  Incomplete  Urine Culture     Status: Abnormal   Collection Time: 08/22/19 12:46 AM    Specimen: Urine, Catheterized  Result Value Ref Range Status   Specimen Description   Final    URINE, CATHETERIZED Performed at [facility], [address] [place], [place] [address]    Special Requests   Final    NONE Performed at [facility], [address], [place], [place] [address]    Culture 40,000 COLONIES/mL ENTEROCOCCUS FAECIUM (A)  Final   Report Status 08/24/2019 FINAL  Final   Organism ID, Bacteria ENTEROCOCCUS FAECIUM (A)  Final      Susceptibility   Enterococcus faecium - MIC*    AMPICILLIN >=32 RESISTANT Resistant     NITROFURANTOIN 128 RESISTANT Resistant     VANCOMYCIN <=0.5 SENSITIVE Sensitive     * 40,000 COLONIES/mL ENTEROCOCCUS FAECIUM  SARS Coronavirus 2 by RT PCR (hospital order, performed in [facility] hospital lab) Nasopharyngeal Nasopharyngeal Swab     Status: None   Collection Time: 08/22/19  1:09 AM   Specimen: Nasopharyngeal Swab  Result Value Ref Range Status   SARS Coronavirus 2 NEGATIVE NEGATIVE Final    Comment: (NOTE) SARS-CoV-2 target nucleic acids are NOT DETECTED. The SARS-CoV-2 RNA is generally detectable in upper and lower respiratory specimens during the acute phase of infection. The lowest concentration of SARS-CoV-2 viral copies this assay can detect is 250 copies / mL. A negative result does not preclude SARS-CoV-2 infection and should not be used as the sole basis for treatment or other patient management decisions.  A negative result may occur with improper specimen collection / handling, submission of specimen other than nasopharyngeal swab, presence of viral mutation(s) within the areas targeted by this assay, and inadequate number of viral copies (<250 copies / mL). A negative result must be combined with clinical observations, patient history, and epidemiological information. Fact Sheet for Patients:   BoilerBrush.com.cy Fact Sheet for Healthcare  Providers: https://pope.com/ This test is not yet approved or cleared  by the [place] FDA and has been authorized for detection and/or diagnosis of SARS-CoV-2 by FDA under an Emergency Use Authorization (EUA).  This EUA will remain in effect (meaning this test can be used) for the duration of the COVID-19 declaration under Section 564(b)(1) of the Act, 21 U.S.C. section 360bbb-3(b)(1), unless the authorization is terminated or revoked sooner. Performed at [facility], [address] [place], [place] [address]   MRSA PCR Screening     Status: None   Collection Time: 08/23/19  2:45 AM   Specimen: Nasopharyngeal  Result Value Ref Range Status   MRSA by PCR NEGATIVE NEGATIVE Final    Comment:        The GeneXpert MRSA Assay (FDA approved for NASAL specimens only), is one component of a comprehensive MRSA colonization surveillance program. It is not intended to diagnose MRSA infection nor to guide or monitor treatment for MRSA infections. Performed at [facility], [address], [place], [place] [address]          Radiology Studies: US Venous Img Lower Bilateral (DVT)  Result Date: 08/26/2019 CLINICAL DATA:  DVT EXAM: BILATERAL LOWER EXTREMITY VENOUS DOPPLER ULTRASOUND TECHNIQUE: Gray-scale sonography with compression, as well as color and duplex ultrasound, were performed to evaluate the deep venous system(s) from the level of the common femoral vein through the popliteal and proximal calf veins. COMPARISON:  None. FINDINGS: VENOUS Normal compressibility of the common femoral, superficial femoral, and popliteal veins, as well as the visualized calf veins. Visualized portions of profunda femoral vein and great saphenous vein unremarkable. No filling defects to suggest DVT on grayscale or color Doppler imaging. Doppler waveforms show normal direction of venous flow, normal respiratory plasticity and response to augmentation. OTHER  None. Limitations: Body habitus. IMPRESSION: No evidence of lower extremity DVT. Electronically Signed   By: Charlett Nose M.D.   On: 08/26/2019 11:14        Scheduled Meds: . atorvastatin  40 mg Oral Daily  . FLUoxetine  10 mg Oral Daily  . hydrocortisone sod succinate (SOLU-CORTEF) inj  50 mg Intravenous Q6H  . multivitamin with minerals  1 tablet Oral Daily  . pantoprazole  40 mg Oral Daily   Continuous Infusions: . cefTRIAXone (ROCEPHIN)  IV Stopped (08/25/19 2132)  . dextrose 50 mL/hr at 08/26/19 1404  . potassium chloride 10 mEq (08/26/19 1343)  . vancomycin 750 mg (08/26/19 0527)     LOS: 4 days    Time spent: 35 minutes    Tresa Moore, MD [facility] Pager [phone]  If 7PM-7AM, please contact night-coverage 08/26/2019, 2:45 PM

## 2019-08-26 NOTE — Procedures (Signed)
ELECTROENCEPHALOGRAM REPORT   Patient: Morgan Carpenter       Room #: 250A-AA EEG No. ID: [id number] Age: 69 y.o.        Sex: female Requesting Physician: Georgeann Oppenheim Report Date:  08/26/2019        Interpreting Physician: Thana Farr  History: [name] is an 69 y.o. female with altered mental status  Medications:  Lipitor, Rocephin, Prozac, Synthroid, Vancomycin  Conditions of Recording:  This is a 21 channel routine scalp EEG performed with bipolar and monopolar montages arranged in accordance to the international 10/20 system of electrode placement. One channel was dedicated to EKG recording.  The patient is in the altered state.  Description:  Artifact is prominent during the recording often obscuring the background rhythm. When able to be visualized the background consists of poorly organized beta activity that is diffusely distributed.  This activity is continuous.   No epileptiform activity is noted.   Hyperventilation was not performed.  Intermittent photic stimulation was performed but failed to illicit any change in the tracing.    IMPRESSION: This electroencephalogram consists of continuous beta activity of unclear significance.  No epileptiform activity is noted.      Thana Farr, MD Neurology [phone] 08/26/2019, 6:55 PM

## 2019-08-26 NOTE — Consult Note (Signed)
Pharmacy Antibiotic Note  Morgan Carpenter is a 69 y.o. female admitted on 08/21/2019 with sepsis.  Pharmacy has been consulted for Vancomycin/Cefepime dosing. Possible source UTI?  Plan: Cefepime 2g q12h changed to Ceftriaxone 2g q24 h on 5/22 Metronidazole 500mg  q8h continuing  Continue vancomycin to 750 mg IV q12h.   Will schedule Vancomycin trough for tomorrow if vancomycin continues - have discussed with ID pharmacist.  Continue to monitor renal function and plan for antibiotic therapy.  Scr 2.14 > 1.64 > 1.31> 0.99 >0.92 >0.96  Height: 5\' 4"  (162.6 cm) Weight: 84.4 kg (186 lb 1.1 oz) IBW/kg (Calculated) : 54.7  Temp (24hrs), Avg:99.3 F (37.4 C), Min:97.9 F (36.6 C), Max:101.5 F (38.6 C)  Recent Labs  Lab 08/21/19 2317 08/21/19 2317 08/22/19 0109 08/22/19 0355 08/22/19 0640 08/23/19 0529 08/24/19 0551 08/25/19 0607 08/26/19 0340  WBC 11.8*   < >  --  8.7  --  9.7 10.3 10.0 10.2  CREATININE 2.14*   < >  --  1.64*  --  1.31* 0.99 0.92 0.96  LATICACIDVEN 4.6*  --  3.2* 2.3* 1.9  --   --   --   --    < > = values in this interval not displayed.    Estimated Creatinine Clearance: 59.8 mL/min (by C-G formula based on SCr of 0.96 mg/dL).    Allergies  Allergen Reactions  . Naprosyn [Naproxen] Hives  . Shellfish Allergy Hives  . Sulfa Antibiotics Hives    Antimicrobials this admission: Cefepime 5/20 >> Vancomycin 5/20 >> Metronidazole 5/20 x 1  Dose adjustments this admission: N/A  Microbiology results: 5/19 BCx: NGTD 5/19 UCx: 40,000 E.faecium (vanc sensitive)  COVID NEG MRSA PCR: negative  Thank you for allowing pharmacy to be a part of this patient's care.  6/19, PharmD Clinical Pharmacist 08/26/2019 8:45 AM

## 2019-08-26 NOTE — Progress Notes (Signed)
ACTH lab ordered. Lab called and notified my this lab can only be drawn from 8-10am. Lab placed new order for draw on 08-27-19 AM.

## 2019-08-26 NOTE — Care Management Important Message (Signed)
Important Message  Patient Details  Name: ANYIAH COVERDALE MRN: [id number] Date of Birth: 1950/08/28   Medicare Important Message Given:  Yes     [name] 08/26/2019, 12:27 PM

## 2019-08-26 NOTE — Evaluation (Signed)
Clinical/Bedside Swallow Evaluation Patient Details  Name: Morgan Carpenter MRN: [id number] Date of Birth: 09/08/1950  Today's Date: 08/26/2019 Time: SLP Start Time (ACUTE ONLY): 1235 SLP Stop Time (ACUTE ONLY): 1335 SLP Time Calculation (min) (ACUTE ONLY): 60 min  Past Medical History:  Past Medical History:  Diagnosis Date  . Adrenal insufficiency (HCC)   . DDD (degenerative disc disease), cervical   . DDD (degenerative disc disease), lumbar   . Hemiplegia (HCC)   . Hypercholesteremia   . Hyperprolactinemia (HCC)   . Hypopituitarism (HCC)   . Lymphocytic hypophysitis (HCC)   . OSA (obstructive sleep apnea)   . Patient is Jehovah's Witness   . Recurrent deep vein thrombosis (DVT) (HCC)   . Sarcoidosis   . Stroke (HCC)   . Thyroid disease   . Vitamin D deficiency    Past Surgical History:  Past Surgical History:  Procedure Laterality Date  . CHOLECYSTECTOMY    . NOSE SURGERY     HPI:  Pt is a 69 y.o. female African-American female with a known history of Hypopituitarism, hemiplegia, DDD, DVT, sarcoidosis, obstructive sleep apnea, hypopituitarism on low dose Prednisone, dyslipidemia and CVA with residual right hemiparesis, who presented to the emergency room with acute onset of altered mental status with incoherence and confusion.  The patient was diagnosed with UTI.  Patient febrile with elevated white blood cell count, procalcitonin and evidence of renal insufficiency above baseline. She resides at [facility]. She was admitted w/ "more confused and altered than her baseline" per chart note.    Assessment / Plan / Recommendation Clinical Impression  Pt appears to present w/ primary Mental Status/Cognitive decline; pt has a dx of altered mental status with incoherence and confusion per MD notes. Pt is currently dependent for ADLs - icnluding feeding at this evaluation. With any decreased mental status and/or decreased oral phase awareness, pt is at increased risk for  oropharyngeal phase swallowing deficits which can increase risk for aspiration to occur. Aspiration precautions and Supervision w/ oral intake/Feeding support given -- this, along w/ modification of diet consistency, appeared to reduce her risk for aspiration. However, during trials, pt exhibited such decreased awarness, she required verbal/visual and intermittently tactile cues of spoon at lips to elicit mouth opening and bolus acceptance. Trial consistencies were limited to Nectar liquids and purees, ice chips. No overt clinical s/s of aspiration noted during/post trials given both via spoon/cup w/ liquids, tsps of purees. No overt coughing or throat clearing was noted; no decline in respiratory presentation during/post trials. Oral phase deficits noted impacted by Cognitive decline. Pt exhibited oral prep and oral acceptance deficits c/b turning of head, hesitation and slow acceptance of boluses. Once po's were accepted, min slower bolus management was followed by timely A-P transfer and swallowing w/ trials given. Oral clearing was achieved w/ boluses given. OM exam was limited d/t Cognitive status; no unilateral weakness was apparent during bolus acceptance/management and no anterior leakage ocurred w/ trials. Mod-Max verbal/tactile cues given overall in order to promote and support oral intake -- this increases risk for aspiration overall. Due to pt's presentation at this time, recommend a Dysphagia level 1 (puree) diet w/ Nectar liquids w/ Aspiration precautions; reduce distractions during meals. Feeding Support at meals. Pills in Puree -- Crushed for easier swallowing. ST services will continue to f/u w/ pt's status and monitor toleration of diet/po's for possible upgrade of diet consistency. MD and NSG updated. Recommend Dietician f/u for support.  SLP Visit Diagnosis: Dysphagia, oropharyngeal phase (  R13.12)(mental status decline)    Aspiration Risk  Mild aspiration risk;Moderate aspiration risk;Risk  for inadequate nutrition/hydration    Diet Recommendation  Dysphagia level 1 (PUREE) w/ NECTAR consistency liquids; aspiration precautions; feeding support at meals w/ verbal/visual cues - pt must be engaged and alert to tasks  Medication Administration: Crushed with puree(for safer swallowing)    Other  Recommendations Recommended Consults: (Dietician f/u for support) Oral Care Recommendations: Oral care BID;Oral care before and after PO;Staff/trained caregiver to provide oral care Other Recommendations: Order thickener from pharmacy;Remove water pitcher;Prohibited food (jello, ice cream, thin soups);Have oral suction available   Follow up Recommendations (TBD)      Frequency and Duration min 3x week  2 weeks       Prognosis Prognosis for Safe Diet Advancement: Fair(-Good) Barriers to Reach Goals: Time post onset;Severity of deficits;Behavior(mental status change)      Swallow Study   General Date of Onset: 08/21/19 HPI: Pt is a 69 y.o. female African-American female with a known history of Hypopituitarism, hemiplegia, DDD, DVT, sarcoidosis, obstructive sleep apnea, hypopituitarism on low dose Prednisone, dyslipidemia and CVA with residual right hemiparesis, who presented to the emergency room with acute onset of altered mental status with incoherence and confusion.  The patient was diagnosed with UTI.  Patient febrile with elevated white blood cell count, procalcitonin and evidence of renal insufficiency above baseline. She resides at [facility]. She was admitted w/ "more confused and altered than her baseline" per chart note.  Type of Study: Bedside Swallow Evaluation Previous Swallow Assessment: none Diet Prior to this Study: Regular;Thin liquids Temperature Spikes Noted: Yes(wbc 10.2) Respiratory Status: Room air History of Recent Intubation: No Behavior/Cognition: Confused;Distractible;Requires cueing(drowsy at first) Oral Cavity Assessment: Dry(unable to assess fully  d/t Cognitive decline) Oral Care Completed by SLP: Yes(attempted ) Oral Cavity - Dentition: Adequate natural dentition Vision: (n/a) Self-Feeding Abilities: Total assist Patient Positioning: Upright in bed(needed full positioning upright in bed) Baseline Vocal Quality: (nonverbal) Volitional Cough: Cognitively unable to elicit Volitional Swallow: Unable to elicit    Oral/Motor/Sensory Function Overall Oral Motor/Sensory Function: (unable to fully assess d/t Cognitive decline; ok w/ po)   Ice Chips Ice chips: Impaired Presentation: Spoon(fed; 3 trials) Oral Phase Impairments: Reduced lingual movement/coordination;Poor awareness of bolus Oral Phase Functional Implications: Prolonged oral transit Pharyngeal Phase Impairments: (none)   Thin Liquid Thin Liquid: Not tested Other Comments: d/t Cognitive decline    Nectar Thick Nectar Thick Liquid: Impaired Presentation: Cup;Spoon;Straw(15 trials+) Oral Phase Impairments: Poor awareness of bolus Oral phase functional implications: (none) Pharyngeal Phase Impairments: (none) Other Comments: verbal/visual cues   Honey Thick Honey Thick Liquid: Not tested   Puree Puree: Impaired Presentation: Spoon(fed; 15 trials+) Oral Phase Impairments: Poor awareness of bolus Oral Phase Functional Implications: (none) Pharyngeal Phase Impairments: (none)   Solid     Solid: Not tested Other Comments: d/t Cognitive decline       Orinda Kenner, MS, CCC-SLP Wynton Hufstetler 08/26/2019,2:50 PM

## 2019-08-27 DIAGNOSIS — E272 Addisonian crisis: Secondary | ICD-10-CM

## 2019-08-27 DIAGNOSIS — N3000 Acute cystitis without hematuria: Secondary | ICD-10-CM

## 2019-08-27 LAB — CBC WITH DIFFERENTIAL/PLATELET
Abs Immature Granulocytes: 0.08 10*3/uL — ABNORMAL HIGH (ref 0.00–0.07)
Basophils Absolute: 0.1 10*3/uL (ref 0.0–0.1)
Basophils Relative: 0 %
Eosinophils Absolute: 0 10*3/uL (ref 0.0–0.5)
Eosinophils Relative: 0 %
HCT: 30.6 % — ABNORMAL LOW (ref 36.0–46.0)
Hemoglobin: 9.7 g/dL — ABNORMAL LOW (ref 12.0–15.0)
Immature Granulocytes: 1 %
Lymphocytes Relative: 12 %
Lymphs Abs: 1.7 10*3/uL (ref 0.7–4.0)
MCH: 27.6 pg (ref 26.0–34.0)
MCHC: 31.7 g/dL (ref 30.0–36.0)
MCV: 86.9 fL (ref 80.0–100.0)
Monocytes Absolute: 0.4 10*3/uL (ref 0.1–1.0)
Monocytes Relative: 3 %
Neutro Abs: 12.2 10*3/uL — ABNORMAL HIGH (ref 1.7–7.7)
Neutrophils Relative %: 84 %
Platelets: 523 10*3/uL — ABNORMAL HIGH (ref 150–400)
RBC: 3.52 MIL/uL — ABNORMAL LOW (ref 3.87–5.11)
RDW: 18 % — ABNORMAL HIGH (ref 11.5–15.5)
WBC: 14.4 10*3/uL — ABNORMAL HIGH (ref 4.0–10.5)
nRBC: 0.2 % (ref 0.0–0.2)

## 2019-08-27 LAB — BASIC METABOLIC PANEL
Anion gap: 7 (ref 5–15)
BUN: 12 mg/dL (ref 8–23)
CO2: 22 mmol/L (ref 22–32)
Calcium: 8.2 mg/dL — ABNORMAL LOW (ref 8.9–10.3)
Chloride: 123 mmol/L — ABNORMAL HIGH (ref 98–111)
Creatinine, Ser: 0.79 mg/dL (ref 0.44–1.00)
GFR calc Af Amer: >60 mL/min (ref >60)
GFR calc non Af Amer: >60 mL/min (ref >60)
Glucose, Bld: 146 mg/dL — ABNORMAL HIGH (ref 70–99)
Potassium: 3.6 mmol/L (ref 3.5–5.1)
Sodium: 152 mmol/L — ABNORMAL HIGH (ref 135–145)

## 2019-08-27 LAB — PROCALCITONIN: Procalcitonin: 1 ng/mL

## 2019-08-27 LAB — CULTURE, BLOOD (ROUTINE X 2)
Culture: NO GROWTH
Culture: NO GROWTH

## 2019-08-27 LAB — PROTIME-INR
INR: 2.6 — ABNORMAL HIGH (ref 0.8–1.2)
Prothrombin Time: 27.1 seconds — ABNORMAL HIGH (ref 11.4–15.2)

## 2019-08-27 LAB — VANCOMYCIN, TROUGH: Vancomycin Tr: 19 ug/mL (ref 15–20)

## 2019-08-27 MED ORDER — VANCOMYCIN HCL 750 MG/150ML IV SOLN
750.0000 mg | Freq: Two times a day (BID) | INTRAVENOUS | Status: AC
  Administered 2019-08-27 – 2019-08-28 (×3): 750 mg via INTRAVENOUS
  Filled 2019-08-27 (×3): qty 150

## 2019-08-27 MED ORDER — VANCOMYCIN HCL IN DEXTROSE 1-5 GM/200ML-% IV SOLN
1000.0000 mg | Freq: Two times a day (BID) | INTRAVENOUS | Status: DC
  Filled 2019-08-27: qty 200

## 2019-08-27 MED ORDER — PHYTONADIONE 5 MG PO TABS
5.0000 mg | ORAL_TABLET | Freq: Once | ORAL | Status: AC
  Administered 2019-08-27: 5 mg via ORAL
  Filled 2019-08-27: qty 1

## 2019-08-27 NOTE — Progress Notes (Addendum)
Date of Admission:  08/21/2019   T  Subjective: Pt more awake and alert Responds to commands appropriately Has expressive dysphasia- struggling to find words and gets frustrated but can understand very well  Medications:  . atorvastatin  40 mg Oral Daily  . FLUoxetine  10 mg Oral Daily  . hydrocortisone sod succinate (SOLU-CORTEF) inj  50 mg Intravenous Q6H  . levothyroxine  75 mcg Oral Q0600  . multivitamin with minerals  1 tablet Oral Daily  . pantoprazole  40 mg Oral Daily    Objective: Vital signs in last 24 hours: Temp:  [97.6 F (36.4 C)-100 F (37.8 C)] 97.6 F (36.4 C) (05/25 1125) Pulse Rate:  [96-104] 102 (05/25 1125) Resp:  [17-20] 17 (05/25 1125) BP: (106-134)/(63-79) 119/63 (05/25 1125) SpO2:  [98 %-100 %] 100 % (05/25 1125)  PHYSICAL EXAM:  General: Alert, cooperative, no distress, appears stated age.  Head: Normocephalic, without obvious abnormality, atraumatic. Eyes: Conjunctivae clear, anicteric sclerae. Pupils are equal ENT Nares normal. No drainage or sinus tenderness. Lips, mucosa, and tongue normal. No Thrush Neck: Supple, symmetrical, no adenopathy, thyroid: non tender no carotid bruit and no JVD. Back: No CVA tenderness. Lungs: Clear to auscultation bilaterally. No Wheezing or Rhonchi. No rales. Heart: Regular rate and rhythm, no murmur, rub or gallop. Abdomen: Soft, non-tender,not distended. Bowel sounds normal. No masses Extremities:moves upper extremities better than lower  Edema legs Skin: No rashes or lesions. Or bruising Lymph: Cervical, supraclavicular normal. Neurologic: expressive dysphasia- lower extremity movement less  Lab Results Recent Labs    08/26/19 0340 08/27/19 0716  WBC 10.2 14.4*  HGB 9.7* 9.7*  HCT 29.5* 30.6*  NA 155* 152*  K 2.9* 3.6  CL 125* 123*  CO2 22 22  BUN 9 12  CREATININE 0.96 0.79   Liver Panel Recent Labs    08/26/19 0340  PROT 5.3*  ALBUMIN 2.1*  AST 76*  ALT 45*  ALKPHOS 61  BILITOT 0.7   BILIDIR 0.1  IBILI 0.6   Sedimentation Rate No results for input(s): ESRSEDRATE in the last 72 hours. C-Reactive Protein No results for input(s): CRP in the last 72 hours.  Microbiology:  Studies/Results: EEG  Result Date: 08/26/2019 Thana Farr, MD     08/26/2019  7:05 PM ELECTROENCEPHALOGRAM REPORT Patient: Carpenter Carpenter       Room #: 250A-AA EEG No. ID: [id number] Age: 69 y.o.        Sex: female Requesting Physician: Georgeann Oppenheim Report Date:  08/26/2019       Interpreting Physician: Thana Farr History: Carpenter MORGA is an 69 y.o. female with altered mental status Medications: Lipitor, Rocephin, Prozac, Synthroid, Vancomycin Conditions of Recording:  This is a 21 channel routine scalp EEG performed with bipolar and monopolar montages arranged in accordance to the international 10/20 system of electrode placement. One channel was dedicated to EKG recording. The patient is in the altered state. Description:  Artifact is prominent during the recording often obscuring the background rhythm. When able to be visualized the background consists of poorly organized beta activity that is diffusely distributed.  This activity is continuous.  No epileptiform activity is noted.  Hyperventilation was not performed.  Intermittent photic stimulation was performed but failed to illicit any change in the tracing. IMPRESSION: This electroencephalogram consists of continuous beta activity of unclear significance.  No epileptiform activity is noted.  Thana Farr, MD Neurology [phone] 08/26/2019, 6:55 PM   CT ABDOMEN PELVIS W CONTRAST  Result Date: 08/26/2019 CLINICAL DATA:  Clinical suspicion for intra-abdominal infectious focus. EXAM: CT ABDOMEN AND PELVIS WITH CONTRAST TECHNIQUE: Multidetector CT imaging of the abdomen and pelvis was performed using the standard protocol following bolus administration of intravenous contrast. CONTRAST:  122mL OMNIPAQUE IOHEXOL 300 MG/ML  SOLN COMPARISON:  None. FINDINGS:  Lower chest: Bibasilar atelectasis but no definite infiltrates or effusions. The heart is normal in size. Hepatobiliary: Diffuse fatty infiltration of the liver. No focal hepatic lesions or intrahepatic biliary dilatation. Gallbladder is surgically absent. Pancreas: No mass, inflammation or ductal dilatation. Spleen: Normal size. No focal lesions. Adrenals/Urinary Tract: Small left adrenal gland nodule, likely benign adenoma. The right adrenal gland is normal. No renal lesions or definite findings for pyelonephritis or renal abscess. No renal or obstructing ureteral calculi or bladder calculi. There is a duplicated right collecting system and 2 ureters deep into the pelvis. The bladder is mildly distended. There is also mild asymmetric bladder wall thickening and surrounding interstitial changes in the perivesical fat. Findings suspicious for cystitis. No gas is seen in the bladder. Stomach/Bowel: The stomach, duodenum, small bowel and colon are grossly normal without oral contrast. No inflammatory changes, mass lesions or obstructive findings. The appendix is normal. Vascular/Lymphatic: Scattered aortic calcifications but no aneurysm or dissection. The branch vessels are patent. An IVC filter is noted. No mesenteric or retroperitoneal mass or adenopathy. Reproductive: The uterus and ovaries are unremarkable. Other: No pelvic mass or adenopathy. No free pelvic fluid collections. No inguinal mass or adenopathy. No abdominal wall hernia or subcutaneous lesions. Musculoskeletal: No significant bony findings. Moderate thoracolumbar scoliosis noted. IMPRESSION: 1. CT findings suspicious for acute cystitis. 2. No findings for pyelonephritis or renal abscess. 3. Duplicated right collecting system and 2 ureters deep into the pelvis. 4. Diffuse fatty infiltration of the liver. 5. Status post cholecystectomy. No biliary dilatation. 6. Aortic atherosclerosis. Aortic Atherosclerosis (ICD10-I70.0). Electronically Signed   By: Marijo Sanes M.D.   On: 08/26/2019 16:33   US Venous Img Lower Bilateral (DVT)  Result Date: 08/26/2019 CLINICAL DATA:  DVT EXAM: BILATERAL LOWER EXTREMITY VENOUS DOPPLER ULTRASOUND TECHNIQUE: Gray-scale sonography with compression, as well as color and duplex ultrasound, were performed to evaluate the deep venous system(s) from the level of the common femoral vein through the popliteal and proximal calf veins. COMPARISON:  None. FINDINGS: VENOUS Normal compressibility of the common femoral, superficial femoral, and popliteal veins, as well as the visualized calf veins. Visualized portions of profunda femoral vein and great saphenous vein unremarkable. No filling defects to suggest DVT on grayscale or color Doppler imaging. Doppler waveforms show normal direction of venous flow, normal respiratory plasticity and response to augmentation. OTHER None. Limitations: Body habitus. IMPRESSION: No evidence of lower extremity DVT. Electronically Signed   By: Rolm Baptise M.D.   On: 08/26/2019 11:14     Assessment/Plan: 69 y.o. 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 ? ?Encephalopathy with intermittent fever. Patient is currently being treated for UTI but has not seen much of a response with her mental status which is not at baseline. The prolonged encephalopathy /fever was lilkey due to secondary hypoadrenalism/ adrenal crisis and after starting stress dose steroids she has improved a lot. Still has expressive dysphasia CT abdomen showed acute cystitis, duplicated rt collecting system with  2 ureters Fatty infiltration of the liver  Patient currently on vancomycin, ceftriaxone(not  meningitic dose]  The Enterococcus  in the urine is sensitive to vancomycin but patient was continuing to have fever until steroids were started yesterday. We will get a bladder scan to look for residue History of mixed incontinence with overactive bladder with recurrent UTI. LP was requested yesterday but if her mental status which has improved a lot today returns to baseline tomorrow and no fever we may not need LP Reassess tomorrow  High procalcitonin much improved Normal cortisol level on 12/22/2019.  Recent Enterococcus UTI in the nursing home and she was on linezolid from 08/20/2019. Could she have  serotonin syndrome secondary to drug -drug interactions with fluoxetine tizanidine tramadol and linezolid  Abnormal INR and PTT.  On admission the INR was 7 and PTT was 58.4.  Unclear reason.  Patient was only on Xarelto.  Transaminitis on admission. ?  Medication?  Infection  Normal bilirubin and ammonia.  AKI on admission has resolved  Hypernatremia.    Hypokalemia  Secondary hypothyroidism on Synthroid.  Her normal dose of 75 mcg/day  TSH less than 0.10, ( low) T3 is 64 which is low and free T-4 is 0.91 ( normal).  Not clear how to interpret these results.  Could be from the acute illness??.  Endocrine consult   History of lymphocytic hypophysitis diagnosed in 2012 history of secondary adrenal insufficiency on chronic low-dose prednisone  History of secondary hypothyroidism on thyroxine History of neurosarcoid  History of cervical vertebrae neural foraminal stenosis History of lumbar stenosis  History of CVA in 2009 due to left MCA infarct secondary to left unruptured posterior communicating aneurysm needing coiling  History of optic atrophy because of pituitary lesion in 2012 ? _History of DVT on Xarelto   Discussed the management with the hospitalist and nurse

## 2019-08-27 NOTE — Progress Notes (Addendum)
Patient resting comfortably through night. More responsive and alert tonight than last night. Intelligible speech today and more interaction.

## 2019-08-27 NOTE — Progress Notes (Signed)
Subjective: Patient improved today.  Alert.  Follows some commands.  Ate some breakfast.  Makes attempts at speech.    Objective: Current vital signs: BP 130/63 (BP Location: Right Arm)   Pulse 96   Temp 98.5 F (36.9 C) (Oral)   Resp 17   Ht 5\' 4"  (1.626 m)   Wt 84.4 kg   SpO2 98%   BMI 31.94 kg/m  Vital signs in last 24 hours: Temp:  [98.5 F (36.9 C)-100.3 F (37.9 C)] 98.5 F (36.9 C) (05/25 0806) Pulse Rate:  [96-104] 96 (05/25 0806) Resp:  [17-20] 17 (05/25 0806) BP: (106-134)/(63-79) 130/63 (05/25 0806) SpO2:  [98 %] 98 % (05/25 0806)  Intake/Output from previous day: 05/24 0701 - 05/25 0700 In: 1367.7 [I.V.:698.8; IV Piggyback:669] Out: 650 [Urine:650] Intake/Output this shift: Total I/O In: 240 [P.O.:240] Out: -  Nutritional status:  Diet Order            DIET - DYS 1 Room service appropriate? Yes with Assist; Fluid consistency: Nectar Thick  Diet effective now              Neurologic Exam: Mental Status: Alert and awake.  Follows some simple commands.  Nods head and responds yes and no to questions.   Cranial Nerves: III,IV, VI: extra-ocular motions intact bilaterally V,VII: smile symmetric VIII: hearing normal bilaterally IX,X: gag reflex present XI: bilateral shoulder shrug XII: midline tongue extension Motor: Lifts both upper extremities against gravity, left greater than right.     Lab Results: Basic Metabolic Panel: Recent Labs  Lab 08/23/19 0529 08/23/19 0529 08/24/19 0551 08/24/19 0551 08/25/19 0607 08/26/19 0340 08/27/19 0716  NA 144  --  148*  --  154* 155* 152*  K 3.8  --  3.4*  --  3.5 2.9* 3.6  CL 111  --  116*  --  124* 125* 123*  CO2 22  --  22  --  22 22 22   GLUCOSE 86  --  95  --  96 83 146*  BUN 17  --  14  --  12 9 12   CREATININE 1.31*  --  0.99  --  0.92 0.96 0.79  CALCIUM 8.3*   < > 8.5*   < > 8.7* 8.2* 8.2*   < > = values in this interval not displayed.    Liver Function Tests: Recent Labs  Lab  08/21/19 2317 08/22/19 0355 08/26/19 0340  AST 141* 119* 76*  ALT 43 37 45*  ALKPHOS 93 75 61  BILITOT 1.0 1.0 0.7  PROT 6.5 5.4* 5.3*  ALBUMIN 2.8* 2.3* 2.1*   No results for input(s): LIPASE, AMYLASE in the last 168 hours. Recent Labs  Lab 08/24/19 1411  AMMONIA 11    CBC: Recent Labs  Lab 08/23/19 0529 08/24/19 0551 08/25/19 0607 08/26/19 0340 08/27/19 0716  WBC 9.7 10.3 10.0 10.2 14.4*  NEUTROABS 5.0 6.5 6.5 5.1 12.2*  HGB 10.4* 9.6* 10.1* 9.7* 9.7*  HCT 32.5* 30.5* 30.8* 29.5* 30.6*  MCV 87.1 87.4 84.6 84.3 86.9  PLT 622* 511* 486* 484* 523*    Cardiac Enzymes: No results for input(s): CKTOTAL, CKMB, CKMBINDEX, TROPONINI in the last 168 hours.  Lipid Panel: No results for input(s): CHOL, TRIG, HDL, CHOLHDL, VLDL, LDLCALC in the last 168 hours.  CBG: No results for input(s): GLUCAP in the last 168 hours.  Microbiology: Results for orders placed or performed during the hospital encounter of 08/21/19  Blood culture (routine x 2)  Status: None   Collection Time: 08/21/19 11:24 PM   Specimen: Right Antecubital; Blood  Result Value Ref Range Status   Specimen Description RIGHT ANTECUBITAL  Final   Special Requests   Final    BOTTLES DRAWN AEROBIC AND ANAEROBIC Blood Culture results may not be optimal due to an excessive volume of blood received in culture bottles   Culture   Final    NO GROWTH 5 DAYS Performed at [facility], [address] [place], [place] [address]    Report Status 08/27/2019 FINAL  Final  Blood culture (routine x 2)     Status: None   Collection Time: 08/21/19 11:29 PM   Specimen: BLOOD RIGHT WRIST  Result Value Ref Range Status   Specimen Description BLOOD RIGHT WRIST  Final   Special Requests   Final    BOTTLES DRAWN AEROBIC AND ANAEROBIC Blood Culture results may not be optimal due to an excessive volume of blood received in culture bottles   Culture   Final    NO GROWTH 5 DAYS Performed at [facility],  [address], [place], [place] [address]    Report Status 08/27/2019 FINAL  Final  Urine Culture     Status: Abnormal   Collection Time: 08/22/19 12:46 AM   Specimen: Urine, Catheterized  Result Value Ref Range Status   Specimen Description   Final    URINE, CATHETERIZED Performed at [facility], [address] [place], [place] [address]    Special Requests   Final    NONE Performed at [facility], [address] [place], [place] [address]    Culture 40,000 COLONIES/mL ENTEROCOCCUS FAECIUM (A)  Final   Report Status 08/24/2019 FINAL  Final   Organism ID, Bacteria ENTEROCOCCUS FAECIUM (A)  Final      Susceptibility   Enterococcus faecium - MIC*    AMPICILLIN >=32 RESISTANT Resistant     NITROFURANTOIN 128 RESISTANT Resistant     VANCOMYCIN <=0.5 SENSITIVE Sensitive     * 40,000 COLONIES/mL ENTEROCOCCUS FAECIUM  SARS Coronavirus 2 by RT PCR (hospital order, performed in [facility] hospital lab) Nasopharyngeal Nasopharyngeal Swab     Status: None   Collection Time: 08/22/19  1:09 AM   Specimen: Nasopharyngeal Swab  Result Value Ref Range Status   SARS Coronavirus 2 NEGATIVE NEGATIVE Final    Comment: (NOTE) SARS-CoV-2 target nucleic acids are NOT DETECTED. The SARS-CoV-2 RNA is generally detectable in upper and lower respiratory specimens during the acute phase of infection. The lowest concentration of SARS-CoV-2 viral copies this assay can detect is 250 copies / mL. A negative result does not preclude SARS-CoV-2 infection and should not be used as the sole basis for treatment or other patient management decisions.  A negative result may occur with improper specimen collection / handling, submission of specimen other than nasopharyngeal swab, presence of viral mutation(s) within the areas targeted by this assay, and inadequate number of viral copies (<250 copies / mL). A negative result must be combined with clinical observations, patient history, and  epidemiological information. Fact Sheet for Patients:   BoilerBrush.com.cy Fact Sheet for Healthcare Providers: https://pope.com/ This test is not yet approved or cleared  by the [place] FDA and has been authorized for detection and/or diagnosis of SARS-CoV-2 by FDA under an Emergency Use Authorization (EUA).  This EUA will remain in effect (meaning this test can be used) for the duration of the COVID-19 declaration under Section 564(b)(1) of the Act, 21  U.S.C. section 360bbb-3(b)(1), unless the authorization is terminated or revoked sooner. Performed at [facility], [address] [place], [place] [address]   MRSA PCR Screening     Status: None   Collection Time: 08/23/19  2:45 AM   Specimen: Nasopharyngeal  Result Value Ref Range Status   MRSA by PCR NEGATIVE NEGATIVE Final    Comment:        The GeneXpert MRSA Assay (FDA approved for NASAL specimens only), is one component of a comprehensive MRSA colonization surveillance program. It is not intended to diagnose MRSA infection nor to guide or monitor treatment for MRSA infections. Performed at [facility], [address] [place], [place] [address]     Coagulation Studies: Recent Labs    08/25/19 9735 08/26/19 0340 08/27/19 0716  LABPROT 27.0* 32.0* 27.1*  INR 2.6* 3.2* 2.6*    Imaging: EEG  Result Date: 08/26/2019 Thana Farr, MD     08/26/2019  7:05 PM ELECTROENCEPHALOGRAM REPORT Patient: Morgan Carpenter       Room #: 250A-AA EEG No. ID: [id number] Age: 70 y.o.        Sex: female Requesting Physician: Georgeann Oppenheim Report Date:  08/26/2019       Interpreting Physician: Thana Farr History: CHANTEA SURACE is an 69 y.o. female with altered mental status Medications: Lipitor, Rocephin, Prozac, Synthroid, Vancomycin Conditions of Recording:  This is a 21 channel routine scalp EEG performed with bipolar and monopolar montages arranged in accordance  to the international 10/20 system of electrode placement. One channel was dedicated to EKG recording. The patient is in the altered state. Description:  Artifact is prominent during the recording often obscuring the background rhythm. When able to be visualized the background consists of poorly organized beta activity that is diffusely distributed.  This activity is continuous.  No epileptiform activity is noted.  Hyperventilation was not performed.  Intermittent photic stimulation was performed but failed to illicit any change in the tracing. IMPRESSION: This electroencephalogram consists of continuous beta activity of unclear significance.  No epileptiform activity is noted.  Thana Farr, MD Neurology [phone] 08/26/2019, 6:55 PM   CT ABDOMEN PELVIS W CONTRAST  Result Date: 08/26/2019 CLINICAL DATA:  Clinical suspicion for intra-abdominal infectious focus. EXAM: CT ABDOMEN AND PELVIS WITH CONTRAST TECHNIQUE: Multidetector CT imaging of the abdomen and pelvis was performed using the standard protocol following bolus administration of intravenous contrast. CONTRAST:  OMNIPAQUE IOHEXOL 300 MG/ML  SOLN COMPARISON:  None. FINDINGS: Lower chest: Bibasilar atelectasis but no definite infiltrates or effusions. The heart is normal in size. Hepatobiliary: Diffuse fatty infiltration of the liver. No focal hepatic lesions or intrahepatic biliary dilatation. Gallbladder is surgically absent. Pancreas: No mass, inflammation or ductal dilatation. Spleen: Normal size. No focal lesions. Adrenals/Urinary Tract: Small left adrenal gland nodule, likely benign adenoma. The right adrenal gland is normal. No renal lesions or definite findings for pyelonephritis or renal abscess. No renal or obstructing ureteral calculi or bladder calculi. There is a duplicated right collecting system and 2 ureters deep into the pelvis. The bladder is mildly distended. There is also mild asymmetric bladder wall thickening and surrounding  interstitial changes in the perivesical fat. Findings suspicious for cystitis. No gas is seen in the bladder. Stomach/Bowel: The stomach, duodenum, small bowel and colon are grossly normal without oral contrast. No inflammatory changes, mass lesions or obstructive findings. The appendix is normal. Vascular/Lymphatic: Scattered aortic calcifications but no aneurysm or dissection. The branch vessels are patent. An IVC filter  is noted. No mesenteric or retroperitoneal mass or adenopathy. Reproductive: The uterus and ovaries are unremarkable. Other: No pelvic mass or adenopathy. No free pelvic fluid collections. No inguinal mass or adenopathy. No abdominal wall hernia or subcutaneous lesions. Musculoskeletal: No significant bony findings. Moderate thoracolumbar scoliosis noted. IMPRESSION: 1. CT findings suspicious for acute cystitis. 2. No findings for pyelonephritis or renal abscess. 3. Duplicated right collecting system and 2 ureters deep into the pelvis. 4. Diffuse fatty infiltration of the liver. 5. Status post cholecystectomy. No biliary dilatation. 6. Aortic atherosclerosis. Aortic Atherosclerosis (ICD10-I70.0). Electronically Signed   By: Marijo Sanes M.D.   On: 08/26/2019 16:33   US Venous Img Lower Bilateral (DVT)  Result Date: 08/26/2019 CLINICAL DATA:  DVT EXAM: BILATERAL LOWER EXTREMITY VENOUS DOPPLER ULTRASOUND TECHNIQUE: Gray-scale sonography with compression, as well as color and duplex ultrasound, were performed to evaluate the deep venous system(s) from the level of the common femoral vein through the popliteal and proximal calf veins. COMPARISON:  None. FINDINGS: VENOUS Normal compressibility of the common femoral, superficial femoral, and popliteal veins, as well as the visualized calf veins. Visualized portions of profunda femoral vein and great saphenous vein unremarkable. No filling defects to suggest DVT on grayscale or color Doppler imaging. Doppler waveforms show normal direction of venous  flow, normal respiratory plasticity and response to augmentation. OTHER None. Limitations: Body habitus. IMPRESSION: No evidence of lower extremity DVT. Electronically Signed   By: Rolm Baptise M.D.   On: 08/26/2019 11:14    Medications:  I have reviewed the patient's current medications. Scheduled: . atorvastatin  40 mg Oral Daily  . FLUoxetine  10 mg Oral Daily  . hydrocortisone sod succinate (SOLU-CORTEF) inj  50 mg Intravenous Q6H  . levothyroxine  75 mcg Oral Q0600  . multivitamin with minerals  1 tablet Oral Daily  . pantoprazole  40 mg Oral Daily  . phytonadione  5 mg Oral Once    Assessment/Plan: 69 y.o.femaleAfrican-American femalewith a known history of sarcoidosis, obstructive sleep apnea, hypopituitarismon low dose Prednisone, dyslipidemia and CVAwith residual right hemiparesis, who presented to the emergency room with acute onset of altered mental status with incoherence and confusion. The patient was diagnosed with UTI. Patient febrile with elevated white blood cell count, procalcitonin and evidence of renal insufficiency above baseline. Started on broad spectrum antibiotics to include Cefepime, Vancomycin and Flagyl. There initially was some improvement in her fever, white blood cell count and renal insufficiency. LFT's were elevated as well. Also improving. Mental status continues to slowly improve but has not returned to baseline and continues to be febrile. MRI of the brain personally reviewed and shows no acute changes. EEG findings nonspecific.   ID has evaluated patient and feel that LP is indicated.  Still unable to perform due to elevated INR (2.6 today).    Plan: Case discussed with Dr. Priscella Mann.  Will continue to follow with you.    LOS: 5 days   Alexis Goodell, MD Neurology [phone] 08/27/2019  10:51 AM

## 2019-08-27 NOTE — Consult Note (Signed)
Pharmacy Antibiotic Note  Morgan Carpenter is a 69 y.o. female admitted on 08/21/2019 with sepsis.  Pharmacy has been consulted for Vancomycin dosing.   Plan: Current orders for Vancomycin 750 mg IV q12h.  Trough before 7th dose of regimen = 19 mcg/ml, within goal range of 15-20 mcg/ml Continue current dose. If renal function worsens, may need to adjust dose.   Continue to monitor renal function closely and plan for antibiotic therapy.  Scr 2.14 > 1.64 > 1.31> 0.99 >0.92 >0.96>0.79  Height: 5\' 4"  (162.6 cm) Weight: 84.4 kg (186 lb 1.1 oz) IBW/kg (Calculated) : 54.7  Temp (24hrs), Avg:98.5 F (36.9 C), Min:97.6 F (36.4 C), Max:99.8 F (37.7 C)  Recent Labs  Lab 08/21/19 2317 08/21/19 2317 08/22/19 0109 08/22/19 0355 08/22/19 0355 08/22/19 0640 08/23/19 0529 08/24/19 0551 08/25/19 0607 08/26/19 0340 08/27/19 0716 08/27/19 1647  WBC 11.8*   < >  --  8.7   < >  --  9.7 10.3 10.0 10.2 14.4*  --   CREATININE 2.14*   < >  --  1.64*   < >  --  1.31* 0.99 0.92 0.96 0.79  --   LATICACIDVEN 4.6*  --  3.2* 2.3*  --  1.9  --   --   --   --   --   --   VANCOTROUGH  --   --   --   --   --   --   --   --   --   --   --  19   < > = values in this interval not displayed.    Estimated Creatinine Clearance: 71.7 mL/min (by C-G formula based on SCr of 0.79 mg/dL).    Allergies  Allergen Reactions  . Naprosyn [Naproxen] Hives  . Shellfish Allergy Hives  . Sulfa Antibiotics Hives    Antimicrobials this admission: Cefepime 5/20 >>5/22 Ceftriaxone 5/22 >> Vancomycin 5/20 >> Metronidazole 5/20 x 1, 5/22>>5/24  Dose adjustments this admission: N/A  Microbiology results: 5/19 BCx: NGF 5/19 UCx: 40,000 E. faecium (vanc sensitive)  COVID NEG MRSA PCR: negative  Thank you for allowing pharmacy to be a part of this patient's care.  6/19, PharmD, BCPS Clinical Pharmacist 08/27/2019 8:12 PM

## 2019-08-27 NOTE — Progress Notes (Addendum)
PROGRESS NOTE    Morgan Carpenter  DDU:[id number] DOB: 08/22/1950 DOA: 08/21/2019 PCP: Michail Sermon, MD  Brief Narrative:  69 year old female has had multiple readmissions recently.  She has a resident at short-term rehab at [facility] and was discharged after recent hospital stay.  Patient is unable to provide history so the majority the history was gained by speaking with the patient's husband at bedside.  He is increasingly concerned with her declining level of function.  We spent some time talking about her spinal stenosis.  She does have lumbar spinal stenosis and is being evaluated for an outpatient procedure through physician at [facility].  There was a concern for cervical stenosis however MRI performed on this admission did not support that.  Patient had significantly elevated procalcitonin and INR on admission.  Exact etiology of these are unclear.  Treating for presumptive sepsis with urinary source.  Blood and urine cultures have been taken and are pending.  Will maintain broad-spectrum antibiotics on board until that time.  5/21: Patient remains significantly altered encephalopathic.  She responds to her name but remains relatively nonverbal and intermittently crying out.  Procalcitonin downtrending.  All cultures remain negative.  No fevers over interval.  Consulted neurology, recommendations appreciated.  Exact etiology of encephalopathy is unclear.  Could be due to polypharmacy but difficult to distinguish in the setting of acute infection.   5/22: Patient remained hemodynamically stable but significantly encephalopathic.  Cold up in bed.  Appears uncomfortable.  Does respond to name however unable to follow commands or answer any questions.  Procalcitonin and metabolic markers have been improving.  Urine culture positive for Enterococcus faecalis, sensitive to vancomycin.  Low-grade fever noted over interval T-max 100.9.  Case discussed with neurology consult Dr. Thad Ranger.  Recommendations  greatly appreciated.  Continue to suspect metabolic origin of patient's encephalopathy.  5/23: Remains encephalopathic.  Appears less agitated than she was yesterday.  Does respond to her name however unable to hold a conversation.  Unintelligible speech.  Seen by neurology.  Recommending EEG.  TSH very low.  Could be related to underlying endocrine disorder.  5/24: Patient seen and examined.  Remains encephalopathic.  Less agitated.  Does respond to her name but unable to hold a conversation.  Unintelligible speech.  Seen by neurology this morning.  Also consulted ID.  5/25: Patient seen and examined.  Starting to wake up a little bit today.  Remains somewhat lethargic and encephalopathic but seems to have greater understanding of interview questions and a little bit more lucid.  Still having fevers however.  Discussed with neurology at length this morning.  ID on board.  Patient still needs a lumbar puncture considering persistent fevers of unknown etiology.  Still coagulopathic.  Xarelto on hold.  Received 5 mg p.o. vitamin K yesterday.  Discussed with interventional radiology as well.  They are agreeable to do image guided LP however required the INR to be 1.5 or less.   Assessment & Plan:   Active Problems:   Severe sepsis (HCC)   Pressure injury of skin  Acute toxic encephalopathy Unclear etiology At baseline patient is apparently communicative and expressive Husband has noticed a deterioration in mentation and overall level of health in the past month At this time encephalopathy could be infection driven versus polypharmacy CT head unrevealing MRI brain unrevealing for acute infarct.  Case and images reviewed by neurology ??  Manifestation of underlying lymphocytic hypophysitis.  Could be atypical manifestation of adrenal insufficiency --Started on stress dose  hydrocortisone on 08/26/2019.  Mental status appears to be improving at this time -Cryptococcal antigen negative --CT abdomen  pelvis with contrast unrevealing for intra-abdominal septic focus Plan: Continue vancomycin Continue Rocephin Flagyl stopped per ID Continue hydrocortisone 50 mg every 6 hours Avoid sedatives or anticholinergics Frequent reorienting measures Delirium precautions We will pursue lumbar puncture.  Patient is coagulopathic at this time.  INR 2.6 today.  Communicated with neurology as well as interventional radiology.  IR willing to do image guided lumbar puncture however require INR to be less than 1.5.  Will administer additional 5 mg p.o. vitamin K today.  Recheck INR in the morning.  If INR less than 1.5 this meets IR threshold.  If INR remains elevated consider either FFP or IV vitamin K.  Update: Case was discussed with infectious disease this afternoon.  Patient is improved clinically.  Following commands but still aphasic and with word finding difficulties.  It is possible that if the patient continues to improve she may not warrant lumbar puncture.  Will evaluate patient in the morning.  Reach out to infectious disease for further guidance regarding need for lumbar puncture.  Coagulopathy History of PE s/p IVC filter Patient has been on Xarelto for extended period of time She has an IVC filter in place She came in markedly coagulopathic with INR greater than 7 Xarelto has been held and INR down to 2.6 We still need to correct coagulopathy in order to perform lumbar puncture safely 5 mg p.o. vitamin K given on 08/26/2019 Additional 5 mg given 08/27/2019 Recheck INR in the morning If INR greater than 1.5 consider FFP versus IV vitamin K.  Suggest touching base with interventional radiology in the morning.  Severe sepsis Enterococcus faecalis UTI Urine culture positive for Enterococcus faecalis sensitive to vancomycin Continue broad-spectrum IV antibiotics as above Follow blood cultures, no growth to date   Hypothyroidism Patient on Synthroid.  TSH nearly undetectable.  Likely  secondary to hypopituitarism.  Free T4 within normal limits.  Findings consistent with previously known history of hypopituitarism  Hyperlipidemia Continue statin  ADHD On Adderall XR.  This may be contributing factor to encephalopathy  Neurosarcoidosis Lumbar spinal stenosis Patient follows with neurologist and neurosurgeon at [facility] MRI lumbar spine on previous admission did demonstrate lumbar spinal stenosis Cervical spine did not demonstrate any cervical canal stenosis PT and OT consults when able   DVT prophylaxis: SCDs Code Status: Full Family Communication: Husband Fritz Pickerel via phone on 08/27/2019 Disposition Plan: Status is: Inpatient  Remains inpatient appropriate because:Altered mental status   Dispo: The patient is from: SNF              Anticipated d/c is to: SNF              Anticipated d/c date is: > 3 days              Patient currently is not medically stable to d/c.  Still persistently encephalopathic.  Unclear etiology.  Possible adrenal insufficiency.  On stress dose steroids.  Continue treatment for severe sepsis.   Consultants:   Neurology  Procedures:   None  Antimicrobials:   Vancomycin, 08/22/2019-    Rocephin, 08/24/19-  Flagyl, 08/24/19- 08/26/19     Subjective: Seen and examined.  Unable to provide history.  Remains encephalopathic, though improving  Objective: Vitals:   08/26/19 1920 08/27/19 0528 08/27/19 0806 08/27/19 1125  BP: 106/72 134/79 130/63 119/63  Pulse: (!) 104 (!) 101 96 (!) 102  Resp: 20 20  17 17  Temp: 100 F (37.8 C) 99.8 F (37.7 C) 98.5 F (36.9 C) 97.6 F (36.4 C)  TempSrc: Oral Oral Oral Oral  SpO2: 98% 98% 98% 100%  Weight:      Height:        Intake/Output Summary (Last 24 hours) at 08/27/2019 1429 Last data filed at 08/27/2019 1345 Gross per 24 hour  Intake 1727.74 ml  Output 1050 ml  Net 677.74 ml   Filed Weights   08/22/19 2115 08/23/19 0525 08/25/19 0334  Weight: 83.7 kg 84 kg 84.4 kg     Examination:  General exam: Encephalopathic.  Appears chronically ill Respiratory system: Normal work of breathing.  Bibasilar crackles Cardiovascular system: Tachycardic, regular rhythm, no murmurs. Gastrointestinal system: Soft, nontender, nondistended, normal bowel sounds Central nervous system: Unable to assess Extremities: Unable to assess Skin: No rashes, lesions or ulcers Psychiatry: Unable to assess, confused   Data Reviewed: I have personally reviewed following labs and imaging studies  CBC: Recent Labs  Lab 08/23/19 0529 08/24/19 0551 08/25/19 0607 08/26/19 0340 08/27/19 0716  WBC 9.7 10.3 10.0 10.2 14.4*  NEUTROABS 5.0 6.5 6.5 5.1 12.2*  HGB 10.4* 9.6* 10.1* 9.7* 9.7*  HCT 32.5* 30.5* 30.8* 29.5* 30.6*  MCV 87.1 87.4 84.6 84.3 86.9  PLT 622* 511* 486* 484* 523*   Basic Metabolic Panel: Recent Labs  Lab 08/23/19 0529 08/24/19 0551 08/25/19 0607 08/26/19 0340 08/27/19 0716  NA 144 148* 154* 155* 152*  K 3.8 3.4* 3.5 2.9* 3.6  CL 111 116* 124* 125* 123*  CO2 22 22 22 22 22   GLUCOSE 86 95 96 83 146*  BUN 17 14 12 9 12   CREATININE 1.31* 0.99 0.92 0.96 0.79  CALCIUM 8.3* 8.5* 8.7* 8.2* 8.2*   GFR: Estimated Creatinine Clearance: 71.7 mL/min (by C-G formula based on SCr of 0.79 mg/dL). Liver Function Tests: Recent Labs  Lab 08/21/19 2317 08/22/19 0355 08/26/19 0340  AST 141* 119* 76*  ALT 43 37 45*  ALKPHOS 93 75 61  BILITOT 1.0 1.0 0.7  PROT 6.5 5.4* 5.3*  ALBUMIN 2.8* 2.3* 2.1*   No results for input(s): LIPASE, AMYLASE in the last 168 hours. Recent Labs  Lab 08/24/19 1411  AMMONIA 11   Coagulation Profile: Recent Labs  Lab 08/23/19 0529 08/24/19 0551 08/25/19 0607 08/26/19 0340 08/27/19 0716  INR 4.5* 2.4* 2.6* 3.2* 2.6*   Cardiac Enzymes: No results for input(s): CKTOTAL, CKMB, CKMBINDEX, TROPONINI in the last 168 hours. BNP (last 3 results) No results for input(s): PROBNP in the last 8760 hours. HbA1C: No results for  input(s): HGBA1C in the last 72 hours. CBG: No results for input(s): GLUCAP in the last 168 hours. Lipid Profile: No results for input(s): CHOL, HDL, LDLCALC, TRIG, CHOLHDL, LDLDIRECT in the last 72 hours. Thyroid Function Tests: Recent Labs    08/25/19 1400  FREET4 0.91  T3FREE 1.2*   Anemia Panel: No results for input(s): VITAMINB12, FOLATE, FERRITIN, TIBC, IRON, RETICCTPCT in the last 72 hours. Sepsis Labs: Recent Labs  Lab 08/21/19 2317 08/22/19 0109 08/22/19 0355 08/22/19 0640 08/23/19 0529 08/24/19 0551 08/25/19 0607 08/26/19 0340 08/27/19 0716  PROCALCITON  --   --  37.93  --    < > 8.04 4.39 2.45 1.00  LATICACIDVEN 4.6* 3.2* 2.3* 1.9  --   --   --   --   --    < > = values in this interval not displayed.    Recent Results (from the past 240 hour(s))  Blood culture (routine x 2)     Status: None   Collection Time: 08/21/19 11:24 PM   Specimen: Right Antecubital; Blood  Result Value Ref Range Status   Specimen Description RIGHT ANTECUBITAL  Final   Special Requests   Final    BOTTLES DRAWN AEROBIC AND ANAEROBIC Blood Culture results may not be optimal due to an excessive volume of blood received in culture bottles   Culture   Final    NO GROWTH 5 DAYS Performed at [facility], [address] [place], [place] [address]    Report Status 08/27/2019 FINAL  Final  Blood culture (routine x 2)     Status: None   Collection Time: 08/21/19 11:29 PM   Specimen: BLOOD RIGHT WRIST  Result Value Ref Range Status   Specimen Description BLOOD RIGHT WRIST  Final   Special Requests   Final    BOTTLES DRAWN AEROBIC AND ANAEROBIC Blood Culture results may not be optimal due to an excessive volume of blood received in culture bottles   Culture   Final    NO GROWTH 5 DAYS Performed at [facility], [address] [place], [place] [address]    Report Status 08/27/2019 FINAL  Final  Urine Culture     Status: Abnormal   Collection Time: 08/22/19 12:46 AM     Specimen: Urine, Catheterized  Result Value Ref Range Status   Specimen Description   Final    URINE, CATHETERIZED Performed at [facility], [address] [place], [place] [address]    Special Requests   Final    NONE Performed at [facility], [address] [place], [place] [address]    Culture 40,000 COLONIES/mL ENTEROCOCCUS FAECIUM (A)  Final   Report Status 08/24/2019 FINAL  Final   Organism ID, Bacteria ENTEROCOCCUS FAECIUM (A)  Final      Susceptibility   Enterococcus faecium - MIC*    AMPICILLIN >=32 RESISTANT Resistant     NITROFURANTOIN 128 RESISTANT Resistant     VANCOMYCIN <=0.5 SENSITIVE Sensitive     * 40,000 COLONIES/mL ENTEROCOCCUS FAECIUM  SARS Coronavirus 2 by RT PCR (hospital order, performed in [facility] hospital lab) Nasopharyngeal Nasopharyngeal Swab     Status: None   Collection Time: 08/22/19  1:09 AM   Specimen: Nasopharyngeal Swab  Result Value Ref Range Status   SARS Coronavirus 2 NEGATIVE NEGATIVE Final    Comment: (NOTE) SARS-CoV-2 target nucleic acids are NOT DETECTED. The SARS-CoV-2 RNA is generally detectable in upper and lower respiratory specimens during the acute phase of infection. The lowest concentration of SARS-CoV-2 viral copies this assay can detect is 250 copies / mL. A negative result does not preclude SARS-CoV-2 infection and should not be used as the sole basis for treatment or other patient management decisions.  A negative result may occur with improper specimen collection / handling, submission of specimen other than nasopharyngeal swab, presence of viral mutation(s) within the areas targeted by this assay, and inadequate number of viral copies (<250 copies / mL). A negative result must be combined with clinical observations, patient history, and epidemiological information. Fact Sheet for Patients:   BoilerBrush.com.cy Fact Sheet for Healthcare  Providers: https://pope.com/ This test is not yet approved or cleared  by the [place] FDA and has been authorized for detection and/or diagnosis of SARS-CoV-2 by FDA under an Emergency Use Authorization (EUA).  This EUA will remain in effect (meaning this test can be used) for the duration of  the COVID-19 declaration under Section 564(b)(1) of the Act, 21 U.S.C. section 360bbb-3(b)(1), unless the authorization is terminated or revoked sooner. Performed at [facility], [address] [place], [place] [address]   MRSA PCR Screening     Status: None   Collection Time: 08/23/19  2:45 AM   Specimen: Nasopharyngeal  Result Value Ref Range Status   MRSA by PCR NEGATIVE NEGATIVE Final    Comment:        The GeneXpert MRSA Assay (FDA approved for NASAL specimens only), is one component of a comprehensive MRSA colonization surveillance program. It is not intended to diagnose MRSA infection nor to guide or monitor treatment for MRSA infections. Performed at [facility] [place], [address] [place], [place] [address]          Radiology Studies: EEG  Result Date: 08/26/2019 Thana Farreynolds, Leslie, MD     08/26/2019  7:05 PM ELECTROENCEPHALOGRAM REPORT Patient: Morgan Carpenter       Room #: 250A-AA EEG No. ID: [id number] Age: 69 y.o.        Sex: female Requesting Physician: Georgeann OppenheimSreenath Report Date:  08/26/2019       Interpreting Physician: Thana FarrEYNOLDS, LESLIE History: Morgan Carpenter is an 69 y.o. female with altered mental status Medications: Lipitor, Rocephin, Prozac, Synthroid, Vancomycin Conditions of Recording:  This is a 21 channel routine scalp EEG performed with bipolar and monopolar montages arranged in accordance to the international 10/20 system of electrode placement. One channel was dedicated to EKG recording. The patient is in the altered state. Description:  Artifact is prominent during the recording often obscuring the background rhythm. When able to  be visualized the background consists of poorly organized beta activity that is diffusely distributed.  This activity is continuous.  No epileptiform activity is noted.  Hyperventilation was not performed.  Intermittent photic stimulation was performed but failed to illicit any change in the tracing. IMPRESSION: This electroencephalogram consists of continuous beta activity of unclear significance.  No epileptiform activity is noted.  Thana FarrLeslie Reynolds, MD Neurology [phone] 08/26/2019, 6:55 PM   CT ABDOMEN PELVIS W CONTRAST  Result Date: 08/26/2019 CLINICAL DATA:  Clinical suspicion for intra-abdominal infectious focus. EXAM: CT ABDOMEN AND PELVIS WITH CONTRAST TECHNIQUE: Multidetector CT imaging of the abdomen and pelvis was performed using the standard protocol following bolus administration of intravenous contrast. CONTRAST:  100mL OMNIPAQUE IOHEXOL 300 MG/ML  SOLN COMPARISON:  None. FINDINGS: Lower chest: Bibasilar atelectasis but no definite infiltrates or effusions. The heart is normal in size. Hepatobiliary: Diffuse fatty infiltration of the liver. No focal hepatic lesions or intrahepatic biliary dilatation. Gallbladder is surgically absent. Pancreas: No mass, inflammation or ductal dilatation. Spleen: Normal size. No focal lesions. Adrenals/Urinary Tract: Small left adrenal gland nodule, likely benign adenoma. The right adrenal gland is normal. No renal lesions or definite findings for pyelonephritis or renal abscess. No renal or obstructing ureteral calculi or bladder calculi. There is a duplicated right collecting system and 2 ureters deep into the pelvis. The bladder is mildly distended. There is also mild asymmetric bladder wall thickening and surrounding interstitial changes in the perivesical fat. Findings suspicious for cystitis. No gas is seen in the bladder. Stomach/Bowel: The stomach, duodenum, small bowel and colon are grossly normal without oral contrast. No inflammatory changes, mass  lesions or obstructive findings. The appendix is normal. Vascular/Lymphatic: Scattered aortic calcifications but no aneurysm or dissection. The branch vessels are patent. An IVC filter is noted. No mesenteric or retroperitoneal mass or adenopathy. Reproductive:  The uterus and ovaries are unremarkable. Other: No pelvic mass or adenopathy. No free pelvic fluid collections. No inguinal mass or adenopathy. No abdominal wall hernia or subcutaneous lesions. Musculoskeletal: No significant bony findings. Moderate thoracolumbar scoliosis noted. IMPRESSION: 1. CT findings suspicious for acute cystitis. 2. No findings for pyelonephritis or renal abscess. 3. Duplicated right collecting system and 2 ureters deep into the pelvis. 4. Diffuse fatty infiltration of the liver. 5. Status post cholecystectomy. No biliary dilatation. 6. Aortic atherosclerosis. Aortic Atherosclerosis (ICD10-I70.0). Electronically Signed   By: Rudie Meyer M.D.   On: 08/26/2019 16:33   US Venous Img Lower Bilateral (DVT)  Result Date: 08/26/2019 CLINICAL DATA:  DVT EXAM: BILATERAL LOWER EXTREMITY VENOUS DOPPLER ULTRASOUND TECHNIQUE: Gray-scale sonography with compression, as well as color and duplex ultrasound, were performed to evaluate the deep venous system(s) from the level of the common femoral vein through the popliteal and proximal calf veins. COMPARISON:  None. FINDINGS: VENOUS Normal compressibility of the common femoral, superficial femoral, and popliteal veins, as well as the visualized calf veins. Visualized portions of profunda femoral vein and great saphenous vein unremarkable. No filling defects to suggest DVT on grayscale or color Doppler imaging. Doppler waveforms show normal direction of venous flow, normal respiratory plasticity and response to augmentation. OTHER None. Limitations: Body habitus. IMPRESSION: No evidence of lower extremity DVT. Electronically Signed   By: Charlett Nose M.D.   On: 08/26/2019 11:14         Scheduled Meds: . atorvastatin  40 mg Oral Daily  . FLUoxetine  10 mg Oral Daily  . hydrocortisone sod succinate (SOLU-CORTEF) inj  50 mg Intravenous Q6H  . levothyroxine  75 mcg Oral Q0600  . multivitamin with minerals  1 tablet Oral Daily  . pantoprazole  40 mg Oral Daily   Continuous Infusions: . cefTRIAXone (ROCEPHIN)  IV Stopped (08/26/19 2116)  . dextrose 50 mL/hr at 08/27/19 0425  . vancomycin       LOS: 5 days    Time spent: 35 minutes    Tresa Moore, MD [facility] Pager [phone]  If 7PM-7AM, please contact night-coverage 08/27/2019, 2:29 PM

## 2019-08-28 ENCOUNTER — Inpatient Hospital Stay: Payer: Medicare Other

## 2019-08-28 DIAGNOSIS — E785 Hyperlipidemia, unspecified: Secondary | ICD-10-CM

## 2019-08-28 DIAGNOSIS — R531 Weakness: Secondary | ICD-10-CM

## 2019-08-28 DIAGNOSIS — N3000 Acute cystitis without hematuria: Secondary | ICD-10-CM

## 2019-08-28 DIAGNOSIS — G9341 Metabolic encephalopathy: Secondary | ICD-10-CM

## 2019-08-28 DIAGNOSIS — L89152 Pressure ulcer of sacral region, stage 2: Secondary | ICD-10-CM

## 2019-08-28 DIAGNOSIS — A4181 Sepsis due to Enterococcus: Principal | ICD-10-CM

## 2019-08-28 LAB — CBC WITH DIFFERENTIAL/PLATELET
Abs Immature Granulocytes: 0.14 10*3/uL — ABNORMAL HIGH (ref 0.00–0.07)
Basophils Absolute: 0 10*3/uL (ref 0.0–0.1)
Basophils Relative: 0 %
Eosinophils Absolute: 0 10*3/uL (ref 0.0–0.5)
Eosinophils Relative: 0 %
HCT: 30.7 % — ABNORMAL LOW (ref 36.0–46.0)
Hemoglobin: 9.9 g/dL — ABNORMAL LOW (ref 12.0–15.0)
Immature Granulocytes: 1 %
Lymphocytes Relative: 10 %
Lymphs Abs: 1.9 10*3/uL (ref 0.7–4.0)
MCH: 27.3 pg (ref 26.0–34.0)
MCHC: 32.2 g/dL (ref 30.0–36.0)
MCV: 84.8 fL (ref 80.0–100.0)
Monocytes Absolute: 0.6 10*3/uL (ref 0.1–1.0)
Monocytes Relative: 3 %
Neutro Abs: 17.2 10*3/uL — ABNORMAL HIGH (ref 1.7–7.7)
Neutrophils Relative %: 86 %
Platelets: 562 10*3/uL — ABNORMAL HIGH (ref 150–400)
RBC: 3.62 MIL/uL — ABNORMAL LOW (ref 3.87–5.11)
RDW: 18.3 % — ABNORMAL HIGH (ref 11.5–15.5)
WBC: 19.8 10*3/uL — ABNORMAL HIGH (ref 4.0–10.5)
nRBC: 0.3 % — ABNORMAL HIGH (ref 0.0–0.2)

## 2019-08-28 LAB — CREATININE, SERUM
Creatinine, Ser: 0.92 mg/dL (ref 0.44–1.00)
GFR calc Af Amer: >60 mL/min (ref >60)
GFR calc non Af Amer: >60 mL/min (ref >60)

## 2019-08-28 LAB — PROTIME-INR
INR: 2 — ABNORMAL HIGH (ref 0.8–1.2)
Prothrombin Time: 21.7 seconds — ABNORMAL HIGH (ref 11.4–15.2)

## 2019-08-28 LAB — ACTH: C206 ACTH: <1.5 pg/mL — ABNORMAL LOW (ref 7.2–63.3)

## 2019-08-28 LAB — VITAMIN B1: Vitamin B1 (Thiamine): 111 nmol/L (ref 66.5–200.0)

## 2019-08-28 LAB — PROCALCITONIN: Procalcitonin: 0.61 ng/mL

## 2019-08-28 MED ORDER — PHYTONADIONE 5 MG PO TABS
5.0000 mg | ORAL_TABLET | Freq: Once | ORAL | Status: AC
  Administered 2019-08-28: 5 mg via ORAL
  Filled 2019-08-28: qty 1

## 2019-08-28 NOTE — Progress Notes (Signed)
Subjective: Patient continues to slowly improve.  More communicative this morning.    Objective: Current vital signs: BP (!) 142/85 (BP Location: Left Arm)   Pulse 91   Temp 98.4 F (36.9 C) (Oral)   Resp 18   Ht 5\' 4"  (1.626 m)   Wt 84.4 kg   SpO2 98%   BMI 31.94 kg/m  Vital signs in last 24 hours: Temp:  [97.6 F (36.4 C)-98.4 F (36.9 C)] 98.4 F (36.9 C) (05/26 0718) Pulse Rate:  [91-106] 91 (05/26 0718) Resp:  [16-20] 18 (05/26 0718) BP: (115-142)/(56-85) 142/85 (05/26 0718) SpO2:  [98 %-100 %] 98 % (05/26 0718)  Intake/Output from previous day: 05/25 0701 - 05/26 0700 In: 610 [P.O.:360; IV Piggyback:250] Out: 1250 [Urine:1250] Intake/Output this shift: No intake/output data recorded. Nutritional status:  Diet Order            DIET - DYS 1 Room service appropriate? Yes with Assist; Fluid consistency: Nectar Thick  Diet effective now              Neurologic Exam: Mental Status: Awake and alert.  Follows some simple commands.  Expressive aphasia noted with word finding difficulties  . Cranial Nerves: III,IV, VI: extra-ocular motions intact bilaterally V,VII: smile symmetric VIII: hearing normal bilaterally IX,X: gag reflex present XI: bilateral shoulder shrug XII: midline tongue extension Motor: Lifts both upper extremities against gravity, left greater than right.     Lab Results: Basic Metabolic Panel: Recent Labs  Lab 08/23/19 0529 08/23/19 0529 08/24/19 0551 08/24/19 0551 08/25/19 0607 08/26/19 0340 08/27/19 0716 08/28/19 0526  NA 144  --  148*  --  154* 155* 152*  --   K 3.8  --  3.4*  --  3.5 2.9* 3.6  --   CL 111  --  116*  --  124* 125* 123*  --   CO2 22  --  22  --  22 22 22   --   GLUCOSE 86  --  95  --  96 83 146*  --   BUN 17  --  14  --  12 9 12   --   CREATININE 1.31*   < > 0.99  --  0.92 0.96 0.79 0.92  CALCIUM 8.3*   < > 8.5*   < > 8.7* 8.2* 8.2*  --    < > = values in this interval not displayed.    Liver Function  Tests: Recent Labs  Lab 08/21/19 2317 08/22/19 0355 08/26/19 0340  AST 141* 119* 76*  ALT 43 37 45*  ALKPHOS 93 75 61  BILITOT 1.0 1.0 0.7  PROT 6.5 5.4* 5.3*  ALBUMIN 2.8* 2.3* 2.1*   No results for input(s): LIPASE, AMYLASE in the last 168 hours. Recent Labs  Lab 08/24/19 1411  AMMONIA 11    CBC: Recent Labs  Lab 08/24/19 0551 08/25/19 0607 08/26/19 0340 08/27/19 0716 08/28/19 0526  WBC 10.3 10.0 10.2 14.4* 19.8*  NEUTROABS 6.5 6.5 5.1 12.2* 17.2*  HGB 9.6* 10.1* 9.7* 9.7* 9.9*  HCT 30.5* 30.8* 29.5* 30.6* 30.7*  MCV 87.4 84.6 84.3 86.9 84.8  PLT 511* 486* 484* 523* 562*    Cardiac Enzymes: No results for input(s): CKTOTAL, CKMB, CKMBINDEX, TROPONINI in the last 168 hours.  Lipid Panel: No results for input(s): CHOL, TRIG, HDL, CHOLHDL, VLDL, LDLCALC in the last 168 hours.  CBG: No results for input(s): GLUCAP in the last 168 hours.  Microbiology: Results for orders placed or performed during the hospital  encounter of 08/21/19  Blood culture (routine x 2)     Status: None   Collection Time: 08/21/19 11:24 PM   Specimen: Right Antecubital; Blood  Result Value Ref Range Status   Specimen Description RIGHT ANTECUBITAL  Final   Special Requests   Final    BOTTLES DRAWN AEROBIC AND ANAEROBIC Blood Culture results may not be optimal due to an excessive volume of blood received in culture bottles   Culture   Final    NO GROWTH 5 DAYS Performed at [facility], [address] [place], [place] [address]    Report Status 08/27/2019 FINAL  Final  Blood culture (routine x 2)     Status: None   Collection Time: 08/21/19 11:29 PM   Specimen: BLOOD RIGHT WRIST  Result Value Ref Range Status   Specimen Description BLOOD RIGHT WRIST  Final   Special Requests   Final    BOTTLES DRAWN AEROBIC AND ANAEROBIC Blood Culture results may not be optimal due to an excessive volume of blood received in culture bottles   Culture   Final    NO GROWTH 5  DAYS Performed at [facility], [address], [place], [place] [address]    Report Status 08/27/2019 FINAL  Final  Urine Culture     Status: Abnormal   Collection Time: 08/22/19 12:46 AM   Specimen: Urine, Catheterized  Result Value Ref Range Status   Specimen Description   Final    URINE, CATHETERIZED Performed at [facility], [address] [place], [place] [address]    Special Requests   Final    NONE Performed at [facility], [address] [place], [place] [address]    Culture 40,000 COLONIES/mL ENTEROCOCCUS FAECIUM (A)  Final   Report Status 08/24/2019 FINAL  Final   Organism ID, Bacteria ENTEROCOCCUS FAECIUM (A)  Final      Susceptibility   Enterococcus faecium - MIC*    AMPICILLIN >=32 RESISTANT Resistant     NITROFURANTOIN 128 RESISTANT Resistant     VANCOMYCIN <=0.5 SENSITIVE Sensitive     * 40,000 COLONIES/mL ENTEROCOCCUS FAECIUM  SARS Coronavirus 2 by RT PCR (hospital order, performed in [facility] hospital lab) Nasopharyngeal Nasopharyngeal Swab     Status: None   Collection Time: 08/22/19  1:09 AM   Specimen: Nasopharyngeal Swab  Result Value Ref Range Status   SARS Coronavirus 2 NEGATIVE NEGATIVE Final    Comment: (NOTE) SARS-CoV-2 target nucleic acids are NOT DETECTED. The SARS-CoV-2 RNA is generally detectable in upper and lower respiratory specimens during the acute phase of infection. The lowest concentration of SARS-CoV-2 viral copies this assay can detect is 250 copies / mL. A negative result does not preclude SARS-CoV-2 infection and should not be used as the sole basis for treatment or other patient management decisions.  A negative result may occur with improper specimen collection / handling, submission of specimen other than nasopharyngeal swab, presence of viral mutation(s) within the areas targeted by this assay, and inadequate number of viral copies (<250 copies / mL). A negative result must be combined with  clinical observations, patient history, and epidemiological information. Fact Sheet for Patients:   BoilerBrush.com.cy Fact Sheet for Healthcare Providers: https://pope.com/ This test is not yet approved or cleared  by the [place] FDA and has been authorized for detection and/or diagnosis of SARS-CoV-2 by FDA under an Emergency Use Authorization (EUA).  This EUA will remain in effect (meaning this test can be used) for  the duration of the COVID-19 declaration under Section 564(b)(1) of the Act, 21 U.S.C. section 360bbb-3(b)(1), unless the authorization is terminated or revoked sooner. Performed at [facility], [address] [place], [place] [address]   MRSA PCR Screening     Status: None   Collection Time: 08/23/19  2:45 AM   Specimen: Nasopharyngeal  Result Value Ref Range Status   MRSA by PCR NEGATIVE NEGATIVE Final    Comment:        The GeneXpert MRSA Assay (FDA approved for NASAL specimens only), is one component of a comprehensive MRSA colonization surveillance program. It is not intended to diagnose MRSA infection nor to guide or monitor treatment for MRSA infections. Performed at [facility], [address], [place], [place] [address]     Coagulation Studies: Recent Labs    08/26/19 0340 08/27/19 0716 08/28/19 0526  LABPROT 32.0* 27.1* 21.7*  INR 3.2* 2.6* 2.0*    Imaging: EEG  Result Date: 08/26/2019 Thana Farr, MD     08/26/2019  7:05 PM ELECTROENCEPHALOGRAM REPORT Patient: DALY WHIPKEY       Room #: 250A-AA EEG No. ID: [id number] Age: 69 y.o.        Sex: female Requesting Physician: Georgeann Oppenheim Report Date:  08/26/2019       Interpreting Physician: Thana Farr History: NILEY HELBIG is an 70 y.o. female with altered mental status Medications: Lipitor, Rocephin, Prozac, Synthroid, Vancomycin Conditions of Recording:  This is a 21 channel routine scalp EEG performed with bipolar  and monopolar montages arranged in accordance to the international 10/20 system of electrode placement. One channel was dedicated to EKG recording. The patient is in the altered state. Description:  Artifact is prominent during the recording often obscuring the background rhythm. When able to be visualized the background consists of poorly organized beta activity that is diffusely distributed.  This activity is continuous.  No epileptiform activity is noted.  Hyperventilation was not performed.  Intermittent photic stimulation was performed but failed to illicit any change in the tracing. IMPRESSION: This electroencephalogram consists of continuous beta activity of unclear significance.  No epileptiform activity is noted.  Thana Farr, MD Neurology [phone] 08/26/2019, 6:55 PM   CT ABDOMEN PELVIS W CONTRAST  Result Date: 08/26/2019 CLINICAL DATA:  Clinical suspicion for intra-abdominal infectious focus. EXAM: CT ABDOMEN AND PELVIS WITH CONTRAST TECHNIQUE: Multidetector CT imaging of the abdomen and pelvis was performed using the standard protocol following bolus administration of intravenous contrast. CONTRAST:  OMNIPAQUE IOHEXOL 300 MG/ML  SOLN COMPARISON:  None. FINDINGS: Lower chest: Bibasilar atelectasis but no definite infiltrates or effusions. The heart is normal in size. Hepatobiliary: Diffuse fatty infiltration of the liver. No focal hepatic lesions or intrahepatic biliary dilatation. Gallbladder is surgically absent. Pancreas: No mass, inflammation or ductal dilatation. Spleen: Normal size. No focal lesions. Adrenals/Urinary Tract: Small left adrenal gland nodule, likely benign adenoma. The right adrenal gland is normal. No renal lesions or definite findings for pyelonephritis or renal abscess. No renal or obstructing ureteral calculi or bladder calculi. There is a duplicated right collecting system and 2 ureters deep into the pelvis. The bladder is mildly distended. There is also mild  asymmetric bladder wall thickening and surrounding interstitial changes in the perivesical fat. Findings suspicious for cystitis. No gas is seen in the bladder. Stomach/Bowel: The stomach, duodenum, small bowel and colon are grossly normal without oral contrast. No inflammatory changes, mass lesions or obstructive findings. The appendix is normal. Vascular/Lymphatic: Scattered aortic calcifications  but no aneurysm or dissection. The branch vessels are patent. An IVC filter is noted. No mesenteric or retroperitoneal mass or adenopathy. Reproductive: The uterus and ovaries are unremarkable. Other: No pelvic mass or adenopathy. No free pelvic fluid collections. No inguinal mass or adenopathy. No abdominal wall hernia or subcutaneous lesions. Musculoskeletal: No significant bony findings. Moderate thoracolumbar scoliosis noted. IMPRESSION: 1. CT findings suspicious for acute cystitis. 2. No findings for pyelonephritis or renal abscess. 3. Duplicated right collecting system and 2 ureters deep into the pelvis. 4. Diffuse fatty infiltration of the liver. 5. Status post cholecystectomy. No biliary dilatation. 6. Aortic atherosclerosis. Aortic Atherosclerosis (ICD10-I70.0). Electronically Signed   By: Marijo Sanes M.D.   On: 08/26/2019 16:33   US Venous Img Lower Bilateral (DVT)  Result Date: 08/26/2019 CLINICAL DATA:  DVT EXAM: BILATERAL LOWER EXTREMITY VENOUS DOPPLER ULTRASOUND TECHNIQUE: Gray-scale sonography with compression, as well as color and duplex ultrasound, were performed to evaluate the deep venous system(s) from the level of the common femoral vein through the popliteal and proximal calf veins. COMPARISON:  None. FINDINGS: VENOUS Normal compressibility of the common femoral, superficial femoral, and popliteal veins, as well as the visualized calf veins. Visualized portions of profunda femoral vein and great saphenous vein unremarkable. No filling defects to suggest DVT on grayscale or color Doppler  imaging. Doppler waveforms show normal direction of venous flow, normal respiratory plasticity and response to augmentation. OTHER None. Limitations: Body habitus. IMPRESSION: No evidence of lower extremity DVT. Electronically Signed   By: Rolm Baptise M.D.   On: 08/26/2019 11:14    Medications:  I have reviewed the patient's current medications. Scheduled: . atorvastatin  40 mg Oral Daily  . FLUoxetine  10 mg Oral Daily  . hydrocortisone sod succinate (SOLU-CORTEF) inj  50 mg Intravenous Q6H  . levothyroxine  75 mcg Oral Q0600  . multivitamin with minerals  1 tablet Oral Daily  . pantoprazole  40 mg Oral Daily    Assessment/Plan: 69 y.o.femaleAfrican-American femalewith a known history of sarcoidosis, obstructive sleep apnea, hypopituitarismon low dose Prednisone, dyslipidemia and CVAwith residual right hemiparesis, who presented to the emergency room with acute onset of altered mental status with incoherence and confusion. The patient was diagnosed with UTI. Patient initially febrile with elevated white blood cell count, procalcitonin and evidence of renal insufficiency above baseline. Started on broad spectrum antibiotics to include Cefepime, Vancomycin and Flagyl. Thereinitially was someimprovement in her fever, white blood cell count and renal insufficiency. LFT's were elevated as well. Also improving. Mental statuscontinues to slowly improve.  Patient afebrile overnight.,  MRI of the brain personally reviewed and shows no acute changes. EEG findings nonspecific.   ID has evaluated patient and feel that LP is indicated.  Still unable to perform due to elevated INR (2.0 today) although unclear if this continues to be necessary with patient continuing to improve.    Plan: Will continue to follow with you   LOS: 6 days   Alexis Goodell, MD Neurology [phone] 08/28/2019  10:11 AM

## 2019-08-28 NOTE — Progress Notes (Signed)
ID Pt more alert and awake Has some non purposeful movt with her hands Still has expressive dysphasia ( on speaking to her husband she has  dysphasia  at baseline) Was able to talk full sentence at times  O/E Patient Vitals for the past 24 hrs:  BP Temp Temp src Pulse Resp SpO2  08/28/19 1108 (!) 141/87 98.6 F (37 C) Oral 93 17 99 %  08/28/19 0718 (!) 142/85 98.4 F (36.9 C) Oral 91 18 98 %  08/28/19 0411 137/69 98.3 F (36.8 C) Oral 91 20 98 %  08/28/19 0300 -- -- -- -- 20 --  08/27/19 2200 -- -- -- -- -- 98 %  08/27/19 1913 (!) 125/56 98.1 F (36.7 C) Oral 96 20 99 %  08/27/19 1520 115/67 98.4 F (36.9 C) -- (!) 106 16 98 %   Awake, more alert, responds to commands , expressive dysphasia No neck stiffness Chest b/l air entry H s1s2 abd soft Moves left arm more than rt Movement legs limited Edema legs  CBC Latest Ref Rng & Units 08/28/2019 08/27/2019 08/26/2019  WBC 4.0 - 10.5 K/uL 19.8(H) 14.4(H) 10.2  Hemoglobin 12.0 - 15.0 g/dL 9.5(K) 9.3(O) 6.7(T)  Hematocrit 36.0 - 46.0 % 30.7(L) 30.6(L) 29.5(L)  Platelets 150 - 400 K/uL 562(H) 523(H) 484(H)    CMP Latest Ref Rng & Units 08/28/2019 08/27/2019 08/26/2019  Glucose 70 - 99 mg/dL - 245(Y) 83  BUN 8 - 23 mg/dL - 12 9  Creatinine 0.99 - 1.00 mg/dL 8.33 8.25 0.53  Sodium 135 - 145 mmol/L - 152(H) 155(H)  Potassium 3.5 - 5.1 mmol/L - 3.6 2.9(L)  Chloride 98 - 111 mmol/L - 123(H) 125(H)  CO2 22 - 32 mmol/L - 22 22  Calcium 8.9 - 10.3 mg/dL - 8.2(L) 8.2(L)  Total Protein 6.5 - 8.1 g/dL - - 5.3(L)  Total Bilirubin 0.3 - 1.2 mg/dL - - 0.7  Alkaline Phos 38 - 126 U/L - - 61  AST 15 - 41 U/L - - 76(H)  ALT 0 - 44 U/L - - 45(H)    Imaging Chronic encephalomalacia in the left MCA territory, with sequelae of prior aneurysm coil embolization at the left ICA terminus.     Impression/recommendation 69 y.o.femalewith a history ofhypopituitarism secondary to lymphocytichypophysitis ( neurosarcoid ), secondary adrenal  insufficiency and secondary hypothyroidism followed at [facility], DVT, CVA 2009 with unruptured left posterior communicating artery aneurysm which was coiled rt hemiplegia and baseline aphasia , mixed incontinence, UTIs  Admitted withaltered mental status and fever from [facility] 08/21/2019. ? ?Fever and encephalopathy initially due to Urosepsis due to enterococcus faecium-  As both the encephalopathy and fever persisted in spite of appropriate antibiotics it was thought to be either due to adrenal crisis ( from not getting stress dose steroids when acutely ill) or CNS infection  On Monday 08/26/19 We started her on stress dose steroids and the fever has resolved and encephalopathy has resolved She now has expressive dysphasia and some confusion which could be near baseline. We will not proceed with LP as the risk outweighs any benefit and we dont think she has meningitis No significant residue on bladder scan ( 100cc)  Current leucocytosis is from stress dose steroids Pt has completed 7 days of IV antibioticsand procalcitonin has declined from 37 to 0.7  we will discontinue ceftriaxone now and vanco after today's dose  and see her progress    Enterococcus UTI in the nursing home and she was on linezolid from 08/20/2019. Could  she have had serotonin syndrome secondary to drug -drug interactions with fluoxetine tizanidine tramadoland linezolid  Abnormal INR and PTT. On admission the INR was 7 and PTT was 58.4. Unclear reason. Patient was only on Xarelto.  Transaminitison admission. ? Medication? Infection  Normal bilirubin and ammonia.  AKI on admission has resolved  Hypernatremia.she needs more free water  Hypokalemia    CVA in 2009 due to left MCA infarct secondary to left unruptured posterior communicating aneurysm needing coiling- now has chronic encephalomalacia on the left side Also has dysphasia, rt hemiplegia because of that.   Secondary hypothyroidism on Synthroid.  Her normal dose of 75 mcg/day  TSH less than 0.10,( low) [id number] which is low and free T-4 is 0.91( normal). Not clear how to interpret these results. Could be from the acute illness??.   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 _History of DVT on Xarelto  Case conference  with Dr.Wieting and Dr.Reynolds and management decided accordingly Discussed in great detail the management plan with Mr.Horton her husband

## 2019-08-28 NOTE — Progress Notes (Addendum)
SLP Cancellation Note  Patient Details Name: Morgan Carpenter MRN: [id number] DOB: [birth year]   Cancelled treatment:       Reason Eval/Treat Not Completed: Patient at procedure or test/unavailable(chart reviewed; pt is NPO per NSG report for test). Consulted NSG re: pt's status; noted improvement overall in pt's presentation: more purposeful engagement, and more verbal engagement. She was awake, this morning and attempting to talk w/ SLP holding onto her tote bag. Expressive deficits noted. Noted per MD note, pt has expressive language deficits at baseline per husband(?). Will attempt to contact husband to determine pt's baseline; pt may benefit from a Cognitive-linguistic assessment if any new changes from her baseline.  Pt is currently NPO for test this afternoon per NSG. Will attempt po trials of thin liquids and softened solids in order to upgrade diet next 1-2 days now that pt's mental status has improved. Recommend continue aspiration precautions including Pills in Puree. NSG agreed.    Jerilynn Som, MS, CCC-SLP Tamicka Shimon 08/28/2019, 1:53 PM

## 2019-08-28 NOTE — Progress Notes (Signed)
Patient ID: Morgan Carpenter, female   DOB: 1950-06-13, 69 y.o.   MRN: [id number] [facility] PROGRESS NOTE  GABBIE MARZO WUJ:[id number] DOB: 11-05-1950 DOA: 08/21/2019 PCP: Michail Sermon, MD  HPI/Subjective: Patient able to answer some yes or no questions but unable to get the words out to elaborate much more.  Husband states that she is better than she was a few days ago.  Objective: Vitals:   08/28/19 0718 08/28/19 1108  BP: (!) 142/85 (!) 141/87  Pulse: 91 93  Resp: 18 17  Temp: 98.4 F (36.9 C) 98.6 F (37 C)  SpO2: 98% 99%    Intake/Output Summary (Last 24 hours) at 08/28/2019 1557 Last data filed at 08/28/2019 1108 Gross per 24 hour  Intake 250 ml  Output 500 ml  Net -250 ml   Filed Weights   08/22/19 2115 08/23/19 0525 08/25/19 0334  Weight: 83.7 kg 84 kg 84.4 kg    ROS: Review of Systems  Unable to perform ROS: Acuity of condition  Eyes: Negative for blurred vision.  Respiratory: Negative for shortness of breath.   Cardiovascular: Negative for chest pain.  Gastrointestinal: Negative for abdominal pain.  Neurological: Negative for headaches.   Exam: Physical Exam  HENT:  Nose: No mucosal edema.  Mouth/Throat: No oropharyngeal exudate or posterior oropharyngeal edema.  Eyes: Pupils are equal, round, and reactive to light. Conjunctivae and lids are normal.  Neck: Carotid bruit is not present.  Cardiovascular: S1 normal, S2 normal and normal heart sounds.  Respiratory: She has decreased breath sounds in the right lower field and the left lower field. She has no wheezes. She has no rhonchi. She has no rales.  GI: Soft. Bowel sounds are normal. There is no abdominal tenderness.  Musculoskeletal:     Right ankle: Swelling present.     Left ankle: Swelling present.  Neurological: She is alert.  Unable to straight leg raise but able to flex and extend at the ankle.  Able to lift both arms up.  Follows some simple commands.  Able to answer some yes or no questions  but unable to get sentences together at this point  Skin: Skin is warm. Nails show no clubbing.  Psychiatric:  Answers some yes or no questions but unable to elaborate      Data Reviewed: Basic Metabolic Panel: Recent Labs  Lab 08/23/19 0529 08/23/19 0529 08/24/19 0551 08/25/19 0607 08/26/19 0340 08/27/19 0716 08/28/19 0526  NA 144  --  148* 154* 155* 152*  --   K 3.8  --  3.4* 3.5 2.9* 3.6  --   CL 111  --  116* 124* 125* 123*  --   CO2 22  --  22 22 22 22   --   GLUCOSE 86  --  95 96 83 146*  --   BUN 17  --  14 12 9 12   --   CREATININE 1.31*   < > 0.99 0.92 0.96 0.79 0.92  CALCIUM 8.3*  --  8.5* 8.7* 8.2* 8.2*  --    < > = values in this interval not displayed.   Liver Function Tests: Recent Labs  Lab 08/21/19 2317 08/22/19 0355 08/26/19 0340  AST 141* 119* 76*  ALT 43 37 45*  ALKPHOS 93 75 61  BILITOT 1.0 1.0 0.7  PROT 6.5 5.4* 5.3*  ALBUMIN 2.8* 2.3* 2.1*    Recent Labs  Lab 08/24/19 1411  AMMONIA 11   CBC: Recent Labs  Lab 08/24/19 0551 08/25/19  7619 08/26/19 0340 08/27/19 0716 08/28/19 0526  WBC 10.3 10.0 10.2 14.4* 19.8*  NEUTROABS 6.5 6.5 5.1 12.2* 17.2*  HGB 9.6* 10.1* 9.7* 9.7* 9.9*  HCT 30.5* 30.8* 29.5* 30.6* 30.7*  MCV 87.4 84.6 84.3 86.9 84.8  PLT 511* 486* 484* 523* 562*     Recent Results (from the past 240 hour(s))  Blood culture (routine x 2)     Status: None   Collection Time: 08/21/19 11:24 PM   Specimen: Right Antecubital; Blood  Result Value Ref Range Status   Specimen Description RIGHT ANTECUBITAL  Final   Special Requests   Final    BOTTLES DRAWN AEROBIC AND ANAEROBIC Blood Culture results may not be optimal due to an excessive volume of blood received in culture bottles   Culture   Final    NO GROWTH 5 DAYS Performed at [facility], [address] [place], [place] [address]    Report Status 08/27/2019 FINAL  Final  Blood culture (routine x 2)     Status: None   Collection Time: 08/21/19 11:29 PM    Specimen: BLOOD RIGHT WRIST  Result Value Ref Range Status   Specimen Description BLOOD RIGHT WRIST  Final   Special Requests   Final    BOTTLES DRAWN AEROBIC AND ANAEROBIC Blood Culture results may not be optimal due to an excessive volume of blood received in culture bottles   Culture   Final    NO GROWTH 5 DAYS Performed at [facility], [address] [place], [place] [address]    Report Status 08/27/2019 FINAL  Final  Urine Culture     Status: Abnormal   Collection Time: 08/22/19 12:46 AM   Specimen: Urine, Catheterized  Result Value Ref Range Status   Specimen Description   Final    URINE, CATHETERIZED Performed at [facility], [address] [place], [place] [address]    Special Requests   Final    NONE Performed at [facility], [address] [place], [place] [address]    Culture 40,000 COLONIES/mL ENTEROCOCCUS FAECIUM (A)  Final   Report Status 08/24/2019 FINAL  Final   Organism ID, Bacteria ENTEROCOCCUS FAECIUM (A)  Final      Susceptibility   Enterococcus faecium - MIC*    AMPICILLIN >=32 RESISTANT Resistant     NITROFURANTOIN 128 RESISTANT Resistant     VANCOMYCIN <=0.5 SENSITIVE Sensitive     * 40,000 COLONIES/mL ENTEROCOCCUS FAECIUM  SARS Coronavirus 2 by RT PCR (hospital order, performed in [facility] hospital lab) Nasopharyngeal Nasopharyngeal Swab     Status: None   Collection Time: 08/22/19  1:09 AM   Specimen: Nasopharyngeal Swab  Result Value Ref Range Status   SARS Coronavirus 2 NEGATIVE NEGATIVE Final    Comment: (NOTE) SARS-CoV-2 target nucleic acids are NOT DETECTED. The SARS-CoV-2 RNA is generally detectable in upper and lower respiratory specimens during the acute phase of infection. The lowest concentration of SARS-CoV-2 viral copies this assay can detect is 250 copies / mL. A negative result does not preclude SARS-CoV-2 infection and should not be used as the sole basis for treatment or other patient management  decisions.  A negative result may occur with improper specimen collection / handling, submission of specimen other than nasopharyngeal swab, presence of viral mutation(s) within the areas targeted by this assay, and inadequate number of viral copies (<250 copies / mL). A negative result must be combined with clinical observations, patient history, and epidemiological information. Fact Sheet  for Patients:   BoilerBrush.com.cy Fact Sheet for Healthcare Providers: https://pope.com/ This test is not yet approved or cleared  by the [place] FDA and has been authorized for detection and/or diagnosis of SARS-CoV-2 by FDA under an Emergency Use Authorization (EUA).  This EUA will remain in effect (meaning this test can be used) for the duration of the COVID-19 declaration under Section 564(b)(1) of the Act, 21 U.S.C. section 360bbb-3(b)(1), unless the authorization is terminated or revoked sooner. Performed at [facility], [address] [place], [place] [address]   MRSA PCR Screening     Status: None   Collection Time: 08/23/19  2:45 AM   Specimen: Nasopharyngeal  Result Value Ref Range Status   MRSA by PCR NEGATIVE NEGATIVE Final    Comment:        The GeneXpert MRSA Assay (FDA approved for NASAL specimens only), is one component of a comprehensive MRSA colonization surveillance program. It is not intended to diagnose MRSA infection nor to guide or monitor treatment for MRSA infections. Performed at [facility], [address] [place] [place], [place] [address]      Studies: EEG  Result Date: 08/26/2019 Thana Farr, MD     08/26/2019  7:05 PM ELECTROENCEPHALOGRAM REPORT Patient: CALLEIGH LAFONTANT       Room #: 250A-AA EEG No. ID: [id number] Age: 69 y.o.        Sex: female Requesting Physician: Georgeann Oppenheim Report Date:  08/26/2019       Interpreting Physician: Thana Farr History: CHANTELLE VERDI is an 69 y.o. female  with altered mental status Medications: Lipitor, Rocephin, Prozac, Synthroid, Vancomycin Conditions of Recording:  This is a 21 channel routine scalp EEG performed with bipolar and monopolar montages arranged in accordance to the international 10/20 system of electrode placement. One channel was dedicated to EKG recording. The patient is in the altered state. Description:  Artifact is prominent during the recording often obscuring the background rhythm. When able to be visualized the background consists of poorly organized beta activity that is diffusely distributed.  This activity is continuous.  No epileptiform activity is noted.  Hyperventilation was not performed.  Intermittent photic stimulation was performed but failed to illicit any change in the tracing. IMPRESSION: This electroencephalogram consists of continuous beta activity of unclear significance.  No epileptiform activity is noted.  Thana Farr, MD Neurology [phone] 08/26/2019, 6:55 PM   CT ABDOMEN PELVIS W CONTRAST  Result Date: 08/26/2019 CLINICAL DATA:  Clinical suspicion for intra-abdominal infectious focus. EXAM: CT ABDOMEN AND PELVIS WITH CONTRAST TECHNIQUE: Multidetector CT imaging of the abdomen and pelvis was performed using the standard protocol following bolus administration of intravenous contrast. CONTRAST:  OMNIPAQUE IOHEXOL 300 MG/ML  SOLN COMPARISON:  None. FINDINGS: Lower chest: Bibasilar atelectasis but no definite infiltrates or effusions. The heart is normal in size. Hepatobiliary: Diffuse fatty infiltration of the liver. No focal hepatic lesions or intrahepatic biliary dilatation. Gallbladder is surgically absent. Pancreas: No mass, inflammation or ductal dilatation. Spleen: Normal size. No focal lesions. Adrenals/Urinary Tract: Small left adrenal gland nodule, likely benign adenoma. The right adrenal gland is normal. No renal lesions or definite findings for pyelonephritis or renal abscess. No renal or  obstructing ureteral calculi or bladder calculi. There is a duplicated right collecting system and 2 ureters deep into the pelvis. The bladder is mildly distended. There is also mild asymmetric bladder wall thickening and surrounding interstitial changes in the perivesical fat. Findings suspicious for cystitis. No gas is seen in  the bladder. Stomach/Bowel: The stomach, duodenum, small bowel and colon are grossly normal without oral contrast. No inflammatory changes, mass lesions or obstructive findings. The appendix is normal. Vascular/Lymphatic: Scattered aortic calcifications but no aneurysm or dissection. The branch vessels are patent. An IVC filter is noted. No mesenteric or retroperitoneal mass or adenopathy. Reproductive: The uterus and ovaries are unremarkable. Other: No pelvic mass or adenopathy. No free pelvic fluid collections. No inguinal mass or adenopathy. No abdominal wall hernia or subcutaneous lesions. Musculoskeletal: No significant bony findings. Moderate thoracolumbar scoliosis noted. IMPRESSION: 1. CT findings suspicious for acute cystitis. 2. No findings for pyelonephritis or renal abscess. 3. Duplicated right collecting system and 2 ureters deep into the pelvis. 4. Diffuse fatty infiltration of the liver. 5. Status post cholecystectomy. No biliary dilatation. 6. Aortic atherosclerosis. Aortic Atherosclerosis (ICD10-I70.0). Electronically Signed   By: Rudie MeyerP.  Gallerani M.D.   On: 08/26/2019 16:33    Scheduled Meds: . atorvastatin  40 mg Oral Daily  . FLUoxetine  10 mg Oral Daily  . hydrocortisone sod succinate (SOLU-CORTEF) inj  50 mg Intravenous Q6H  . levothyroxine  75 mcg Oral Q0600  . multivitamin with minerals  1 tablet Oral Daily  . pantoprazole  40 mg Oral Daily   Continuous Infusions: . dextrose 50 mL/hr at 08/27/19 0425  . vancomycin 750 mg (08/28/19 0515)    Assessment/Plan:   1. Acute metabolic encephalopathy, expressive aphasia.  MRI of the brain negative for acute  infarct.  Patient started improving with stress dose steroids.  Case discussed with ID and neurology and will hold off on lumbar puncture.  Husband states that the patient's mental status is better than 2 days ago but still not back to her baseline mental status yet. 2. Acute cystitis on vancomycin.  Other antibiotics stopped. 3. Severe sepsis, present on admission.  Enterococcus faecalis on UTI 4. Stage II sacral decubitus present on admission see description below 5. Hypothyroidism unspecified.  TSH very low 6. Hyperlipidemia on statin 7. ADHD on Adderall 8. Weakness.  PT and OT consultations  Pressure Injury 08/22/19 Buttocks Right;Left Stage 2 -  Partial thickness loss of dermis presenting as a shallow open injury with a red, pink wound bed without slough. (Active)  08/22/19 2100  Location: Buttocks  Location Orientation: Right;Left  Staging: Stage 2 -  Partial thickness loss of dermis presenting as a shallow open injury with a red, pink wound bed without slough.  Wound Description (Comments):   Present on Admission: Yes       Code Status:     Code Status Orders  (From admission, onward)         Start     Ordered   08/22/19 0159  Full code  Continuous     08/22/19 0200        Code Status History    Date Active Date Inactive Code Status Order ID Comments User Context   08/01/2019 1143 08/09/2019 2221 Full Code [id number]  Lucile ShuttersAgbata, Tochukwu, MD ED   Advance Care Planning Activity     Family Communication: Spoke with husband on the phone Disposition Plan: Status is: Inpatient  Dispo: The patient is from: Rehab              Anticipated d/c is to: Rehab              Anticipated d/c date is: Likely will need at least another 4 days here in the hospital  Patient currently medically not stable to go out to rehab at this point.  Would like to get her mental status back to baseline prior to disposition  Consultants:  Neurology  Infectious  disease  Antibiotics:  Vancomycin  Time spent: 28 minutes  [place]

## 2019-08-28 NOTE — Evaluation (Signed)
Occupational Therapy Evaluation Patient Details Name: Morgan Carpenter MRN: [id number] DOB: 02-18-1951 Today's Date: 08/28/2019    History of Present Illness Morgan Carpenter is a 69 year old female admitted for sepsis and UTI, pt came from [facility].  Pt's PMH includes neurosarcoidosis, adrenal insufficiency, degnerative disc disease, OA, DVT, hypopituitarism, and OSA, CVA with R side weakness, and lumbar spinal stenosis.   Clinical Impression   Morgan Carpenter presents to OT with aphasia, generalized weakness, limited endurance, and impaired balance that impact her ability to safely and independently complete functional tasks.  Pt with expressive aphasia, but clearly understands questions/commands.  Information for occupational profile was gathered from pt's partner.  Prior to hospital admission several weeks ago, pt was mod I with ADLs using RW.  Pt currently presents with limited purposeful movement, LUE > RUE 2/2 residual R side weakness from previous CVA.  Pt able to initiate movement to attempt log rolling, but was ultimately unable to complete transfer 2/2 pain.  OTR attempted to provide max assist for bed mobility, but pt declined 2/2 pain.  Pt grimacing and crying out with any movement, but was unable to state location of pain.  RN notified.  Pt requires max assist for ADLs at bedlevel at this time.  Morgan Carpenter will continue to benefit from skilled OT services in acute setting to address functional strengthening, cognition, and safety and independence in ADLs.  Recommend SNF upon discharge.     Follow Up Recommendations  SNF;Supervision/Assistance - 24 hour    Equipment Recommendations  Hospital bed    Recommendations for Other Services       Precautions / Restrictions Precautions Precautions: Fall Restrictions Weight Bearing Restrictions: No      Mobility Bed Mobility Overal bed mobility: Needs Assistance Bed Mobility: Rolling;Supine to Sit Rolling: Max assist   Supine to sit: Total  assist     General bed mobility comments: Unable to complete 2/2 pt pain  Transfers Overall transfer level: Needs assistance               General transfer comment: not tested    Balance Overall balance assessment: Needs assistance     Sitting balance - Comments: not tested       Standing balance comment: not tested, per partner's report pt requires heavy assist to stand or walk                           ADL either performed or assessed with clinical judgement   ADL Overall ADL's : Needs assistance/impaired Eating/Feeding: Maximal assistance;Bed level   Grooming: Maximal assistance;Bed level   Upper Body Bathing: Maximal assistance;Bed level   Lower Body Bathing: Maximal assistance;Bed level   Upper Body Dressing : Maximal assistance;Bed level   Lower Body Dressing: Maximal assistance;Bed level                 General ADL Comments: Unable to assess OOB mobility 2/2 pt's pain and weakness.  Pt with limited purposeful movement, likely requires max assist for bedlevel self care tasks.     Vision Patient Visual Report: No change from baseline       Perception     Praxis      Pertinent Vitals/Pain Pain Assessment: Faces Faces Pain Scale: Hurts even more Pain Location: unable to state location of pain, grimaced and cried out with any movement Pain Descriptors / Indicators: Aching;Sore;Guarding;Grimacing;Crying Pain Intervention(s): Limited activity within patient's tolerance;Monitored during session;Patient requesting pain  meds-RN notified     Hand Dominance     Extremity/Trunk Assessment Upper Extremity Assessment Upper Extremity Assessment: Generalized weakness(LUE stronger than RUE (residual CVA weakness), limited purposeful movement/AROM, full PROM)   Lower Extremity Assessment Lower Extremity Assessment: Defer to PT evaluation;Generalized weakness       Communication Communication Communication: Expressive difficulties;Other  (comment)(pt's husband provided information for occupational profile)   Cognition Arousal/Alertness: Awake/alert Behavior During Therapy: WFL for tasks assessed/performed Overall Cognitive Status: Impaired/Different from baseline Area of Impairment: Attention;Following commands                   Current Attention Level: Alternating   Following Commands: Follows one step commands inconsistently       General Comments: Pt's cognition appears to be impaired, with fluctuating levels of attention and command following throughout.  Difficult to assess 2/2 aphasia.   General Comments       Exercises Other Exercises Other Exercises: provided education re: OT role and plan of care, fall and safety precautions, self care, bed mobility   Shoulder Instructions      Home Living Family/patient expects to be discharged to:: Skilled nursing facility Living Arrangements: Other (Comment)(pt came from SNF) Available Help at Discharge: Family;Other (Comment)(spouse has limited ability to provide care for pt due to back problems) Type of Home: House Home Access: Level entry     Home Layout: One level               Home Equipment: Walker - 2 wheels;Walker - 4 wheels          Prior Functioning/Environment Level of Independence: Independent with assistive device(s)        Comments: Prior to several weeks ago, pt was mod I with RW ambulating limited community distances.  Pt was independent in ADLs prior to previous hospital admission several weeks ago.        OT Problem List: Decreased strength;Decreased range of motion;Decreased activity tolerance;Impaired balance (sitting and/or standing);Decreased coordination;Decreased cognition;Decreased safety awareness;Decreased knowledge of use of DME or AE;Decreased knowledge of precautions;Impaired UE functional use      OT Treatment/Interventions: Self-care/ADL training;Therapeutic exercise;Neuromuscular education;Energy  conservation;DME and/or AE instruction;Therapeutic activities;Cognitive remediation/compensation;Patient/family education;Balance training    OT Goals(Current goals can be found in the care plan section) Acute Rehab OT Goals Patient Stated Goal: to return to PLOF OT Goal Formulation: With patient/family Time For Goal Achievement: 09/11/19 Potential to Achieve Goals: Fair  OT Frequency: Min 2X/week   Barriers to D/C: Decreased caregiver support          Co-evaluation              AM-PAC OT "6 Clicks" Daily Activity     Outcome Measure Help from another person eating meals?: A Lot Help from another person taking care of personal grooming?: Total Help from another person toileting, which includes using toliet, bedpan, or urinal?: Total Help from another person bathing (including washing, rinsing, drying)?: Total Help from another person to put on and taking off regular upper body clothing?: A Lot Help from another person to put on and taking off regular lower body clothing?: Total 6 Click Score: 8   End of Session Nurse Communication: Patient requests pain meds  Activity Tolerance: Patient limited by fatigue;Patient limited by pain Patient left: in bed;with call bell/phone within reach;with bed alarm set;with family/visitor present  OT Visit Diagnosis: Unsteadiness on feet (R26.81);Muscle weakness (generalized) (M62.81)  Time: 5789-7847 OT Time Calculation (min): 35 min Charges:  OT General Charges $OT Visit: 1 Visit OT Evaluation $OT Eval Moderate Complexity: 1 Mod OT Treatments $Self Care/Home Management : 23-37 mins  Kathyrn Drown Jaykwon Morones, OTR/L 08/28/19, 4:49 PM

## 2019-08-29 LAB — BASIC METABOLIC PANEL
Anion gap: 8 (ref 5–15)
BUN: 27 mg/dL — ABNORMAL HIGH (ref 8–23)
CO2: 21 mmol/L — ABNORMAL LOW (ref 22–32)
Calcium: 8.4 mg/dL — ABNORMAL LOW (ref 8.9–10.3)
Chloride: 124 mmol/L — ABNORMAL HIGH (ref 98–111)
Creatinine, Ser: 0.86 mg/dL (ref 0.44–1.00)
GFR calc Af Amer: >60 mL/min (ref >60)
GFR calc non Af Amer: >60 mL/min (ref >60)
Glucose, Bld: 121 mg/dL — ABNORMAL HIGH (ref 70–99)
Potassium: 2.9 mmol/L — ABNORMAL LOW (ref 3.5–5.1)
Sodium: 153 mmol/L — ABNORMAL HIGH (ref 135–145)

## 2019-08-29 LAB — PROTIME-INR
INR: 1.5 — ABNORMAL HIGH (ref 0.8–1.2)
Prothrombin Time: 17.2 seconds — ABNORMAL HIGH (ref 11.4–15.2)

## 2019-08-29 MED ORDER — POTASSIUM CL IN DEXTROSE 5% 20 MEQ/L IV SOLN
20.0000 meq | INTRAVENOUS | Status: DC
  Administered 2019-08-29 – 2019-08-30 (×2): 20 meq via INTRAVENOUS
  Filled 2019-08-29 (×3): qty 1000

## 2019-08-29 MED ORDER — RIVAROXABAN 20 MG PO TABS
20.0000 mg | ORAL_TABLET | Freq: Every day | ORAL | Status: DC
  Administered 2019-08-29 – 2019-09-09 (×12): 20 mg via ORAL
  Filled 2019-08-29 (×12): qty 1

## 2019-08-29 MED ORDER — POTASSIUM CHLORIDE 20 MEQ PO PACK
20.0000 meq | PACK | Freq: Three times a day (TID) | ORAL | Status: AC
  Administered 2019-08-29 (×3): 20 meq via ORAL
  Filled 2019-08-29 (×3): qty 1

## 2019-08-29 NOTE — Progress Notes (Signed)
ID Pt stable Awake, alert, still has expressive aphasia, some intermittent confusion Husband at be bed side Says he brought her glass as she has severe myopia S/S seen her and advised finely chopped diet, liquids Patient Vitals for the past 24 hrs:  BP Temp Temp src Pulse Resp SpO2 Weight  08/29/19 1537 (!) 143/81 98.3 F (36.8 C) -- 100 17 99 % --  08/29/19 1131 (!) 151/94 97.6 F (36.4 C) -- 92 18 98 % --  08/29/19 0731 (!) 157/86 97.8 F (36.6 C) -- 95 18 98 % --  08/29/19 0300 -- -- -- -- 19 -- --  08/29/19 0245 (!) 162/92 98.5 F (36.9 C) Oral 98 (!) 21 100 % 80.5 kg  08/29/19 0200 -- -- -- -- 18 -- --  08/28/19 1922 134/75 97.8 F (36.6 C) Oral 98 (!) 24 99 % --  08/28/19 1557 132/69 98.1 F (36.7 C) Oral (!) 102 17 98 % --   Awake, alert but a little confusion persisit Expressive aphasia Chest b/l air entry Hss 1s2 Abd soft Rt hemiparesis Edema legs  CBC Latest Ref Rng & Units 08/28/2019 08/27/2019 08/26/2019  WBC 4.0 - 10.5 K/uL 19.8(H) 14.4(H) 10.2  Hemoglobin 12.0 - 15.0 g/dL 4.1(P) 3.7(T) 0.2(I)  Hematocrit 36.0 - 46.0 % 30.7(L) 30.6(L) 29.5(L)  Platelets 150 - 400 K/uL 562(H) 523(H) 484(H)    CMP Latest Ref Rng & Units 08/29/2019 08/28/2019 08/27/2019  Glucose 70 - 99 mg/dL 097(D) - 532(D)  BUN 8 - 23 mg/dL 92(E) - 12  Creatinine 0.44 - 1.00 mg/dL 2.68 3.41 9.62  Sodium 135 - 145 mmol/L 153(H) - 152(H)  Potassium 3.5 - 5.1 mmol/L 2.9(L) - 3.6  Chloride 98 - 111 mmol/L 124(H) - 123(H)  CO2 22 - 32 mmol/L 21(L) - 22  Calcium 8.9 - 10.3 mg/dL 2.2(L) - 8.2(L)  Total Protein 6.5 - 8.1 g/dL - - -  Total Bilirubin 0.3 - 1.2 mg/dL - - -  Alkaline Phos 38 - 126 U/L - - -  AST 15 - 41 U/L - - -  ALT 0 - 44 U/L - - -    Impression/recommendation 69 y.o.femalewith a history ofhypopituitarism secondary to lymphocytichypophysitis ( neurosarcoid ), secondary adrenal insufficiency and secondary hypothyroidism followed at [facility], DVT, CVA 2009 with unruptured left  posterior communicating artery aneurysm which was coiled rt hemiplegia and baseline aphasia , mixed incontinence, UTIs  Admitted withaltered mental status and fever from [facility] 08/21/2019. ? ?Fever and encephalopathy initially due to Urosepsis due to enterococcus faecium-  As both the encephalopathy and fever persisted in spite of appropriate antibiotics it was thought to be either due to adrenal crisis ( from not getting stress dose steroids when acutely ill) or CNS infection  On Monday 08/26/19 We started her on stress dose steroids and the fever has resolved and encephalopathy has resolved She now has expressive dysphasia and some confusion which could be near baseline. We will not proceed with LP as the risk outweighs any benefit and we dont think she has meningitis No significant residue on bladder scan ( 100cc)  Current leucocytosis is from stress dose steroids Pt has completed 7 days of IV antibioticsand procalcitonin has declined from 37 to 0.7  we will discontinue ceftriaxone now and vanco after today's dose  and see her progress    Enterococcus UTI in the nursing home and she was on linezolid from 08/20/2019. Could she have had serotonin syndrome secondary to drug -drug interactions with fluoxetine tizanidine tramadoland  linezolid  Abnormal INR and PTT. On admission the INR was 7 and PTT was 58.4. Unclear reason. Patient was only on Xarelto.  Transaminitison admission.improving ? Medication? Infection  Normal bilirubin and ammonia.  Hypoalbuminemia due to poor nutrition  AKI on admission has resolved  Hypernatremia.she needs more free water  Hypokalemia    CVA in 2009 due to left MCA infarct secondary to left unruptured posterior communicating aneurysm needing coiling- now has chronic encephalomalacia on the left side Also has expressive aphasia. rt hemiplegia because of that.   Secondary hypothyroidism on Synthroid. Her normal dose of 75  mcg/day  TSH less than 0.10,( low) [id number] which is low and free T-4 is 0.91( normal). Not clear how to interpret these results. Could be from the acute illness??.   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 _History of DVT on Xarelto  Discussed in great detail the management plan with Mr.Horton her husband

## 2019-08-29 NOTE — Progress Notes (Signed)
  Speech Language Pathology Treatment: Dysphagia  Patient Details Name: Morgan Carpenter MRN: [id number] DOB: [birth year] Today's Date: 08/29/2019 Time: 1015-1100 SLP Time Calculation (min) (ACUTE ONLY): 45 min  Assessment / Plan / Recommendation Clinical Impression  Skilled ST services today included clinical observations and caregiver education. Husband present at bedside for today's treatment session. Given moderate verbal/visual/tactile cues and postural modifications in the bed, patient consumed x4-5 small bites of soft solids and ~2oz of thin liquids via straw (single sips) demonstrating cognitive feeding deficits, no anterior oral spillage, suspected manipulation/lateralization of soft solids in oral cavity for 30-60 seconds per bite with noted laryngeal movement. No coughing/choking and/or changes in vocal quality noted during trials with SLP. Recommendation includes Fine Chop (NDD2) and Thin liquid diet via straw (single sips) with 100% supervision/assistance with meals.  Thorough education provided to husband re: diet recommendation, aspiration precautions, and compensatory strategy of single straw sips when assisting patient during meals. Husband verbalized understanding and agreement. During practice trial, husband did provide multiple sips through the straw and patient demonstrated x1 delayed coughing episode - additional education provided following incident. Education also provided re: s/s aspiration to monitor during meals. Consider objective testing via MBSS/FEES if warranted following diet upgrade to r/o oropharyngeal dysphagia.   Of note, husband stated that he overall cognitive status and communication has "improved", but not at baseline. Per husband, patient had hx of stroke and dx of aphasia with ST services. Prior to this hospitalization, however, she was able to talk on the phone and express most wants/needs. Husband stated that the neurologist is still determining differential  diagnosis in terms of the causes of her current cognitive state, although some of the tx methods have already vastly improved her overall alertness/communication. D/t time constraints a full standardized test was unable to be performed today. Will complete cog-ling assessment prior to d/c if appropriate. SLP recommends follow-up skilled ST services to address expressive/receptive language and cognitive-communication deficits.     HPI HPI: Pt is a 69 y.o. female African-American female with a known history of Hypopituitarism, hemiplegia, DDD, DVT, sarcoidosis, obstructive sleep apnea, hypopituitarism on low dose Prednisone, dyslipidemia and CVA with residual right hemiparesis, who presented to the emergency room with acute onset of altered mental status with incoherence and confusion.  The patient was diagnosed with UTI.  Patient febrile with elevated white blood cell count, procalcitonin and evidence of renal insufficiency above baseline. She resides at [facility]. She was admitted w/ "more confused and altered than her baseline" per chart note.      SLP Plan  Continue with current plan of care       Recommendations  Diet recommendations: Dysphagia 2 (fine chop);Thin liquid Liquids provided via: Cup;Straw Medication Administration: Other (Comment)(Per MD/RN/patient/family preference) Compensations: Minimize environmental distractions;Slow rate;Small sips/bites Postural Changes and/or Swallow Maneuvers: Seated upright 90 degrees                Oral Care Recommendations: Oral care before and after PO SLP Visit Diagnosis: Dysphagia, oral phase (R13.11) Plan: Continue with current plan of care       Morgan Carpenter, M.S. CCC-SLP Speech-Language Pathologist                 Morgan Carpenter 08/29/2019, 11:18 AM

## 2019-08-29 NOTE — Progress Notes (Signed)
Patient ID: Morgan Carpenter, female   DOB: Sep 15, 1950, 69 y.o.   MRN: [id number] [facility] PROGRESS NOTE  Morgan Carpenter:[id number] DOB: [birth year] DOA: 08/21/2019 PCP: Michail Sermon, MD  HPI/Subjective: Patient able to answer some yes or no questions.  Unable to put together more than a few words together at a time.  Does not feel well but cannot elaborate.  Objective: Vitals:   08/29/19 1131 08/29/19 1537  BP: (!) 151/94 (!) 143/81  Pulse: 92 100  Resp: 18 17  Temp: 97.6 F (36.4 C) 98.3 F (36.8 C)  SpO2: 98% 99%    Intake/Output Summary (Last 24 hours) at 08/29/2019 1801 Last data filed at 08/29/2019 1400 Gross per 24 hour  Intake 240 ml  Output 650 ml  Net -410 ml   Filed Weights   08/23/19 0525 08/25/19 0334 08/29/19 0245  Weight: 84 kg 84.4 kg 80.5 kg    ROS: Review of Systems  Unable to perform ROS: Acuity of condition  Respiratory: Negative for shortness of breath.   Cardiovascular: Negative for chest pain.  Gastrointestinal: Negative for abdominal pain.  Neurological: Negative for headaches.   Exam: Physical Exam  HENT:  Nose: No mucosal edema.  Mouth/Throat: No oropharyngeal exudate or posterior oropharyngeal edema.  Eyes: Pupils are equal, round, and reactive to light. Conjunctivae and lids are normal.  Neck: Carotid bruit is not present.  Cardiovascular: S1 normal, S2 normal and normal heart sounds.  Respiratory: She has decreased breath sounds in the right lower field and the left lower field. She has no wheezes. She has no rhonchi. She has no rales.  GI: Soft. Bowel sounds are normal. There is no abdominal tenderness.  Musculoskeletal:     Right ankle: Swelling present.     Left ankle: Swelling present.  Neurological: She is alert.  Unable to straight leg raise but able to flex and extend at the ankle.  Legs very stiff.  Able to lift both arms up.  Follows some simple commands.  Able to answer some yes or no questions but unable to get  sentences together at this point  Skin: Skin is warm. Nails show no clubbing.  Psychiatric:  Answers some yes or no questions but unable to elaborate      Data Reviewed: Basic Metabolic Panel: Recent Labs  Lab 08/24/19 0551 08/24/19 0551 08/25/19 0607 08/26/19 0340 08/27/19 0716 08/28/19 0526 08/29/19 0838  NA 148*  --  154* 155* 152*  --  153*  K 3.4*  --  3.5 2.9* 3.6  --  2.9*  CL 116*  --  124* 125* 123*  --  124*  CO2 22  --  22 22 22   --  21*  GLUCOSE 95  --  96 83 146*  --  121*  BUN 14  --  12 9 12   --  27*  CREATININE 0.99   < > 0.92 0.96 0.79 0.92 0.86  CALCIUM 8.5*  --  8.7* 8.2* 8.2*  --  8.4*   < > = values in this interval not displayed.   Liver Function Tests: Recent Labs  Lab 08/26/19 0340  AST 76*  ALT 45*  ALKPHOS 61  BILITOT 0.7  PROT 5.3*  ALBUMIN 2.1*    Recent Labs  Lab 08/24/19 1411  AMMONIA 11   CBC: Recent Labs  Lab 08/24/19 0551 08/25/19 0607 08/26/19 0340 08/27/19 0716 08/28/19 0526  WBC 10.3 10.0 10.2 14.4* 19.8*  NEUTROABS 6.5 6.5 5.1 12.2* 17.2*  HGB 9.6* 10.1* 9.7* 9.7* 9.9*  HCT 30.5* 30.8* 29.5* 30.6* 30.7*  MCV 87.4 84.6 84.3 86.9 84.8  PLT 511* 486* 484* 523* 562*     Recent Results (from the past 240 hour(s))  Blood culture (routine x 2)     Status: None   Collection Time: 08/21/19 11:24 PM   Specimen: Right Antecubital; Blood  Result Value Ref Range Status   Specimen Description RIGHT ANTECUBITAL  Final   Special Requests   Final    BOTTLES DRAWN AEROBIC AND ANAEROBIC Blood Culture results may not be optimal due to an excessive volume of blood received in culture bottles   Culture   Final    NO GROWTH 5 DAYS Performed at [facility], [address] [place], [place] [address]    Report Status 08/27/2019 FINAL  Final  Blood culture (routine x 2)     Status: None   Collection Time: 08/21/19 11:29 PM   Specimen: BLOOD RIGHT WRIST  Result Value Ref Range Status   Specimen Description BLOOD  RIGHT WRIST  Final   Special Requests   Final    BOTTLES DRAWN AEROBIC AND ANAEROBIC Blood Culture results may not be optimal due to an excessive volume of blood received in culture bottles   Culture   Final    NO GROWTH 5 DAYS Performed at [facility], [address] [place], [place] [address]    Report Status 08/27/2019 FINAL  Final  Urine Culture     Status: Abnormal   Collection Time: 08/22/19 12:46 AM   Specimen: Urine, Catheterized  Result Value Ref Range Status   Specimen Description   Final    URINE, CATHETERIZED Performed at [facility], [address] [place], [place] [address]    Special Requests   Final    NONE Performed at [facility], [address] [place], [place] [address]    Culture 40,000 COLONIES/mL ENTEROCOCCUS FAECIUM (A)  Final   Report Status 08/24/2019 FINAL  Final   Organism ID, Bacteria ENTEROCOCCUS FAECIUM (A)  Final      Susceptibility   Enterococcus faecium - MIC*    AMPICILLIN >=32 RESISTANT Resistant     NITROFURANTOIN 128 RESISTANT Resistant     VANCOMYCIN <=0.5 SENSITIVE Sensitive     * 40,000 COLONIES/mL ENTEROCOCCUS FAECIUM  SARS Coronavirus 2 by RT PCR (hospital order, performed in [place] hospital lab) Nasopharyngeal Nasopharyngeal Swab     Status: None   Collection Time: 08/22/19  1:09 AM   Specimen: Nasopharyngeal Swab  Result Value Ref Range Status   SARS Coronavirus 2 NEGATIVE NEGATIVE Final    Comment: (NOTE) SARS-CoV-2 target nucleic acids are NOT DETECTED. The SARS-CoV-2 RNA is generally detectable in upper and lower respiratory specimens during the acute phase of infection. The lowest concentration of SARS-CoV-2 viral copies this assay can detect is 250 copies / mL. A negative result does not preclude SARS-CoV-2 infection and should not be used as the sole basis for treatment or other patient management decisions.  A negative result may occur with improper specimen collection / handling,  submission of specimen other than nasopharyngeal swab, presence of viral mutation(s) within the areas targeted by this assay, and inadequate number of viral copies (<250 copies / mL). A negative result must be combined with clinical observations, patient history, and epidemiological information. Fact Sheet for Patients:   StrictlyIdeas.no Fact Sheet for Healthcare Providers: BankingDealers.co.za This test is not yet approved or cleared  by the  [place] [employer] trader and has been authorized for detection and/or diagnosis of SARS-CoV-2 by FDA under an TEFL teacher (EUA).  This EUA will remain in effect (meaning this test can be used) for the duration of the COVID-19 declaration under Section 564(b)(1) of the Act, 21 U.S.C. section 360bbb-3(b)(1), unless the authorization is terminated or revoked sooner. Performed at [facility], [address] [place], [place] [address]   MRSA PCR Screening     Status: None   Collection Time: 08/23/19  2:45 AM   Specimen: Nasopharyngeal  Result Value Ref Range Status   MRSA by PCR NEGATIVE NEGATIVE Final    Comment:        The GeneXpert MRSA Assay (FDA approved for NASAL specimens only), is one component of a comprehensive MRSA colonization surveillance program. It is not intended to diagnose MRSA infection nor to guide or monitor treatment for MRSA infections. Performed at [facility], [address] [place], [place] [address]      Studies: No results found.  Scheduled Meds: . atorvastatin  40 mg Oral Daily  . hydrocortisone sod succinate (SOLU-CORTEF) inj  50 mg Intravenous Q6H  . levothyroxine  75 mcg Oral Q0600  . multivitamin with minerals  1 tablet Oral Daily  . pantoprazole  40 mg Oral Daily  . potassium chloride  20 mEq Oral TID   Continuous Infusions: . dextrose 5 % with KCl 20 mEq / L 20 mEq (08/29/19 1120)    Assessment/Plan:   1. Acute  metabolic encephalopathy, expressive aphasia.  MRI of the brain negative for acute infarct.  Patient started improving with stress dose steroids.  Patient has a history of pan hypopituitarism.  Patient not back to baseline as per her husband.  Hold Prozac at this time also. 2. Acute cystitis.  Treated with antibiotics.  Completed course. 3. Severe sepsis, present on admission.  Enterococcus faecalis on UTI 4. Stage II sacral decubitus present on admission see description below 5. Hypothyroidism unspecified.  TSH low.  On levothyroxine. 6. Hyperlipidemia on statin 7. ADHD.  Adderall held. 8. Weakness.  PT and OT consultations 9. Recurrent DVT.  History of stroke restart Xarelto since LP is not going to be done. 10. Hypernatremia and hypokalemia.  Give D5W with KCl to bring down sodium and increase potassium  Pressure Injury 08/22/19 Buttocks Right;Left Stage 2 -  Partial thickness loss of dermis presenting as a shallow open injury with a red, pink wound bed without slough. (Active)  08/22/19 2100  Location: Buttocks  Location Orientation: Right;Left  Staging: Stage 2 -  Partial thickness loss of dermis presenting as a shallow open injury with a red, pink wound bed without slough.  Wound Description (Comments):   Present on Admission: Yes       Code Status:     Code Status Orders  (From admission, onward)         Start     Ordered   08/22/19 0159  Full code  Continuous     08/22/19 0200        Code Status History    Date Active Date Inactive Code Status Order ID Comments User Context   08/01/2019 1143 08/09/2019 2221 Full Code [id number]  Lucile Shutters, MD ED   Advance Care Planning Activity     Family Communication: Spoke with husband on the phone Disposition Plan: Status is: Inpatient  Dispo: The patient is from: Rehab  Anticipated d/c is to: Rehab              Anticipated d/c date is: Likely will need at least another 3- 4 days here in the hospital               Patient currently medically not stable to go out to rehab at this point.  Would like to get her mental status better prior to disposition.  Patient will need to eat better or else prognosis is not can to be good.  Consultants:  Neurology  Infectious disease  Antibiotics:  Vancomycin  Time spent: 27 minutes  [employer]

## 2019-08-29 NOTE — Care Management Important Message (Signed)
Important Message  Patient Details  Name: Morgan Carpenter MRN: [id number] Date of Birth: 06-10-1950   Medicare Important Message Given:  Yes     [name] 08/29/2019, 3:25 PM

## 2019-08-29 NOTE — Progress Notes (Signed)
PT Cancellation Note  Patient Details Name: Morgan Carpenter MRN: [id number] DOB: 16-Dec-1950   Cancelled Treatment:    Reason Eval/Treat Not Completed: Medical issues which prohibited therapy(Per chart review, Na+:153, K+: 2.9. Will hold PT evaluation until medically appropriate per facility guidelines.)  11:48 AM, 08/29/19 Rosamaria Lints, PT, DPT Physical Therapist - [facility]  [phone] (ASCOM)    [name] C 08/29/2019, 11:48 AM

## 2019-08-29 NOTE — Consult Note (Signed)
ANTICOAGULATION CONSULT NOTE  Pharmacy Consult for Xarelto  Indication: atrial fibrillation  Allergies  Allergen Reactions  . Naprosyn [Naproxen] Hives  . Shellfish Allergy Hives  . Sulfa Antibiotics Hives    Patient Measurements: Height: 5\' 4"  (162.6 cm) Weight: 80.5 kg (177 lb 8 oz) IBW/kg (Calculated) : 54.7   Vital Signs: Temp: 98.3 F (36.8 C) (05/27 1537) BP: 143/81 (05/27 1537) Pulse Rate: 100 (05/27 1537)  Labs: Recent Labs    08/27/19 0716 08/28/19 0526 08/29/19 0529 08/29/19 0838  HGB 9.7* 9.9*  --   --   HCT 30.6* 30.7*  --   --   PLT 523* 562*  --   --   LABPROT 27.1* 21.7* 17.2*  --   INR 2.6* 2.0* 1.5*  --   CREATININE 0.79 0.92  --  0.86    Estimated Creatinine Clearance: 65.1 mL/min (by C-G formula based on SCr of 0.86 mg/dL).   Medical History: Past Medical History:  Diagnosis Date  . Adrenal insufficiency (HCC)   . DDD (degenerative disc disease), cervical   . DDD (degenerative disc disease), lumbar   . Hemiplegia (HCC)   . Hypercholesteremia   . Hyperprolactinemia (HCC)   . Hypopituitarism (HCC)   . Lymphocytic hypophysitis (HCC)   . OSA (obstructive sleep apnea)   . Patient is Jehovah's Witness   . Recurrent deep vein thrombosis (DVT) (HCC)   . Sarcoidosis   . Stroke (HCC)   . Thyroid disease   . Vitamin D deficiency     Assessment: 69 year old female admitted on 5/19 with sepsis. Patient with history of stroke and atrial fibrillation. Pharmacy consulted to restart Xarelto.   Goal of Therapy:  Monitor platelets by anticoagulation protocol: Yes   Plan:  Restart Xarelto 20mg  daily with food. Will monitor CBC and Scr in the AM.   6/19  Student-PharmD 08/29/2019,6:08 PM

## 2019-08-30 DIAGNOSIS — E870 Hyperosmolality and hypernatremia: Secondary | ICD-10-CM

## 2019-08-30 DIAGNOSIS — E230 Hypopituitarism: Secondary | ICD-10-CM

## 2019-08-30 DIAGNOSIS — E876 Hypokalemia: Secondary | ICD-10-CM

## 2019-08-30 LAB — BASIC METABOLIC PANEL
Anion gap: 7 (ref 5–15)
BUN: 28 mg/dL — ABNORMAL HIGH (ref 8–23)
CO2: 23 mmol/L (ref 22–32)
Calcium: 8.4 mg/dL — ABNORMAL LOW (ref 8.9–10.3)
Chloride: 124 mmol/L — ABNORMAL HIGH (ref 98–111)
Creatinine, Ser: 0.74 mg/dL (ref 0.44–1.00)
GFR calc Af Amer: >60 mL/min (ref >60)
GFR calc non Af Amer: >60 mL/min (ref >60)
Glucose, Bld: 129 mg/dL — ABNORMAL HIGH (ref 70–99)
Potassium: 3.2 mmol/L — ABNORMAL LOW (ref 3.5–5.1)
Sodium: 154 mmol/L — ABNORMAL HIGH (ref 135–145)

## 2019-08-30 LAB — PHOSPHORUS: Phosphorus: 2.4 mg/dL — ABNORMAL LOW (ref 2.5–4.6)

## 2019-08-30 LAB — MAGNESIUM: Magnesium: 2.3 mg/dL (ref 1.7–2.4)

## 2019-08-30 LAB — CBC
HCT: 32 % — ABNORMAL LOW (ref 36.0–46.0)
Hemoglobin: 10 g/dL — ABNORMAL LOW (ref 12.0–15.0)
MCH: 27.6 pg (ref 26.0–34.0)
MCHC: 31.3 g/dL (ref 30.0–36.0)
MCV: 88.4 fL (ref 80.0–100.0)
Platelets: 553 10*3/uL — ABNORMAL HIGH (ref 150–400)
RBC: 3.62 MIL/uL — ABNORMAL LOW (ref 3.87–5.11)
RDW: 19.1 % — ABNORMAL HIGH (ref 11.5–15.5)
WBC: 15.9 10*3/uL — ABNORMAL HIGH (ref 4.0–10.5)
nRBC: 0.2 % (ref 0.0–0.2)

## 2019-08-30 MED ORDER — POTASSIUM CHLORIDE 20 MEQ PO PACK
20.0000 meq | PACK | Freq: Two times a day (BID) | ORAL | Status: DC
  Administered 2019-08-30 – 2019-09-02 (×7): 20 meq via ORAL
  Filled 2019-08-30 (×7): qty 1

## 2019-08-30 MED ORDER — HYDROCORTISONE NA SUCCINATE PF 100 MG IJ SOLR
50.0000 mg | Freq: Two times a day (BID) | INTRAMUSCULAR | Status: DC
  Administered 2019-08-30 – 2019-08-31 (×2): 50 mg via INTRAVENOUS
  Filled 2019-08-30 (×3): qty 1

## 2019-08-30 MED ORDER — POTASSIUM CL IN DEXTROSE 5% 20 MEQ/L IV SOLN
20.0000 meq | INTRAVENOUS | Status: DC
  Administered 2019-08-30 – 2019-09-02 (×7): 20 meq via INTRAVENOUS
  Filled 2019-08-30 (×8): qty 1000

## 2019-08-30 NOTE — Progress Notes (Signed)
Patient ID: STORIE HEFFERN, female   DOB: 01/20/1951, 69 y.o.   MRN: [id number] [facility] PROGRESS NOTE  JAMISEN HAWES PQZ:[id number] DOB: 27-Aug-1950 DOA: 08/21/2019 PCP: Michail Sermon, MD  HPI/Subjective: Patient able to stringing some words together today.  She is talking a little bit better.  I encouraged her to eat.  Objective: Vitals:   08/30/19 1158 08/30/19 1536  BP: (!) 155/89 (!) 157/91  Pulse: 96 89  Resp: 19 19  Temp: 97.8 F (36.6 C) 97.8 F (36.6 C)  SpO2: 99% 100%    Intake/Output Summary (Last 24 hours) at 08/30/2019 1557 Last data filed at 08/30/2019 1300 Gross per 24 hour  Intake 344.18 ml  Output 1200 ml  Net -855.82 ml   Filed Weights   08/25/19 0334 08/29/19 0245 08/30/19 0506  Weight: 84.4 kg 80.5 kg 80.1 kg    ROS: Review of Systems  Unable to perform ROS: Acuity of condition  Respiratory: Negative for shortness of breath.   Cardiovascular: Negative for chest pain.  Gastrointestinal: Negative for abdominal pain.  Neurological: Negative for headaches.   Exam: Physical Exam  HENT:  Nose: No mucosal edema.  Mouth/Throat: No oropharyngeal exudate or posterior oropharyngeal edema.  Eyes: Pupils are equal, round, and reactive to light. Conjunctivae and lids are normal.  Neck: Carotid bruit is not present.  Cardiovascular: S1 normal, S2 normal and normal heart sounds.  Respiratory: She has decreased breath sounds in the right lower field and the left lower field. She has no wheezes. She has no rhonchi. She has no rales.  GI: Soft. Bowel sounds are normal. There is no abdominal tenderness.  Musculoskeletal:     Right ankle: Swelling present.     Left ankle: Swelling present.  Neurological: She is alert.  Unable to straight leg raise but able to flex and extend at the ankle.  Legs very stiff.  Able to lift both arms up.  Follows some simple commands.  Able to string a few more words together than previously.  Skin: Skin is warm. Nails show no  clubbing.  Psychiatric:  Patient able to put a few more words together today than previous days      Data Reviewed: Basic Metabolic Panel: Recent Labs  Lab 08/25/19 0607 08/25/19 0607 08/26/19 0340 08/27/19 0716 08/28/19 0526 08/29/19 0838 08/30/19 0543  NA 154*  --  155* 152*  --  153* 154*  K 3.5  --  2.9* 3.6  --  2.9* 3.2*  CL 124*  --  125* 123*  --  124* 124*  CO2 22  --  22 22  --  21* 23  GLUCOSE 96  --  83 146*  --  121* 129*  BUN 12  --  9 12  --  27* 28*  CREATININE 0.92   < > 0.96 0.79 0.92 0.86 0.74  CALCIUM 8.7*  --  8.2* 8.2*  --  8.4* 8.4*  MG  --   --   --   --   --   --  2.3  PHOS  --   --   --   --   --   --  2.4*   < > = values in this interval not displayed.   Liver Function Tests: Recent Labs  Lab 08/26/19 0340  AST 76*  ALT 45*  ALKPHOS 61  BILITOT 0.7  PROT 5.3*  ALBUMIN 2.1*    Recent Labs  Lab 08/24/19 1411  AMMONIA 11  CBC: Recent Labs  Lab 08/24/19 0551 08/24/19 0551 08/25/19 0607 08/26/19 0340 08/27/19 0716 08/28/19 0526 08/30/19 0543  WBC 10.3   < > 10.0 10.2 14.4* 19.8* 15.9*  NEUTROABS 6.5  --  6.5 5.1 12.2* 17.2*  --   HGB 9.6*   < > 10.1* 9.7* 9.7* 9.9* 10.0*  HCT 30.5*   < > 30.8* 29.5* 30.6* 30.7* 32.0*  MCV 87.4   < > 84.6 84.3 86.9 84.8 88.4  PLT 511*   < > 486* 484* 523* 562* 553*   < > = values in this interval not displayed.     Recent Results (from the past 240 hour(s))  Blood culture (routine x 2)     Status: None   Collection Time: 08/21/19 11:24 PM   Specimen: Right Antecubital; Blood  Result Value Ref Range Status   Specimen Description RIGHT ANTECUBITAL  Final   Special Requests   Final    BOTTLES DRAWN AEROBIC AND ANAEROBIC Blood Culture results may not be optimal due to an excessive volume of blood received in culture bottles   Culture   Final    NO GROWTH 5 DAYS Performed at [facility], [address] [place], [place] [address]    Report Status 08/27/2019 FINAL  Final  Blood  culture (routine x 2)     Status: None   Collection Time: 08/21/19 11:29 PM   Specimen: BLOOD RIGHT WRIST  Result Value Ref Range Status   Specimen Description BLOOD RIGHT WRIST  Final   Special Requests   Final    BOTTLES DRAWN AEROBIC AND ANAEROBIC Blood Culture results may not be optimal due to an excessive volume of blood received in culture bottles   Culture   Final    NO GROWTH 5 DAYS Performed at [facility], [address], [place], [place] [address]    Report Status 08/27/2019 FINAL  Final  Urine Culture     Status: Abnormal   Collection Time: 08/22/19 12:46 AM   Specimen: Urine, Catheterized  Result Value Ref Range Status   Specimen Description   Final    URINE, CATHETERIZED Performed at [facility], [address] [place], [place] [address]    Special Requests   Final    NONE Performed at [facility], [address] [place], [place] [address]    Culture 40,000 COLONIES/mL ENTEROCOCCUS FAECIUM (A)  Final   Report Status 08/24/2019 FINAL  Final   Organism ID, Bacteria ENTEROCOCCUS FAECIUM (A)  Final      Susceptibility   Enterococcus faecium - MIC*    AMPICILLIN >=32 RESISTANT Resistant     NITROFURANTOIN 128 RESISTANT Resistant     VANCOMYCIN <=0.5 SENSITIVE Sensitive     * 40,000 COLONIES/mL ENTEROCOCCUS FAECIUM  SARS Coronavirus 2 by RT PCR (hospital order, performed in [facility] hospital lab) Nasopharyngeal Nasopharyngeal Swab     Status: None   Collection Time: 08/22/19  1:09 AM   Specimen: Nasopharyngeal Swab  Result Value Ref Range Status   SARS Coronavirus 2 NEGATIVE NEGATIVE Final    Comment: (NOTE) SARS-CoV-2 target nucleic acids are NOT DETECTED. The SARS-CoV-2 RNA is generally detectable in upper and lower respiratory specimens during the acute phase of infection. The lowest concentration of SARS-CoV-2 viral copies this assay can detect is 250 copies / mL. A negative result does not preclude SARS-CoV-2 infection and  should not be used as the sole basis for treatment or other patient management decisions.  A  negative result may occur with improper specimen collection / handling, submission of specimen other than nasopharyngeal swab, presence of viral mutation(s) within the areas targeted by this assay, and inadequate number of viral copies (<250 copies / mL). A negative result must be combined with clinical observations, patient history, and epidemiological information. Fact Sheet for Patients:   StrictlyIdeas.no Fact Sheet for Healthcare Providers: BankingDealers.co.za This test is not yet approved or cleared  by the [place] FDA and has been authorized for detection and/or diagnosis of SARS-CoV-2 by FDA under an Emergency Use Authorization (EUA).  This EUA will remain in effect (meaning this test can be used) for the duration of the COVID-19 declaration under Section 564(b)(1) of the Act, 21 U.S.C. section 360bbb-3(b)(1), unless the authorization is terminated or revoked sooner. Performed at [facility], [address] [place], [place] [address]   MRSA PCR Screening     Status: None   Collection Time: 08/23/19  2:45 AM   Specimen: Nasopharyngeal  Result Value Ref Range Status   MRSA by PCR NEGATIVE NEGATIVE Final    Comment:        The GeneXpert MRSA Assay (FDA approved for NASAL specimens only), is one component of a comprehensive MRSA colonization surveillance program. It is not intended to diagnose MRSA infection nor to guide or monitor treatment for MRSA infections. Performed at [facility], [address] [place], [place] [address]      Scheduled Meds: . atorvastatin  40 mg Oral Daily  . hydrocortisone sod succinate (SOLU-CORTEF) inj  50 mg Intravenous Q12H  . levothyroxine  75 mcg Oral Q0600  . multivitamin with minerals  1 tablet Oral Daily  . pantoprazole  40 mg Oral Daily  . potassium chloride  20  mEq Oral BID  . rivaroxaban  20 mg Oral Q supper   Continuous Infusions: . dextrose 5 % with KCl 20 mEq / L 20 mEq (08/30/19 1003)    Assessment/Plan:   1. Hypernatremia and hypokalemia.  Increase D5W with KCl to bring down sodium and increase potassium.  I encouraged the patient to eat.  Speech therapy following.  Patient on dysphagia 3 diet with thin liquids.  If the patient does not eat where having another conversation.   2. Acute metabolic encephalopathy, expressive aphasia.  MRI of the brain negative for acute infarct.  Patient started improving with stress dose steroids.  Start to taper steroids to twice daily dosing patient has a history of pan hypopituitarism.  Patient's husband to evaluate her today.. Hold Prozac at this time also. 3. Acute cystitis.  Treated with antibiotics.  Completed course. 4. Severe sepsis, present on admission.  Enterococcus faecalis on UTI 5. Stage II sacral decubitus present on admission see description below 6. Hypothyroidism unspecified.  TSH low.  On levothyroxine. 7. Hyperlipidemia on statin 8. ADHD.  Adderall held. 9. Weakness.  PT and OT consultations 10. Recurrent DVT.  On Xarelto  Pressure Injury 08/22/19 Buttocks Right;Left Stage 2 -  Partial thickness loss of dermis presenting as a shallow open injury with a red, pink wound bed without slough. (Active)  08/22/19 2100  Location: Buttocks  Location Orientation: Right;Left  Staging: Stage 2 -  Partial thickness loss of dermis presenting as a shallow open injury with a red, pink wound bed without slough.  Wound Description (Comments):   Present on Admission: Yes       Code Status:     Code Status Orders  (From admission, onward)  Start     Ordered   08/22/19 0159  Full code  Continuous     08/22/19 0200        Code Status History    Date Active Date Inactive Code Status Order ID Comments User Context   08/01/2019 1143 08/09/2019 2221 Full Code [id number]  Lucile Shutters,  MD ED   Advance Care Planning Activity     Family Communication: Spoke with husband on the phone Disposition Plan: Status is: Inpatient  Dispo: The patient is from: Rehab              Anticipated d/c is to: Rehab              Anticipated d/c date is: Earliest potential will likely be 09/03/2019.              Patient currently medically not stable to go out to rehab at this point.  The patient needs to eat better since her sodium is still high and potassium is still low the patient will have to eat better in order to maintain her nutritional status.  If she does not eat then we will have another conversation.  Consultants:  Neurology  Infectious disease  Time spent: 26 minutes  [employer]

## 2019-08-30 NOTE — Plan of Care (Signed)
Pt ate full meal that was pureed instead of minced; fed by husband.   Problem: Urinary Elimination: Goal: Signs and symptoms of infection will decrease 08/30/2019 1950 by Phylis Bougie, RN Outcome: Progressing 08/30/2019 1948 by Phylis Bougie, RN Outcome: Progressing   Problem: Education: Goal: Knowledge of General Education information will improve Description: Including pain rating scale, medication(s)/side effects and non-pharmacologic comfort measures 08/30/2019 1950 by Phylis Bougie, RN Outcome: Progressing 08/30/2019 1948 by Phylis Bougie, RN Outcome: Progressing   Problem: Health Behavior/Discharge Planning: Goal: Ability to manage health-related needs will improve 08/30/2019 1950 by Phylis Bougie, RN Outcome: Progressing 08/30/2019 1948 by Phylis Bougie, RN Outcome: Progressing   Problem: Clinical Measurements: Goal: Ability to maintain clinical measurements within normal limits will improve 08/30/2019 1950 by Phylis Bougie, RN Outcome: Progressing 08/30/2019 1948 by Phylis Bougie, RN Outcome: Progressing Goal: Will remain free from infection 08/30/2019 1950 by Phylis Bougie, RN Outcome: Progressing 08/30/2019 1948 by Phylis Bougie, RN Outcome: Progressing Goal: Diagnostic test results will improve 08/30/2019 1950 by Phylis Bougie, RN Outcome: Progressing 08/30/2019 1948 by Phylis Bougie, RN Outcome: Progressing Goal: Respiratory complications will improve 08/30/2019 1950 by Phylis Bougie, RN Outcome: Progressing 08/30/2019 1948 by Phylis Bougie, RN Outcome: Progressing Goal: Cardiovascular complication will be avoided 08/30/2019 1950 by Phylis Bougie, RN Outcome: Progressing 08/30/2019 1948 by Phylis Bougie, RN Outcome: Progressing   Problem: Nutrition: Goal: Adequate nutrition will be maintained 08/30/2019 1950 by Phylis Bougie, RN Outcome: Progressing 08/30/2019 1948  by Phylis Bougie, RN Outcome: Progressing

## 2019-08-30 NOTE — Progress Notes (Signed)
Physical Therapy Evaluation Patient Details Name: Morgan Carpenter MRN: [id number] DOB: [birth year] Today's Date: 08/30/2019   History of Present Illness  Per MD Note:ose Stoffers  is a 69 y.o. African-American female with a known history of sarcoidosis, obstructive sleep apnea, hypopituitarism, dyslipidemia and CVA, who presented to the emergency room with acute onset of altered mental status with incoherence and confusion lately.  The patient was diagnosed with UTI yesterday and was up apparently placed on outpatient oral antibiotic at [facility] where she resides.  The patient admitted to urinary urgency without frequency or dysuria or hematuria or flank pain.  She denied any cough or wheezing or dyspnea or chest pain.  She is a fairly poor historian due to her altered mental status though.  Her husband was in the ER and just left stating he will get some sleep and be back in the morning.  Clinical Impression  Patient is awake and responds to commands 50% of the time. She indicates with faces that she is having pain but not able to say where. Patient has expressive aphasia. She has 0/5 strength in BLE and poor strength in BUE shoulders. Patient needs max assist for supine <> sit bed mobility and she is not able to tolerate sitting due to increased pain. Patient was living at [facility] and will benefit from skilled PT as a trial to see if she is able to participate in an activities.     Follow Up Recommendations SNF    Equipment Recommendations       Recommendations for Other Services       Precautions / Restrictions Restrictions Weight Bearing Restrictions: No      Mobility  Bed Mobility Overal bed mobility: Needs Assistance Bed Mobility: Supine to Sit;Sit to Supine Rolling: Max assist   Supine to sit: Max assist Sit to supine: Max assist   General bed mobility comments: (patient unable to tolerate sitting due to increased pain)  Transfers                     Ambulation/Gait                Stairs            Wheelchair Mobility    Modified Rankin (Stroke Patients Only)       Balance Overall balance assessment: Needs assistance                                           Pertinent Vitals/Pain Pain Assessment: Faces Faces Pain Scale: Hurts little more Pain Location: (back)    Home Living Family/patient expects to be discharged to:: Unsure Living Arrangements: (SNF)                    Prior Function Level of Independence: Needs assistance   Gait / Transfers Assistance Needed: (unknown)           Hand Dominance        Extremity/Trunk Assessment   Upper Extremity Assessment Upper Extremity Assessment: Defer to OT evaluation    Lower Extremity Assessment Lower Extremity Assessment: RLE deficits/detail;LLE deficits/detail RLE Deficits / Details: (0/5 hip/ knne /ankle) LLE Deficits / Details: (0/5 hip/ knne [facility])       Communication   Communication: Other (comment)  Cognition Arousal/Alertness: Awake/alert Behavior During Therapy: Restless Overall Cognitive Status: No family/caregiver present to determine  baseline cognitive functioning                         Following Commands: Follows one step commands inconsistently              General Comments      Exercises     Assessment/Plan    PT Assessment Patient needs continued PT services  PT Problem List Decreased strength;Decreased mobility       PT Treatment Interventions Therapeutic activities;Therapeutic exercise    PT Goals (Current goals can be found in the Care Plan section)  Acute Rehab PT Goals Patient Stated Goal: no goals stated PT Goal Formulation: Patient unable to participate in goal setting Time For Goal Achievement: 09/09/19 Potential to Achieve Goals: Poor    Frequency Min 2X/week   Barriers to discharge        Co-evaluation               AM-PAC PT "6 Clicks"  Mobility  Outcome Measure Help needed turning from your back to your side while in a flat bed without using bedrails?: Total Help needed moving from lying on your back to sitting on the side of a flat bed without using bedrails?: Total Help needed moving to and from a bed to a chair (including a wheelchair)?: Total Help needed standing up from a chair using your arms (e.g., wheelchair or bedside chair)?: Total Help needed to walk in hospital room?: Total Help needed climbing 3-5 steps with a railing? : Total 6 Click Score: 6    End of Session Equipment Utilized During Treatment: Gait belt Activity Tolerance: Patient limited by pain Patient left: with call bell/phone within reach;with bed alarm set Nurse Communication: Mobility status PT Visit Diagnosis: Muscle weakness (generalized) (M62.81)    Time: 1191-4782 PT Time Calculation (min) (ACUTE ONLY): 15 min   Charges:   PT Evaluation $PT Eval Low Complexity: 1 Low          {  Ezekiel Ina, PT DPT 08/30/2019, 1:33 PM

## 2019-08-30 NOTE — Progress Notes (Signed)
  Speech Language Pathology Treatment: Dysphagia  Patient Details Name: Morgan Carpenter MRN: [id number] DOB: Jan 14, 1951 Today's Date: 08/30/2019 Time: 1010-1030 SLP Time Calculation (min) (ACUTE ONLY): 20 min  Assessment / Plan / Recommendation Clinical Impression  Pt visited after breakfast, noted a few items unopened on her tray. When asked if she wanted more pt nodded and stated yes. Pt tolerated small single sips of thin juice from a straw with no s/s of aspiration. Vocal quality remianed clear throughout tx. Pt did not want to try the Pt needed cues to take single sips. Pt with Aphasia but able to communicate basic needs with extra time and use of yes/no. Review of chart indicated Aphasia at baseline but non family present to determine if Pt has any new deficits. Rec continue with current Dysphagia 2 diet. rec ST eval and treat in hopes of diet upgrade from chopped to mechanical soft (Dys 3) at some point. Also rec further Speech and language assessment to assist with communication if determined that speech has worsened and is not back to baseline. Will attempt to f/u during a meal 1-2 days to ensure toleration of chopped foods.   HPI HPI: Pt is a 69 y.o. female African-American female with a known history of Hypopituitarism, hemiplegia, DDD, DVT, sarcoidosis, obstructive sleep apnea, hypopituitarism on low dose Prednisone, dyslipidemia and CVA with residual right hemiparesis, who presented to the emergency room with acute onset of altered mental status with incoherence and confusion.  The patient was diagnosed with UTI.  Patient febrile with elevated white blood cell count, procalcitonin and evidence of renal insufficiency above baseline. She resides at [facility]. She was admitted w/ "more confused and altered than her baseline" per chart note.      SLP Plan   F/u with toleration of diet ST services at discharge       Recommendations  Diet recommendations: Dysphagia 2 (fine  chop) Liquids provided via: Cup;Straw Medication Administration: Other (Comment) Supervision: Full supervision/cueing for compensatory strategies Compensations: Minimize environmental distractions;Slow rate;Small sips/bites Postural Changes and/or Swallow Maneuvers: Seated upright 90 degrees                SLP Visit Diagnosis: Dysphagia, oral phase (R13.11)       GO                Eather Colas 08/30/2019, 10:31 AM

## 2019-08-31 DIAGNOSIS — F32A Depression, unspecified: Secondary | ICD-10-CM

## 2019-08-31 LAB — BASIC METABOLIC PANEL
Anion gap: 8 (ref 5–15)
BUN: 24 mg/dL — ABNORMAL HIGH (ref 8–23)
CO2: 21 mmol/L — ABNORMAL LOW (ref 22–32)
Calcium: 8.1 mg/dL — ABNORMAL LOW (ref 8.9–10.3)
Chloride: 118 mmol/L — ABNORMAL HIGH (ref 98–111)
Creatinine, Ser: 0.74 mg/dL (ref 0.44–1.00)
GFR calc Af Amer: >60 mL/min (ref >60)
GFR calc non Af Amer: >60 mL/min (ref >60)
Glucose, Bld: 133 mg/dL — ABNORMAL HIGH (ref 70–99)
Potassium: 3.6 mmol/L (ref 3.5–5.1)
Sodium: 147 mmol/L — ABNORMAL HIGH (ref 135–145)

## 2019-08-31 MED ORDER — HYDROCORTISONE NA SUCCINATE PF 100 MG IJ SOLR
25.0000 mg | Freq: Two times a day (BID) | INTRAMUSCULAR | Status: DC
  Administered 2019-08-31 – 2019-09-01 (×2): 25 mg via INTRAVENOUS
  Filled 2019-08-31 (×3): qty 0.5

## 2019-08-31 MED ORDER — FLUOXETINE HCL 10 MG PO CAPS
10.0000 mg | ORAL_CAPSULE | Freq: Every day | ORAL | Status: DC
  Administered 2019-08-31 – 2019-09-10 (×11): 10 mg via ORAL
  Filled 2019-08-31 (×11): qty 1

## 2019-08-31 NOTE — Progress Notes (Signed)
Patient ID: Morgan Carpenter, female   DOB: 10-Aug-1950, 69 y.o.   MRN: [id number] [facility] PROGRESS NOTE  Morgan Carpenter GYI:[id number] DOB: 1950-08-13 DOA: 08/21/2019 PCP: Su Monks, MD  HPI/Subjective: Patient very emotional this morning.  Started crying.  With the nursing staff and myself were encouraging her to eat.  Objective: Vitals:   08/31/19 0724 08/31/19 1138  BP: (!) 180/102 (!) 170/95  Pulse: 89 95  Resp: 19 18  Temp: 98.6 F (37 C) 98.7 F (37.1 C)  SpO2: 99% 96%    Intake/Output Summary (Last 24 hours) at 08/31/2019 1318 Last data filed at 08/31/2019 1016 Gross per 24 hour  Intake 354.27 ml  Output 700 ml  Net -345.73 ml   Filed Weights   08/25/19 0334 08/29/19 0245 08/30/19 0506  Weight: 84.4 kg 80.5 kg 80.1 kg    ROS: Review of Systems  Unable to perform ROS: Acuity of condition  Respiratory: Negative for shortness of breath.   Cardiovascular: Negative for chest pain.  Gastrointestinal: Negative for abdominal pain.  Neurological: Negative for headaches.   Exam: Physical Exam  HENT:  Nose: No mucosal edema.  Mouth/Throat: No oropharyngeal exudate or posterior oropharyngeal edema.  Eyes: Pupils are equal, round, and reactive to light. Conjunctivae and lids are normal.  Neck: Carotid bruit is not present.  Cardiovascular: S1 normal, S2 normal and normal heart sounds.  Respiratory: She has decreased breath sounds in the right lower field and the left lower field. She has no wheezes. She has no rhonchi. She has no rales.  GI: Soft. Bowel sounds are normal. There is no abdominal tenderness.  Musculoskeletal:     Right ankle: Swelling present.     Left ankle: Swelling present.  Neurological: She is alert.  Unable to straight leg raise but able to flex and extend at the ankle.  Legs very stiff.  Able to lift both arms up.  Follows some simple commands.   Skin: Skin is warm. Nails show no clubbing.  Psychiatric:  Patient started crying today.       Data Reviewed: Basic Metabolic Panel: Recent Labs  Lab 08/26/19 0340 08/26/19 0340 08/27/19 0716 08/28/19 0526 08/29/19 0838 08/30/19 0543 08/31/19 0627  NA 155*  --  152*  --  153* 154* 147*  K 2.9*  --  3.6  --  2.9* 3.2* 3.6  CL 125*  --  123*  --  124* 124* 118*  CO2 22  --  22  --  21* 23 21*  GLUCOSE 83  --  146*  --  121* 129* 133*  BUN 9  --  12  --  27* 28* 24*  CREATININE 0.96   < > 0.79 0.92 0.86 0.74 0.74  CALCIUM 8.2*  --  8.2*  --  8.4* 8.4* 8.1*  MG  --   --   --   --   --  2.3  --   PHOS  --   --   --   --   --  2.4*  --    < > = values in this interval not displayed.   Liver Function Tests: Recent Labs  Lab 08/26/19 0340  AST 76*  ALT 45*  ALKPHOS 61  BILITOT 0.7  PROT 5.3*  ALBUMIN 2.1*    Recent Labs  Lab 08/24/19 1411  AMMONIA 11   CBC: Recent Labs  Lab 08/25/19 0607 08/26/19 0340 08/27/19 0716 08/28/19 0526 08/30/19 0543  WBC 10.0 10.2 14.4* 19.8*  15.9*  NEUTROABS 6.5 5.1 12.2* 17.2*  --   HGB 10.1* 9.7* 9.7* 9.9* 10.0*  HCT 30.8* 29.5* 30.6* 30.7* 32.0*  MCV 84.6 84.3 86.9 84.8 88.4  PLT 486* 484* 523* 562* 553*     Recent Results (from the past 240 hour(s))  Blood culture (routine x 2)     Status: None   Collection Time: 08/21/19 11:24 PM   Specimen: Right Antecubital; Blood  Result Value Ref Range Status   Specimen Description RIGHT ANTECUBITAL  Final   Special Requests   Final    BOTTLES DRAWN AEROBIC AND ANAEROBIC Blood Culture results may not be optimal due to an excessive volume of blood received in culture bottles   Culture   Final    NO GROWTH 5 DAYS Performed at [facility], [address] [place], [place] [address]    Report Status 08/27/2019 FINAL  Final  Blood culture (routine x 2)     Status: None   Collection Time: 08/21/19 11:29 PM   Specimen: BLOOD RIGHT WRIST  Result Value Ref Range Status   Specimen Description BLOOD RIGHT WRIST  Final   Special Requests   Final    BOTTLES DRAWN  AEROBIC AND ANAEROBIC Blood Culture results may not be optimal due to an excessive volume of blood received in culture bottles   Culture   Final    NO GROWTH 5 DAYS Performed at [facility], [address] [place], [place] [address]    Report Status 08/27/2019 FINAL  Final  Urine Culture     Status: Abnormal   Collection Time: 08/22/19 12:46 AM   Specimen: Urine, Catheterized  Result Value Ref Range Status   Specimen Description   Final    URINE, CATHETERIZED Performed at [facility], [address] [place], [place] [address]    Special Requests   Final    NONE Performed at [facility], [address] [place], [place] [address]    Culture 40,000 COLONIES/mL ENTEROCOCCUS FAECIUM (A)  Final   Report Status 08/24/2019 FINAL  Final   Organism ID, Bacteria ENTEROCOCCUS FAECIUM (A)  Final      Susceptibility   Enterococcus faecium - MIC*    AMPICILLIN >=32 RESISTANT Resistant     NITROFURANTOIN 128 RESISTANT Resistant     VANCOMYCIN <=0.5 SENSITIVE Sensitive     * 40,000 COLONIES/mL ENTEROCOCCUS FAECIUM  SARS Coronavirus 2 by RT PCR (hospital order, performed in [facility] hospital lab) Nasopharyngeal Nasopharyngeal Swab     Status: None   Collection Time: 08/22/19  1:09 AM   Specimen: Nasopharyngeal Swab  Result Value Ref Range Status   SARS Coronavirus 2 NEGATIVE NEGATIVE Final    Comment: (NOTE) SARS-CoV-2 target nucleic acids are NOT DETECTED. The SARS-CoV-2 RNA is generally detectable in upper and lower respiratory specimens during the acute phase of infection. The lowest concentration of SARS-CoV-2 viral copies this assay can detect is 250 copies / mL. A negative result does not preclude SARS-CoV-2 infection and should not be used as the sole basis for treatment or other patient management decisions.  A negative result may occur with improper specimen collection / handling, submission of specimen other than nasopharyngeal swab, presence of  viral mutation(s) within the areas targeted by this assay, and inadequate number of viral copies (<250 copies / mL). A negative result must be combined with clinical observations, patient history, and epidemiological information. Fact Sheet for Patients:   BoilerBrush.com.cy Fact Sheet for Healthcare Providers: https://pope.com/  This test is not yet approved or cleared  by the [place] and has been authorized for detection and/or diagnosis of SARS-CoV-2 by FDA under an Emergency Use Authorization (EUA).  This EUA will remain in effect (meaning this test can be used) for the duration of the COVID-19 declaration under Section 564(b)(1) of the Act, 21 U.S.C. section 360bbb-3(b)(1), unless the authorization is terminated or revoked sooner. Performed at [facility], [address] [place], [place] [address]   MRSA PCR Screening     Status: None   Collection Time: 08/23/19  2:45 AM   Specimen: Nasopharyngeal  Result Value Ref Range Status   MRSA by PCR NEGATIVE NEGATIVE Final    Comment:        The GeneXpert MRSA Assay (FDA approved for NASAL specimens only), is one component of a comprehensive MRSA colonization surveillance program. It is not intended to diagnose MRSA infection nor to guide or monitor treatment for MRSA infections. Performed at [facility], [address] [place], [place] [address]      Scheduled Meds: . atorvastatin  40 mg Oral Daily  . FLUoxetine  10 mg Oral Daily  . hydrocortisone sod succinate (SOLU-CORTEF) inj  25 mg Intravenous Q12H  . levothyroxine  75 mcg Oral Q0600  . multivitamin with minerals  1 tablet Oral Daily  . pantoprazole  40 mg Oral Daily  . potassium chloride  20 mEq Oral BID  . rivaroxaban  20 mg Oral Q supper   Continuous Infusions: . dextrose 5 % with KCl 20 mEq / L 20 mEq (08/31/19 0807)    Assessment/Plan:   1. Hypernatremia and hypokalemia.   Increased D5W with KCl to bring down sodium and increase potassium.  I encouraged the patient to eat.  Speech therapy following.  Patient will have to increase her food intake in order to survive. 2. Acute metabolic encephalopathy, expressive aphasia.  MRI of the brain negative for acute infarct.  Patient started improving with stress dose steroids.  Continue to taper steroids to 25 mg of Solu-Cortef twice daily.  Hopefully tomorrow we will switch to oral. 3. Acute cystitis.  Treated with antibiotics.  Completed course. 4. Severe sepsis, present on admission.  Enterococcus faecalis on UTI 5. Stage II sacral decubitus present on admission see description below 6. Hypothyroidism unspecified.  TSH low.  On levothyroxine. 7. Hyperlipidemia on statin 8. ADHD.  Adderall held. 9. Weakness.  PT and OT consultations 10. Recurrent DVT.  On Xarelto 11. Depression restart Prozac.  Pressure Injury 08/22/19 Buttocks Right;Left Stage 2 -  Partial thickness loss of dermis presenting as a shallow open injury with a red, pink wound bed without slough. (Active)  08/22/19 2100  Location: Buttocks  Location Orientation: Right;Left  Staging: Stage 2 -  Partial thickness loss of dermis presenting as a shallow open injury with a red, pink wound bed without slough.  Wound Description (Comments):   Present on Admission: Yes       Code Status:     Code Status Orders  (From admission, onward)         Start     Ordered   08/22/19 0159  Full code  Continuous     08/22/19 0200        Code Status History    Date Active Date Inactive Code Status Order ID Comments User Context   08/01/2019 1143 08/09/2019 2221 Full Code [id number]  Lucile Shutters, MD ED   Advance Care Planning Activity  Family Communication: Spoke with husband on the phone Disposition Plan: Status is: Inpatient  Dispo: The patient is from: Rehab              Anticipated d/c is to: Rehab              Anticipated d/c date is: Earliest  potential will likely be 09/03/2019.              Patient currently medically not stable to go out to rehab at this point.  With the patient's sodium still being high and potassium being low she needs to show me that she can eat better in order to survive.  Encouraged eating today.  Followed by speech therapy.  Husband also coming in for meals to try to feed her.  Consultants:  Neurology  Infectious disease  Time spent: 28 minutes  [employer]

## 2019-08-31 NOTE — Evaluation (Signed)
Speech Language Pathology Evaluation Patient Details Name: Morgan Carpenter MRN: [id number] DOB: 10-11-1950 Today's Date: 08/31/2019 Time: 7782-4235 SLP Time Calculation (min) (ACUTE ONLY): 44 min  Problem List:  Patient Active Problem List   Diagnosis Date Noted  . Hypernatremia   . Hypokalemia   . Panhypopituitarism (HCC)   . Acute metabolic encephalopathy   . Acute cystitis without hematuria   . Hyperlipidemia   . Pressure injury of skin 08/23/2019  . Severe sepsis (HCC) 08/22/2019  . Osteoarthritis of knee 08/05/2019  . Transaminitis 08/05/2019  . Weakness 08/05/2019  . Cervical myelopathy (HCC) 08/05/2019  . Complicated UTI (urinary tract infection) 08/01/2019  . Hypothyroidism   . Sarcoidosis   . OSA (obstructive sleep apnea)    Past Medical History:  Past Medical History:  Diagnosis Date  . Adrenal insufficiency (HCC)   . DDD (degenerative disc disease), cervical   . DDD (degenerative disc disease), lumbar   . Hemiplegia (HCC)   . Hypercholesteremia   . Hyperprolactinemia (HCC)   . Hypopituitarism (HCC)   . Lymphocytic hypophysitis (HCC)   . OSA (obstructive sleep apnea)   . Patient is Jehovah's Witness   . Recurrent deep vein thrombosis (DVT) (HCC)   . Sarcoidosis   . Stroke (HCC)   . Thyroid disease   . Vitamin D deficiency    Past Surgical History:  Past Surgical History:  Procedure Laterality Date  . CHOLECYSTECTOMY    . NOSE SURGERY     HPI:  Pt is a 69 y.o. female African-American female with a known history of Hypopituitarism, hemiplegia, DDD, DVT, sarcoidosis, obstructive sleep apnea, hypopituitarism on low dose Prednisone, dyslipidemia and CVA (2009) with residual right hemiparesis, who presented to the emergency room with acute onset of altered mental status with incoherence and confusion.  Pt from [facility] where she is receiving rehab services for mobility. The patient was diagnosed with UTI.  MRI (08/27/2019) was negative for acute infarct with  chronic left MCA infarct.    Assessment / Plan / Recommendation Clinical Impression  Pt's cognitive communication abilities are severely impaired over baseline abilities. Per chart review, pt had L MCA CVA in 2009 that left pt with expressive aphasia as well as apraxia of speech. Pt received Outpatient ST services at [facility] in 2017 with discharge summary describing pt as having "anomic aphasia and apraxia of speech, with written language impairment. Pt's rate of speech is typically produced at a rate that allows her to be intelligible during these sessions. Speech was 100% intelligible during this session. During 3 episodes or word finding difficulty, patient was able to use circumlocution techniques to aid the listener effectively. Together with husband, it was determined that Patient will be discharged form speech therapy."  This writer spoke with pt's husband via phone. Prior to most recent hospitalizations, pt was "holding conversations, putting sentences together, and talking on the phone to family and friends." Documentation from PT during last hospitalization also confirms this baseline ability (May 5th).   Currently pt's husband endorses vast acute changes in pt's communicative abilities. During this hospitalization, he reports that on "good days" pt is able to answer his questions by stating "yes, I would like some pears" when asked. She is no longer able to carry on conversations or communicate wants and needs (further confirmed by recent PT note stating that pt was not able to communicate where she was experiencing pain).   During this evaluation, pt was weeping as she attempted to verbally communicate. Pt presents with  severe cognitive communicative deficits c/b severe expressive aphasia, apraxia of speech and cognitive abilities that are exacerbated over baseline. Pt's expressive abilities are c/b severe anomia, single word utterances with occasional 2 word phrases that are dysfluent d/t phonemic  perseveration and groping. Pt is not able to imitate rote information such as days of week, singing Happy Rudene Anda or Leone Payor. She lacks task initiation and sustained attention to task. Despite maximal cues (binary verbal choices, yes/no questions) pt is unable to answer any questions related to orientation. She indicates that she is in pain but is not able to indicate location or nature of pain even with yes/no questions. At this time, pt's cognitive communication abilities are significantly impaired and prevent her from communicating basic wants/needs/pain. Skilled ST is required to target the above mentioned deficits to increase functional communication abilities. Educaiton provided to pt's husband who expressed appreciation for phone call and information. He voiced agreement with ST POC to target cognitive communication deficits.     SLP Assessment  SLP Recommendation/Assessment: Patient needs continued Speech Lanaguage Pathology Services SLP Visit Diagnosis: Aphasia (R47.01);Apraxia (R48.2);Cognitive communication deficit (R41.841)    Follow Up Recommendations  Skilled Nursing facility    Frequency and Duration min 3x week  2 weeks      SLP Evaluation Cognition  Overall Cognitive Status: Impaired/Different from baseline Arousal/Alertness: Awake/alert Orientation Level: Oriented to person;Disoriented to place;Disoriented to time;Disoriented to situation Attention: Sustained Sustained Attention: Impaired Sustained Attention Impairment: Verbal basic;Functional basic Memory: (unable to assess d/t aphasia) Awareness: Appears intact Executive Function: (all impaired d/t lower level deficits) Comments: poor task initiation       Comprehension  Auditory Comprehension Overall Auditory Comprehension: Impaired Yes/No Questions: Impaired(d/t cognitive communication deficits) Basic Biographical Questions: 0-25% accurate Basic Immediate Environment Questions: 0-24% accurate Commands:  Impaired One Step Basic Commands: 0-24% accurate Visual Recognition/Discrimination Discrimination: Not tested Reading Comprehension Reading Status: Not tested    Expression Expression Primary Mode of Expression: Verbal Verbal Expression Overall Verbal Expression: Impaired Initiation: Impaired Automatic Speech: Name Level of Generative/Spontaneous Verbalization: Word;Phrase(2 word phrase) Repetition: Impaired Level of Impairment: Word level Naming: Impairment Responsive: 0-25% accurate Confrontation: Impaired Convergent: 0-24% accurate Divergent: 0-24% accurate Other Naming Comments: severe anomia and apraxia of speech Verbal Errors: Aware of errors;Perseveration Pragmatics: Impairment Impairments: Abnormal affect;Eye contact Non-Verbal Means of Communication: Not applicable   Oral / Motor  Oral Motor/Sensory Function Overall Oral Motor/Sensory Function: Within functional limits Motor Speech Overall Motor Speech: Impaired Respiration: Within functional limits Phonation: Low vocal intensity Resonance: Within functional limits Articulation: Impaired Level of Impairment: Word Intelligibility: Intelligibility reduced Word: 25-49% accurate Phrase: 0-24% accurate Sentence: Not tested Conversation: Not tested Motor Planning: Impaired Level of Impairment: Word Motor Speech Errors: Aware;Groping for words;Consistent   GO                   Raia Amico B. Rutherford Nail M.S., CCC-SLP, [place] Office [phone]  Stormy Fabian 08/31/2019, 12:27 PM

## 2019-08-31 NOTE — Progress Notes (Signed)
PROGRESS NOTE   Pt consumed thin liquids and soft solids during Speech-Language evaluation with no overt s/s of aspiration or dysphagia. Continue current diet. ST to continue following for dysphagia and cognitive communication deficits.   Ledarius Leeson B. Dreama Saa M.S., CCC-SLP, [facility] Speech-Language Pathologist Rehabilitation Services Office [phone]

## 2019-08-31 NOTE — NC FL2 (Signed)
[place] LEVEL OF CARE SCREENING TOOL     IDENTIFICATION  Patient Name: AMADI YOSHINO Birthdate: [birth year] Sex: female Admission Date (Current Location): 08/21/2019  [place] and [place] Number:  Engineering geologist and Address:  [facility], [address], [place], [place] [address]      Provider Number: [id number]  Attending Physician Name and Address:  Loletha Grayer, MD  Relative Name and Phone Number:  Joyce Gross [phone]    Current Level of Care: Hospital Recommended Level of Care: [place] Prior Approval Number:    Date Approved/Denied:   PASRR Number: [id number]  Discharge Plan: SNF    Current Diagnoses: Patient Active Problem List   Diagnosis Date Noted  . Depression   . Hypernatremia   . Hypokalemia   . Panhypopituitarism ([place])   . Acute metabolic encephalopathy   . Acute cystitis without hematuria   . Hyperlipidemia   . Pressure injury of skin 08/23/2019  . Severe sepsis ([place]) 08/22/2019  . Osteoarthritis of knee 08/05/2019  . Transaminitis 08/05/2019  . Weakness 08/05/2019  . Cervical myelopathy ([place]) 08/05/2019  . Complicated UTI (urinary tract infection) 08/01/2019  . Hypothyroidism   . Sarcoidosis   . OSA (obstructive sleep apnea)     Orientation RESPIRATION BLADDER Height & Weight     Self  Normal External catheter, Incontinent(placed 5/20) Weight: 176 lb 9.6 oz (80.1 kg) Height:  5\' 4"  (162.6 cm)  BEHAVIORAL SYMPTOMS/MOOD NEUROLOGICAL BOWEL NUTRITION STATUS      Incontinent Diet(DYS 2 diet, thin liquids)  AMBULATORY STATUS COMMUNICATION OF NEEDS Skin   Extensive Assist   PU Stage and Appropriate Care   PU Stage 2 Dressing: (located on buttocks, foam dressing, change PRN)                   Personal Care Assistance Level of Assistance  Bathing, Feeding, Dressing Bathing Assistance: Maximum assistance Feeding assistance: Limited assistance Dressing  Assistance: Maximum assistance     Functional Limitations Info  Sight, Hearing, Speech Sight Info: Adequate Hearing Info: Adequate Speech Info: Adequate    SPECIAL CARE FACTORS FREQUENCY  PT (By licensed PT), OT (By licensed OT)     PT Frequency: 5x OT Frequency: 5x            Contractures Contractures Info: Not present    Additional Factors Info  Code Status, Allergies Code Status Info: Full Code Allergies Info: Naprosyn (Naproxen), Shellfish Allergy, Sulfa Antibiotics           Current Medications (08/31/2019):  This is the current hospital active medication list Current Facility-Administered Medications  Medication Dose Route Frequency Provider Last Rate Last Admin  . acetaminophen (TYLENOL) tablet 650 mg  650 mg Oral Q6H PRN Mansy, Jan A, MD   650 mg at 08/30/19 2109   Or  . acetaminophen (TYLENOL) suppository 650 mg  650 mg Rectal Q6H PRN Mansy, Jan A, MD      . atorvastatin (LIPITOR) tablet 40 mg  40 mg Oral Daily Mansy, Jan A, MD   40 mg at 08/30/19 1701  . dextrose 5 % with KCl 20 mEq / L  infusion  20 mEq Intravenous Continuous Loletha Grayer, MD 100 mL/hr at 08/31/19 0807 20 mEq at 08/31/19 0807  . FLUoxetine (PROZAC) capsule 10 mg  10 mg Oral Daily Wieting, Richard, MD      . hydrocortisone sodium succinate (SOLU-CORTEF) 100 MG injection 25 mg  25 mg Intravenous Q12H Loletha Grayer, MD      .  levothyroxine (SYNTHROID) tablet 75 mcg  75 mcg Oral Q0600 Lolita Patella B, MD   75 mcg at 08/31/19 0645  . magnesium hydroxide (MILK OF MAGNESIA) suspension 30 mL  30 mL Oral Daily PRN Mansy, Jan A, MD      . multivitamin with minerals tablet 1 tablet  1 tablet Oral Daily Mansy, Jan A, MD   1 tablet at 08/31/19 0808  . ondansetron (ZOFRAN) injection 4 mg  4 mg Intravenous Q6H PRN Lolita Patella B, MD      . pantoprazole (PROTONIX) EC tablet 40 mg  40 mg Oral Daily Mansy, Jan A, MD   40 mg at 08/31/19 0808  . potassium chloride (KLOR-CON) packet 20 mEq  20 mEq  Oral BID Alford Highland, MD   20 mEq at 08/31/19 0807  . rivaroxaban (XARELTO) tablet 20 mg  20 mg Oral Q supper Alford Highland, MD   20 mg at 08/30/19 1701     Discharge Medications: Please see discharge summary for a list of discharge medications.  Relevant Imaging Results:  Relevant Lab Results:   Additional Information SSN:[id number]  Maree Krabbe, LCSW

## 2019-08-31 NOTE — TOC Initial Note (Signed)
Transition of Care [facility]) - Initial/Assessment Note    Patient Details  Name: Morgan Carpenter MRN: [id number] Date of Birth: [birth year]  Transition of Care [facility]) CM/SW Contact:    Eileen Stanford, LCSW Phone Number: 08/31/2019, 2:55 PM  Clinical Narrative:  Pt only alert to self. CSW spoke with pt's spouse. Pt's spouse confirmed pt was at [facility] in the past however, they do not want her going back there. Pt's spouse would like CSW to try [facility]. CSW will send referral. Pt will need prior auth.                  Expected Discharge Plan: Skilled Nursing Facility Barriers to Discharge: Ship broker, Continued Medical Work up   Patient Goals and CMS Choice Patient states their goals for this hospitalization and ongoing recovery are:: to get pt stronger-- per spouse   Choice offered to / list presented to : Spouse  Expected Discharge Plan and Services Expected Discharge Plan: [place] In-house Referral: Clinical Social Work   Post Acute Care Choice: [place] Living arrangements for the past 2 months: [place]                                      Prior Living Arrangements/Services Living arrangements for the past 2 months: Single Family Home Lives with:: Spouse Patient language and need for interpreter reviewed:: Yes Do you feel safe going back to the place where you live?: Yes      Need for Family Participation in Patient Care: Yes (Comment) Care giver support system in place?: Yes (comment)   Criminal Activity/Legal Involvement Pertinent to Current Situation/Hospitalization: No - Comment as needed  Activities of Daily Living Home Assistive Devices/Equipment: Eyeglasses, Environmental consultant (specify type) ADL Screening (condition at time of admission) Patient's cognitive ability adequate to safely complete daily activities?: No Is the patient deaf or have difficulty hearing?: No Does the patient have difficulty seeing, even  when wearing glasses/contacts?: No Does the patient have difficulty concentrating, remembering, or making decisions?: Yes Patient able to express need for assistance with ADLs?: No Does the patient have difficulty dressing or bathing?: Yes Independently performs ADLs?: No Communication: Independent Does the patient have difficulty walking or climbing stairs?: Yes Weakness of Legs: Both Weakness of Arms/Hands: Both  Permission Sought/Granted Permission sought to share information with : Family Supports    Share Information with NAME: Fritz Pickerel  Permission granted to share info w AGENCY: Dietitian granted to share info w Relationship: Spouse     Emotional Assessment Appearance:: Appears stated age Attitude/Demeanor/Rapport: Unable to Assess Affect (typically observed): Unable to Assess Orientation: : Oriented to Self Alcohol / Substance Use: Not Applicable Psych Involvement: No (comment)  Admission diagnosis:  Severe sepsis ([place]) [A41.9, R65.20] Patient Active Problem List   Diagnosis Date Noted  . Depression   . Hypernatremia   . Hypokalemia   . Panhypopituitarism ([place])   . Acute metabolic encephalopathy   . Acute cystitis without hematuria   . Hyperlipidemia   . Pressure injury of skin 08/23/2019  . Severe sepsis ([place]) 08/22/2019  . Osteoarthritis of knee 08/05/2019  . Transaminitis 08/05/2019  . Weakness 08/05/2019  . Cervical myelopathy ([place]) 08/05/2019  . Complicated UTI (urinary tract infection) 08/01/2019  . Hypothyroidism   . Sarcoidosis   . OSA (obstructive sleep apnea)    PCP:  Su Monks, MD  Pharmacy:   [facility] [phone] Nicholes Rough, [facility] - [address] [place] [address] Phone: [phone] Fax: [phone]     Social Determinants of Health (SDOH) Interventions    Readmission Risk Interventions No flowsheet data found.

## 2019-09-01 LAB — MAGNESIUM: Magnesium: 2.4 mg/dL (ref 1.7–2.4)

## 2019-09-01 LAB — BASIC METABOLIC PANEL
Anion gap: 9 (ref 5–15)
BUN: 20 mg/dL (ref 8–23)
CO2: 23 mmol/L (ref 22–32)
Calcium: 8.2 mg/dL — ABNORMAL LOW (ref 8.9–10.3)
Chloride: 113 mmol/L — ABNORMAL HIGH (ref 98–111)
Creatinine, Ser: 0.67 mg/dL (ref 0.44–1.00)
GFR calc Af Amer: >60 mL/min (ref >60)
GFR calc non Af Amer: >60 mL/min (ref >60)
Glucose, Bld: 140 mg/dL — ABNORMAL HIGH (ref 70–99)
Potassium: 4.3 mmol/L (ref 3.5–5.1)
Sodium: 145 mmol/L (ref 135–145)

## 2019-09-01 LAB — PHOSPHORUS: Phosphorus: 3.1 mg/dL (ref 2.5–4.6)

## 2019-09-01 MED ORDER — HYDROCORTISONE 5 MG PO TABS
15.0000 mg | ORAL_TABLET | Freq: Two times a day (BID) | ORAL | Status: DC
  Filled 2019-09-01: qty 1

## 2019-09-01 MED ORDER — HYDROCORTISONE 5 MG PO TABS
15.0000 mg | ORAL_TABLET | Freq: Two times a day (BID) | ORAL | Status: DC
  Administered 2019-09-01: 15 mg via ORAL
  Filled 2019-09-01 (×2): qty 1

## 2019-09-01 NOTE — Progress Notes (Signed)
Patient ID: Morgan Carpenter, female   DOB: 08/15/1950, 69 y.o.   MRN: [id number] [facility] PROGRESS NOTE  JHORDYN HOOPINGARNER CBS:[id number] DOB: 1950-11-28 DOA: 08/21/2019 PCP: Michail Sermon, MD  HPI/Subjective: As per nursing staff she did very well with breakfast this morning.  As per husband she did eat a little bit for lunch.  Patient was smiling and feeling a little bit better.  Objective: Vitals:   09/01/19 0756 09/01/19 1153  BP: (!) 175/92 (!) 155/88  Pulse: 76 86  Resp: 16 18  Temp: 98.5 F (36.9 C) 97.8 F (36.6 C)  SpO2: 100% 100%    Intake/Output Summary (Last 24 hours) at 09/01/2019 1354 Last data filed at 09/01/2019 0603 Gross per 24 hour  Intake --  Output 800 ml  Net -800 ml   Filed Weights   08/29/19 0245 08/30/19 0506 09/01/19 0541  Weight: 80.5 kg 80.1 kg 81.6 kg    ROS: Review of Systems  Unable to perform ROS: Acuity of condition  Respiratory: Negative for shortness of breath.   Cardiovascular: Negative for chest pain.  Gastrointestinal: Negative for abdominal pain.  Neurological: Negative for headaches.   Exam: Physical Exam  HENT:  Nose: No mucosal edema.  Mouth/Throat: No oropharyngeal exudate or posterior oropharyngeal edema.  Eyes: Pupils are equal, round, and reactive to light. Conjunctivae and lids are normal.  Neck: Carotid bruit is not present.  Cardiovascular: S1 normal, S2 normal and normal heart sounds.  Respiratory: She has decreased breath sounds in the right lower field and the left lower field. She has no wheezes. She has no rhonchi. She has no rales.  GI: Soft. Bowel sounds are normal. There is no abdominal tenderness.  Musculoskeletal:     Right ankle: Swelling present.     Left ankle: Swelling present.  Neurological: She is alert.  Unable to straight leg raise but able to flex and extend at the ankle.  Legs very stiff.  Able to lift both arms up.  Follows some simple commands.   Skin: Skin is warm. Nails show no clubbing.   Psychiatric:  Better with her mood today.      Data Reviewed: Basic Metabolic Panel: Recent Labs  Lab 08/27/19 0716 08/27/19 8466 08/28/19 0526 08/29/19 5993 08/30/19 0543 08/31/19 0627 09/01/19 0711  NA 152*  --   --  153* 154* 147* 145  K 3.6  --   --  2.9* 3.2* 3.6 4.3  CL 123*  --   --  124* 124* 118* 113*  CO2 22  --   --  21* 23 21* 23  GLUCOSE 146*  --   --  121* 129* 133* 140*  BUN 12  --   --  27* 28* 24* 20  CREATININE 0.79   < > 0.92 0.86 0.74 0.74 0.67  CALCIUM 8.2*  --   --  8.4* 8.4* 8.1* 8.2*  MG  --   --   --   --  2.3  --  2.4  PHOS  --   --   --   --  2.4*  --  3.1   < > = values in this interval not displayed.   Liver Function Tests: Recent Labs  Lab 08/26/19 0340  AST 76*  ALT 45*  ALKPHOS 61  BILITOT 0.7  PROT 5.3*  ALBUMIN 2.1*    CBC: Recent Labs  Lab 08/26/19 0340 08/27/19 0716 08/28/19 0526 08/30/19 0543  WBC 10.2 14.4* 19.8* 15.9*  NEUTROABS 5.1  12.2* 17.2*  --   HGB 9.7* 9.7* 9.9* 10.0*  HCT 29.5* 30.6* 30.7* 32.0*  MCV 84.3 86.9 84.8 88.4  PLT 484* 523* 562* 553*     Recent Results (from the past 240 hour(s))  MRSA PCR Screening     Status: None   Collection Time: 08/23/19  2:45 AM   Specimen: Nasopharyngeal  Result Value Ref Range Status   MRSA by PCR NEGATIVE NEGATIVE Final    Comment:        The GeneXpert MRSA Assay (FDA approved for NASAL specimens only), is one component of a comprehensive MRSA colonization surveillance program. It is not intended to diagnose MRSA infection nor to guide or monitor treatment for MRSA infections. Performed at [facility], [address] [place], [place] [address]      Scheduled Meds: . atorvastatin  40 mg Oral Daily  . FLUoxetine  10 mg Oral Daily  . hydrocortisone  15 mg Oral BID  . levothyroxine  75 mcg Oral Q0600  . multivitamin with minerals  1 tablet Oral Daily  . pantoprazole  40 mg Oral Daily  . potassium chloride  20 mEq Oral BID  . rivaroxaban   20 mg Oral Q supper   Continuous Infusions: . dextrose 5 % with KCl 20 mEq / L 20 mEq (09/01/19 0548)    Assessment/Plan:   1. Hypernatremia and hypokalemia.  Both of these labs now in the normal range.  Hoping to stop IV fluids completely tomorrow since she is now eating a little bit better. 2. Acute metabolic encephalopathy, expressive aphasia.  MRI of the brain negative for acute infarct.  Patient started improving with stress dose steroids.  Change to oral hydrocortisone twice daily dosing today. 3. Acute cystitis.  Treated with antibiotics.  Completed course. 4. Severe sepsis, present on admission.  Enterococcus faecalis on UTI 5. Stage II sacral decubitus present on admission see description below 6. Hypothyroidism unspecified.  TSH low.  On levothyroxine. 7. Hyperlipidemia on statin 8. ADHD.  Adderall held. 9. Weakness.  PT and OT consultations.  PT recommended rehab 10. Recurrent DVT.  On Xarelto 11. Depression.  Continue Prozac.  Pressure Injury 08/22/19 Buttocks Right;Left Stage 2 -  Partial thickness loss of dermis presenting as a shallow open injury with a red, pink wound bed without slough. (Active)  08/22/19 2100  Location: Buttocks  Location Orientation: Right;Left  Staging: Stage 2 -  Partial thickness loss of dermis presenting as a shallow open injury with a red, pink wound bed without slough.  Wound Description (Comments):   Present on Admission: Yes       Code Status:     Code Status Orders  (From admission, onward)         Start     Ordered   08/22/19 0159  Full code  Continuous     08/22/19 0200        Code Status History    Date Active Date Inactive Code Status Order ID Comments User Context   08/01/2019 1143 08/09/2019 2221 Full Code [id number]  Collier Bullock, MD ED   Advance Care Planning Activity     Family Communication: Spoke with husband on the phone Disposition Plan: Status is: Inpatient  Dispo: The patient is from: Rehab               Anticipated d/c is to: Rehab              Anticipated d/c date is: Earliest potential will  likely be 09/03/2019.              Patient currently being treated for hyponatremia and hypokalemia.  Hopefully he will be able to get rid of the IV fluids tomorrow and have her eat to maintain her nutritional status.  Consultants:  Neurology  Infectious disease  Time spent: 27 minutes  [employer]

## 2019-09-02 LAB — MAGNESIUM: Magnesium: 2.3 mg/dL (ref 1.7–2.4)

## 2019-09-02 LAB — BASIC METABOLIC PANEL
Anion gap: 5 (ref 5–15)
BUN: 14 mg/dL (ref 8–23)
CO2: 26 mmol/L (ref 22–32)
Calcium: 8.3 mg/dL — ABNORMAL LOW (ref 8.9–10.3)
Chloride: 112 mmol/L — ABNORMAL HIGH (ref 98–111)
Creatinine, Ser: 0.73 mg/dL (ref 0.44–1.00)
GFR calc Af Amer: >60 mL/min (ref >60)
GFR calc non Af Amer: >60 mL/min (ref >60)
Glucose, Bld: 113 mg/dL — ABNORMAL HIGH (ref 70–99)
Potassium: 4.4 mmol/L (ref 3.5–5.1)
Sodium: 143 mmol/L (ref 135–145)

## 2019-09-02 LAB — CBC
HCT: 35 % — ABNORMAL LOW (ref 36.0–46.0)
Hemoglobin: 11.1 g/dL — ABNORMAL LOW (ref 12.0–15.0)
MCH: 28 pg (ref 26.0–34.0)
MCHC: 31.7 g/dL (ref 30.0–36.0)
MCV: 88.4 fL (ref 80.0–100.0)
Platelets: 539 10*3/uL — ABNORMAL HIGH (ref 150–400)
RBC: 3.96 MIL/uL (ref 3.87–5.11)
RDW: 20 % — ABNORMAL HIGH (ref 11.5–15.5)
WBC: 13.8 10*3/uL — ABNORMAL HIGH (ref 4.0–10.5)
nRBC: 0.1 % (ref 0.0–0.2)

## 2019-09-02 MED ORDER — HYDROCORTISONE 10 MG PO TABS
20.0000 mg | ORAL_TABLET | Freq: Every day | ORAL | Status: DC
  Administered 2019-09-02 – 2019-09-10 (×9): 20 mg via ORAL
  Filled 2019-09-02 (×9): qty 2

## 2019-09-02 MED ORDER — HYDROCORTISONE 10 MG PO TABS
10.0000 mg | ORAL_TABLET | Freq: Every day | ORAL | Status: DC
  Administered 2019-09-02 – 2019-09-09 (×8): 10 mg via ORAL
  Filled 2019-09-02 (×9): qty 1

## 2019-09-02 MED ORDER — POTASSIUM CHLORIDE 20 MEQ PO PACK
20.0000 meq | PACK | Freq: Every day | ORAL | Status: DC
  Administered 2019-09-03 – 2019-09-10 (×8): 20 meq via ORAL
  Filled 2019-09-02 (×8): qty 1

## 2019-09-02 NOTE — Care Management Important Message (Signed)
Important Message  Patient Details  Name: Morgan Carpenter MRN: [id number] Date of Birth: 03-01-1951   Medicare Important Message Given:  Yes     [name] 09/02/2019, 10:50 AM
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
## 2019-09-02 NOTE — Progress Notes (Signed)
Patient ID: Morgan Carpenter, female   DOB: 1950-12-14, 69 y.o.   MRN: [id number] [facility] PROGRESS NOTE  Morgan Carpenter KJZ:[id number] DOB: [birth year] DOA: 08/21/2019 PCP: Michail Sermon, MD  HPI/Subjective: More talkative today.  Able to put more words together today.  Told the nurse that she did not like anything on her breakfast tray.  Nursing staff was going to get her some yogurts and other food items that she would like to eat.  Objective: Vitals:   09/02/19 0919 09/02/19 1141  BP: (!) 133/99 130/63  Pulse: 90 87  Resp: 18 19  Temp: 98.7 F (37.1 C) 98.2 F (36.8 C)  SpO2: 98% 100%    Intake/Output Summary (Last 24 hours) at 09/02/2019 1428 Last data filed at 09/02/2019 1300 Gross per 24 hour  Intake 4922.67 ml  Output 1400 ml  Net 3522.67 ml   Filed Weights   08/30/19 0506 09/01/19 0541 09/02/19 0444  Weight: 80.1 kg 81.6 kg 82.2 kg    ROS: Review of Systems  Unable to perform ROS: Acuity of condition  Respiratory: Negative for shortness of breath.   Cardiovascular: Negative for chest pain.  Gastrointestinal: Negative for abdominal pain.  Neurological: Negative for headaches.   Exam: Physical Exam  HENT:  Nose: No mucosal edema.  Mouth/Throat: No oropharyngeal exudate or posterior oropharyngeal edema.  Eyes: Pupils are equal, round, and reactive to light. Conjunctivae and lids are normal.  Neck: Carotid bruit is not present.  Cardiovascular: S1 normal, S2 normal and normal heart sounds.  Respiratory: She has decreased breath sounds in the right lower field and the left lower field. She has no wheezes. She has no rhonchi. She has no rales.  GI: Soft. Bowel sounds are normal. There is no abdominal tenderness.  Musculoskeletal:     Right ankle: Swelling present.     Left ankle: Swelling present.  Neurological: She is alert.  Unable to straight leg raise but able to flex and extend at the ankle.  Legs very stiff.  Able to lift both arms up.  Follows some  simple commands.   Skin: Skin is warm. Nails show no clubbing.  Psychiatric: She has a normal mood and affect.      Data Reviewed: Basic Metabolic Panel: Recent Labs  Lab 08/29/19 0838 08/30/19 0543 08/31/19 0627 09/01/19 0711 09/02/19 0407  NA 153* 154* 147* 145 143  K 2.9* 3.2* 3.6 4.3 4.4  CL 124* 124* 118* 113* 112*  CO2 21* 23 21* 23 26  GLUCOSE 121* 129* 133* 140* 113*  BUN 27* 28* 24* 20 14  CREATININE 0.86 0.74 0.74 0.67 0.73  CALCIUM 8.4* 8.4* 8.1* 8.2* 8.3*  MG  --  2.3  --  2.4 2.3  PHOS  --  2.4*  --  3.1  --     CBC: Recent Labs  Lab 08/27/19 0716 08/28/19 0526 08/30/19 0543 09/02/19 0407  WBC 14.4* 19.8* 15.9* 13.8*  NEUTROABS 12.2* 17.2*  --   --   HGB 9.7* 9.9* 10.0* 11.1*  HCT 30.6* 30.7* 32.0* 35.0*  MCV 86.9 84.8 88.4 88.4  PLT 523* 562* 553* 539*      Scheduled Meds: . atorvastatin  40 mg Oral Daily  . FLUoxetine  10 mg Oral Daily  . hydrocortisone  10 mg Oral QHS  . hydrocortisone  20 mg Oral Daily  . levothyroxine  75 mcg Oral Q0600  . multivitamin with minerals  1 tablet Oral Daily  . pantoprazole  40 mg  Oral Daily  . [START ON 09/03/2019] potassium chloride  20 mEq Oral Daily  . rivaroxaban  20 mg Oral Q supper   Continuous Infusions:   Assessment/Plan:   1. Hypernatremia and hypokalemia.  Both of these labs now in the normal range.  Stop IV fluids today recheck BMP tomorrow.  Patient must eat and drink. 2. Acute metabolic encephalopathy, expressive aphasia.  MRI of the brain negative for acute infarct.  Patient started improving with stress dose steroids.  Continue oral hydrocortisone twice daily dosing. 3. Acute cystitis.  Treated with antibiotics.  Completed course. 4. Severe sepsis, present on admission.  Enterococcus faecalis on UTI. 5. Stage II sacral decubitus present on admission see description below 6. Hypothyroidism unspecified.  TSH low.  On levothyroxine. 7. Hyperlipidemia on statin 8. ADHD.  Adderall  held. 9. Weakness.  PT and OT consultations.  PT recommended rehab 10. Recurrent DVT.  On Xarelto 11. Depression.  Continue Prozac.  Pressure Injury 08/22/19 Buttocks Right;Left Stage 2 -  Partial thickness loss of dermis presenting as a shallow open injury with a red, pink wound bed without slough. (Active)  08/22/19 2100  Location: Buttocks  Location Orientation: Right;Left  Staging: Stage 2 -  Partial thickness loss of dermis presenting as a shallow open injury with a red, pink wound bed without slough.  Wound Description (Comments):   Present on Admission: Yes       Code Status:     Code Status Orders  (From admission, onward)         Start     Ordered   08/22/19 0159  Full code  Continuous     08/22/19 0200        Code Status History    Date Active Date Inactive Code Status Order ID Comments User Context   08/01/2019 1143 08/09/2019 2221 Full Code [id number]  Collier Bullock, MD ED   Advance Care Planning Activity     Family Communication: Spoke with husband on the phone Disposition Plan: Status is: Inpatient  Dispo: The patient is from: Rehab              Anticipated d/c is to: Rehab              Anticipated d/c date is: Earliest potential would be 09/03/2019 depending if we get a rehab bed and authorization and if she is eating.              Patient currently being treated for hyponatremia and hypokalemia.  I stopped IV fluids today.  Recheck BMP tomorrow.  Encouraged eating and drinking.  Panhypopituitarism treated with stress dose steroids and now on oral steroids.  Consultants:  Neurology  Infectious disease  Time spent: 26 minutes  [place]

## 2019-09-02 NOTE — Progress Notes (Addendum)
Occupational Therapy Treatment Patient Details Name: Morgan Carpenter MRN: [id number] DOB: 1951-01-20 Today's Date: 09/02/2019    History of present illness Morgan Carpenter is a 69 year old female admitted for sepsis and UTI, pt came from [facility]. Pt's PMH includes neurosarcoidosis, adrenal insufficiency, degnerative disc disease, OA, DVT, hypopituitarism, OSA, CVA with R side weakness, and lumbar spinal stenosis. Pt presented d/t altered mental status and fever. Treated for UTI, but encephalopathy and fever persisted. Pt started on stress dose steroids on 5/24 with resolution. Pt with expressive aphasia at baseline, but husband reported that it was worsened on admission. Most recent infectious disease MD note reports that pt's expresive aphasia may be returning to baseline.   OT comments  Pt seen for OT tx this date to f/u re: safety with self care ADLs/ADL mobility. OT facilitates pt participation in LB dressing at bed level in which pt makes concerted effort to participate in donning her sock. Requires MAX A after OT threads and is only able to participate in donning L sock. OT facilitates pt participation in sup to/from sit transition with pt requiring MAX A and HOB elevated. Pt with P static sitting balance this date, requiring MIN/MOD A and use of UEs to sustain static balance. Pt with some grimacing as if experiencing pain with mobilization, but pt with difficulty describing pain. Pt tolerates EOB sitting x4-5 mins while OT attempts to engage her in hand to mouth with a cup of ginger ale. Pt requires MOD A initially, but gradually demos better grip on cup, progressing to MIN A for hand to mouth (but ultimately still requiring MOD A all together d/t poor sitting balance). OT assists pt back to bed and returns bed alarm and call button. Anticipate SNF is still most appropriate d/c recommendation.   Follow Up Recommendations  SNF;Supervision/Assistance - 24 hour    Equipment Recommendations  Hospital  bed    Recommendations for Other Services      Precautions / Restrictions Precautions Precautions: Fall Restrictions Weight Bearing Restrictions: No       Mobility Bed Mobility Overal bed mobility: Needs Assistance Bed Mobility: Supine to Sit;Sit to Supine     Supine to sit: Max assist;HOB elevated Sit to supine: Max assist   General bed mobility comments: Pt makes effort to use L hand to reach for bed rail to participate in sup to sit transition. HOB elevated significantly to assist with sup to sit and flattened on sit to sup. OT facilitates MOD verbal/tactile cues for hand placement/advancement of LEs toward EOB. Pt's LEs noted to feel resistive in the hips making assisting with mobilization toward EOB difficult.  Transfers                      Balance Overall balance assessment: Needs assistance Sitting-balance support: Bilateral upper extremity supported Sitting balance-Leahy Scale: Poor Sitting balance - Comments: Pt with poor balance in EOB sitting with R lateral lean towards bed rail (sitting on R side of bed) also grimmaces as if in pain although she has difficulty describing location/quality of pain. Postural control: Right lateral lean     Standing balance comment: unsafe to attempt this session                           ADL either performed or assessed with clinical judgement   ADL Overall ADL's : Needs assistance/impaired Eating/Feeding: Moderate assistance;Bed level Eating/Feeding Details (indicate cue type and reason): HOB  elevatd, hand to mouth with cup, anticipate greater difficulty with smaller utensils                 Lower Body Dressing: Maximal assistance;Bed level Lower Body Dressing Details (indicate cue type and reason): HOB elevated. OT attempts to engage pt in donning socks. Pt makes concerted effort on less weak left side and is able to somewhat pull up on tall sock once OT threads over toes and heels.                      Vision Patient Visual Report: No change from baseline     Perception     Praxis      Cognition Arousal/Alertness: Awake/alert Behavior During Therapy: WFL for tasks assessed/performed Overall Cognitive Status: Impaired/Different from baseline Area of Impairment: Attention;Following commands                   Current Attention Level: Sustained   Following Commands: Follows one step commands with increased time;Follows multi-step commands inconsistently       General Comments: somewhat improved command following. Some clear sentences/communication. Such as "I ate fish for lunch". Pt still with significant difficulty with word finding requiring this author to spend extended time to help meet pt needs/wants and assess her response physically to OT treatment.        Exercises Other Exercises Other Exercises: OT facilitate education with pt re: in-bed UE ROM/body weight resisted exercise that pt can do on her time outside of therapy to improve strength and tolerance. Pt with MIN/MOD reception of education detected. Requests printout that her husband can help her follow.   Shoulder Instructions       General Comments      Pertinent Vitals/ Pain       Pain Assessment: Faces Faces Pain Scale: Hurts even more Pain Location: when mobilizing, unable to specify location Pain Descriptors / Indicators: Aching;Guarding;Grimacing Pain Intervention(s): Limited activity within patient's tolerance;Monitored during session  Home Living                                          Prior Functioning/Environment              Frequency  Min 2X/week        Progress Toward Goals  OT Goals(current goals can now be found in the care plan section)  Progress towards OT goals: Progressing toward goals  Acute Rehab OT Goals Patient Stated Goal: no goals stated OT Goal Formulation: With patient/family Time For Goal Achievement: 09/11/19 Potential to  Achieve Goals: [place] Discharge plan remains appropriate    Co-evaluation                 AM-PAC OT "6 Clicks" Daily Activity     Outcome Measure   Help from another person eating meals?: A Lot Help from another person taking care of personal grooming?: Total Help from another person toileting, which includes using toliet, bedpan, or urinal?: Total Help from another person bathing (including washing, rinsing, drying)?: Total Help from another person to put on and taking off regular upper body clothing?: A Lot Help from another person to put on and taking off regular lower body clothing?: A Lot 6 Click Score: 9    End of Session    OT Visit Diagnosis: Unsteadiness on feet (R26.81);Muscle weakness (generalized) (M62.81)  Activity Tolerance Patient tolerated treatment well;Patient limited by pain   Patient Left in bed;with call bell/phone within reach;with bed alarm set   Nurse Communication          Time: 4859-2763 OT Time Calculation (min): 38 min  Charges: OT General Charges $OT Visit: 1 Visit OT Treatments $Self Care/Home Management : 8-22 mins $Therapeutic Activity: 23-37 mins   Rejeana Brock, MS, OTR/L ascom [phone] 09/02/19, 4:50 PM

## 2019-09-02 NOTE — Plan of Care (Signed)
  Problem: Urinary Elimination: Goal: Signs and symptoms of infection will decrease Outcome: Progressing   Problem: Education: Goal: Knowledge of General Education information will improve Description: Including pain rating scale, medication(s)/side effects and non-pharmacologic comfort measures Outcome: Progressing   Problem: Health Behavior/Discharge Planning: Goal: Ability to manage health-related needs will improve Outcome: Progressing   Problem: Clinical Measurements: Goal: Ability to maintain clinical measurements within normal limits will improve Outcome: Progressing Goal: Will remain free from infection Outcome: Progressing Goal: Diagnostic test results will improve Outcome: Progressing Goal: Respiratory complications will improve Outcome: Progressing Goal: Cardiovascular complication will be avoided Outcome: Progressing   Problem: Activity: Goal: Risk for activity intolerance will decrease Outcome: Progressing   Problem: Nutrition: Goal: Adequate nutrition will be maintained Outcome: Progressing   Problem: Coping: Goal: Level of anxiety will decrease Outcome: Progressing   Problem: Elimination: Goal: Will not experience complications related to bowel motility Outcome: Progressing Goal: Will not experience complications related to urinary retention Outcome: Progressing   Problem: Pain Managment: Goal: General experience of comfort will improve Outcome: Progressing   Problem: Safety: Goal: Ability to remain free from injury will improve Outcome: Progressing   Problem: Skin Integrity: Goal: Risk for impaired skin integrity will decrease Outcome: Progressing   Problem: Fluid Volume: Goal: Hemodynamic stability will improve Outcome: Progressing   Problem: Clinical Measurements: Goal: Diagnostic test results will improve Outcome: Progressing Goal: Signs and symptoms of infection will decrease Outcome: Progressing   Problem: Respiratory: Goal:  Ability to maintain adequate ventilation will improve Outcome: Progressing

## 2019-09-03 LAB — CBC
HCT: 35.6 % — ABNORMAL LOW (ref 36.0–46.0)
Hemoglobin: 11.4 g/dL — ABNORMAL LOW (ref 12.0–15.0)
MCH: 28.1 pg (ref 26.0–34.0)
MCHC: 32 g/dL (ref 30.0–36.0)
MCV: 87.9 fL (ref 80.0–100.0)
Platelets: 543 10*3/uL — ABNORMAL HIGH (ref 150–400)
RBC: 4.05 MIL/uL (ref 3.87–5.11)
RDW: 20.8 % — ABNORMAL HIGH (ref 11.5–15.5)
WBC: 12.8 10*3/uL — ABNORMAL HIGH (ref 4.0–10.5)
nRBC: 0 % (ref 0.0–0.2)

## 2019-09-03 LAB — BASIC METABOLIC PANEL
Anion gap: 8 (ref 5–15)
BUN: 13 mg/dL (ref 8–23)
CO2: 27 mmol/L (ref 22–32)
Calcium: 8.1 mg/dL — ABNORMAL LOW (ref 8.9–10.3)
Chloride: 111 mmol/L (ref 98–111)
Creatinine, Ser: 0.7 mg/dL (ref 0.44–1.00)
GFR calc Af Amer: >60 mL/min (ref >60)
GFR calc non Af Amer: >60 mL/min (ref >60)
Glucose, Bld: 90 mg/dL (ref 70–99)
Potassium: 4 mmol/L (ref 3.5–5.1)
Sodium: 146 mmol/L — ABNORMAL HIGH (ref 135–145)

## 2019-09-03 NOTE — Plan of Care (Signed)
  Problem: Urinary Elimination: Goal: Signs and symptoms of infection will decrease Outcome: Not Progressing   Problem: Education: Goal: Knowledge of General Education information will improve Description: Including pain rating scale, medication(s)/side effects and non-pharmacologic comfort measures Outcome: Not Progressing   Problem: Health Behavior/Discharge Planning: Goal: Ability to manage health-related needs will improve Outcome: Not Progressing

## 2019-09-03 NOTE — Progress Notes (Signed)
  Speech Language Pathology Treatment: Cognitive-Linquistic  Patient Details Name: Morgan Carpenter MRN: [id number] DOB: Jul 28, 1950 Today's Date: 09/03/2019 Time: 1200-1230 SLP Time Calculation (min) (ACUTE ONLY): 30 min  Assessment / Plan / Recommendation Clinical Impression  Skilled ST services included targeted/functional ST tasks, use of verbal/visual/tactile cues, and developed visual aid for enhanced caregiver-communication. Patient presented reclined in bed, awoke upon SLP arrival. Patient with overall improvement in expressive/receptive language with noted increased automatic speech, processing speed/response time, and MLU (~3-5 words) since ST tx session on 5/27. She was oriented x3 given MC cues for the year and able to comprehend humor in x2 opps at sentence level. During first targeted task, patient able to respond appropriately to yes/no questions with >90% accuracy given cues for repetition and simplification of question/sentence. During secondary targeted task, patient able to ID appropriate concrete items on bedside table following wh-hypothetical questions in 3/5 trials (I.e. If you wanted to turn on the TV, which item would you need?, etc). Patient increased accuracy in remaining 2/5 trials given MC and visual cues. Patient demonstrated moderate-severe difficulty with confrontational naming, however benefited from semantic cues, phonemic cues, and sentence completion to improve naming in 3/3 trials.   SLP developed visual aid re: caregiver education to enhance effective communication with patient. SLP recommends the following: use yes/no questions when possible, provide additional time for responses, provide choices when needed, & use shorter sentences + gestures to enhance understanding. SLP reviewed sign with patient - She nodded her head in understanding and agreement with recommendations. Husband not present to provide education, however visual aid left in room for review. Continue  skilled ST services to address expressive/receptive language deficits. Will upgrade diet as tolerated. Patient left with call bell in reach and all needs met prior to exiting room.   HPI  Pt is a 69 y.o. female African-American female with a known history of Hypopituitarism, hemiplegia, DDD, DVT, sarcoidosis, obstructive sleep apnea, hypopituitarism on low dose Prednisone, dyslipidemia and CVA with residual right hemiparesis, who presented to the emergency room with acute onset of altered mental status with incoherence and confusion.  The patient was diagnosed with UTI.  Patient febrile with elevated white blood cell count, procalcitonin and evidence of renal insufficiency above baseline. She resides at [facility]. She was admitted w/ "more confused and altered than her baseline" per chart note      SLP Plan  Continue with current plan of care       Recommendations  Diet recommendations: Dysphagia 2 (fine chop);Thin liquid Liquids provided via: Straw Medication Administration: Other (Comment)(Per MD/RN discretion/preference) Supervision: Full supervision/cueing for compensatory strategies Compensations: Minimize environmental distractions;Slow rate;Small sips/bites Postural Changes and/or Swallow Maneuvers: Seated upright 90 degrees                Oral Care Recommendations: Oral care before and after PO Follow up Recommendations: Skilled Nursing facility SLP Visit Diagnosis: Dysphagia, oral phase (R13.11);Aphasia (R47.01) Plan: Continue with current plan of care       Morgan Carpenter, M.S. CCC-SLP Speech-Language Pathologist   Morgan Carpenter 09/03/2019, 12:37 PM
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
## 2019-09-04 DIAGNOSIS — E870 Hyperosmolality and hypernatremia: Secondary | ICD-10-CM

## 2019-09-04 DIAGNOSIS — E876 Hypokalemia: Secondary | ICD-10-CM

## 2019-09-04 DIAGNOSIS — I824Y9 Acute embolism and thrombosis of unspecified deep veins of unspecified proximal lower extremity: Secondary | ICD-10-CM

## 2019-09-04 LAB — BASIC METABOLIC PANEL
Anion gap: 8 (ref 5–15)
BUN: 14 mg/dL (ref 8–23)
CO2: 28 mmol/L (ref 22–32)
Calcium: 7.9 mg/dL — ABNORMAL LOW (ref 8.9–10.3)
Chloride: 106 mmol/L (ref 98–111)
Creatinine, Ser: 0.86 mg/dL (ref 0.44–1.00)
GFR calc Af Amer: >60 mL/min (ref >60)
GFR calc non Af Amer: >60 mL/min (ref >60)
Glucose, Bld: 103 mg/dL — ABNORMAL HIGH (ref 70–99)
Potassium: 4.5 mmol/L (ref 3.5–5.1)
Sodium: 142 mmol/L (ref 135–145)

## 2019-09-04 LAB — SARS CORONAVIRUS 2 BY RT PCR (HOSPITAL ORDER, PERFORMED IN ~~LOC~~ HOSPITAL LAB): SARS Coronavirus 2: NEGATIVE

## 2019-09-04 MED ORDER — HYDROCORTISONE 20 MG PO TABS: 20.0000 mg | ORAL_TABLET | Freq: Every day | ORAL | 0 refills | Status: AC

## 2019-09-04 MED ORDER — HYDROCORTISONE 10 MG PO TABS: 10.0000 mg | ORAL_TABLET | Freq: Every day | ORAL | 0 refills | Status: AC

## 2019-09-04 NOTE — Progress Notes (Signed)
Physical Therapy Treatment Patient Details Name: Morgan Carpenter MRN: [id number] DOB: December 31, 1950 Today's Date: 09/04/2019    History of Present Illness Morgan Carpenter is a 69 year old female admitted for sepsis and UTI, pt came from [facility]. Pt's PMH includes neurosarcoidosis, adrenal insufficiency, degnerative disc disease, OA, DVT, hypopituitarism, OSA, CVA with R side weakness, and lumbar spinal stenosis. Pt presented d/t altered mental status and fever. Treated for UTI, but encephalopathy and fever persisted. Pt started on stress dose steroids on 5/24 with resolution. Pt with expressive aphasia at baseline, but husband reported that it was worsened on admission. Most recent infectious disease MD note reports that pt's expresive aphasia may be returning to baseline.    PT Comments    Pt is making gradual progress towards goals. Noted with approx ~90 degrees R knee contracture, makes it difficult for lying supine in bed. Appears to prefer sidelying. Performed stretching of R LE. Good endurance with HEP. Will continue to progress as able.   Follow Up Recommendations  SNF     Equipment Recommendations  None recommended by PT    Recommendations for Other Services       Precautions / Restrictions Precautions Precautions: Fall Restrictions Weight Bearing Restrictions: No    Mobility  Bed Mobility Overal bed mobility: Needs Assistance Bed Mobility: Supine to Sit;Sit to Supine     Supine to sit: Max assist     General bed mobility comments: safe technique and follows commands. Cues and heavy assist for trunk support when coming up towards EOB. Unable to maintain seated balance without assist  Transfers                 General transfer comment: unable to stand'  Ambulation/Gait                 Stairs             Wheelchair Mobility    Modified Rankin (Stroke Patients Only)       Balance Overall balance assessment: Needs assistance Sitting-balance  support: Bilateral upper extremity supported Sitting balance-Leahy Scale: Poor Sitting balance - Comments: R side post leaning                                    Cognition Arousal/Alertness: Awake/alert Behavior During Therapy: WFL for tasks assessed/performed Overall Cognitive Status: Impaired/Different from baseline                                 General Comments: able to follow short commands, however in general is confused      Exercises Other Exercises Other Exercises: Supine ther-ex performed on B LE including SLR and hip abd/add x 10 reps with max assist    General Comments        Pertinent Vitals/Pain Pain Assessment: No/denies pain    Home Living                      Prior Function            PT Goals (current goals can now be found in the care plan section) Acute Rehab PT Goals Patient Stated Goal: no goals stated PT Goal Formulation: Patient unable to participate in goal setting Time For Goal Achievement: 09/09/19 Potential to Achieve Goals: Poor Progress towards PT goals: Progressing toward goals  Frequency    Min 2X/week      PT Plan Current plan remains appropriate    Co-evaluation              AM-PAC PT "6 Clicks" Mobility   Outcome Measure  Help needed turning from your back to your side while in a flat bed without using bedrails?: Total Help needed moving from lying on your back to sitting on the side of a flat bed without using bedrails?: Total Help needed moving to and from a bed to a chair (including a wheelchair)?: Total Help needed standing up from a chair using your arms (e.g., wheelchair or bedside chair)?: Total Help needed to walk in hospital room?: Total Help needed climbing 3-5 steps with a railing? : Total 6 Click Score: 6    End of Session Equipment Utilized During Treatment: Gait belt Activity Tolerance: Patient tolerated treatment well Patient left: in bed;with bed alarm  set Nurse Communication: Mobility status PT Visit Diagnosis: Muscle weakness (generalized) (M62.81) Pain - Right/Left: Left Pain - part of body: Knee     Time: 0943-1000 PT Time Calculation (min) (ACUTE ONLY): 17 min  Charges:  $Therapeutic Exercise: 8-22 mins                     Greggory Stallion, PT, DPT [phone]    Morgan Carpenter 09/04/2019, 2:53 PM

## 2019-09-04 NOTE — TOC Progression Note (Signed)
Transition of Care [facility]) - Progression Note    Patient Details  Name: Morgan Carpenter MRN: [id number] Date of Birth: 1950-04-09  Transition of Care [facility]) CM/SW Contact  Shawn Route, RN Phone Number: 09/04/2019, 4:06 PM  Clinical Narrative:     Discharge plan is transfer to [facility] once insurance auth received.  Helmut Muster at [place] said she needed by 4pm today, this did not happen.  I anticipate tomorrow discharge.  Expected Discharge Plan: Skilled Nursing Facility Barriers to Discharge: English as a second language teacher, Continued Medical Work up  Expected Discharge Plan and Services Expected Discharge Plan: Skilled Nursing Facility In-house Referral: Clinical Social Work   Post Acute Care Choice: Skilled Nursing Facility Living arrangements for the past 2 months: Single Family Home Expected Discharge Date: 09/04/19                                     Social Determinants of Health (SDOH) Interventions    Readmission Risk Interventions No flowsheet data found.

## 2019-09-04 NOTE — Discharge Summary (Signed)
[facility] [place] at [facility]   PATIENT NAME: Morgan Carpenter    MR#:  [id number]  DATE OF BIRTH:  Apr 23, 1950  DATE OF ADMISSION:  08/21/2019 ADMITTING PHYSICIAN: Hannah Beat, MD  DATE OF DISCHARGE: 09/04/2019  PRIMARY CARE PHYSICIAN: Michail Sermon, MD    ADMISSION DIAGNOSIS:  Severe sepsis (HCC) [A41.9, R65.20]  DISCHARGE DIAGNOSIS:  Sepsis on admission--resolved Hypernatremia/hypokalemia--resolved acute metabolic encephalopathy suspected due to panhypopituitarism Recurrent Cystitis  SECONDARY DIAGNOSIS:   Past Medical History:  Diagnosis Date  . Adrenal insufficiency (HCC)   . DDD (degenerative disc disease), cervical   . DDD (degenerative disc disease), lumbar   . Hemiplegia (HCC)   . Hypercholesteremia   . Hyperprolactinemia (HCC)   . Hypopituitarism (HCC)   . Lymphocytic hypophysitis (HCC)   . OSA (obstructive sleep apnea)   . Patient is Jehovah's Witness   . Recurrent deep vein thrombosis (DVT) (HCC)   . Sarcoidosis   . Stroke (HCC)   . Thyroid disease   . Vitamin D deficiency     HOSPITAL COURSE:   Morgan Carpenter to the emergency room with acute onset of altered mental status with incoherence and confusion  Hypernatremia and hypokalemia. -electrolytes much improved. Patient drinking water this morning. Good urine output. -Encourage oral fluid intake  -avoid nephrotoxins  2.Acute metabolic encephalopathy, expressive aphasia.  -MRI of the brain negative for acute infarct.  -Patient being treated for panhypopituitarism.  -Patientstarted her improvementwith stress dose steroids--now converted over to oral medications at this point. Continue oral hydrocortisone twice daily dosing.  3. Acute cystitis-recurrentTreated with antibiotics. Completed course.  4.Severe sepsis, present on  admission. Enterococcus faecalis on UTI. -now resolved  5. Chronic Stage II sacral decubitus present on admission see description as above Cont care per RN  6. Hypothyroidism unspecified. TSH low. On levothyroxine.  7. Hyperlipidemia on statin  8.ADHD. Adderall was held--will resume it  9.Weakness. PT and OT consultations. PT recommended rehab  10.Recurrent DVT. On Xarelto  11. Depression. Continue Prozac   CODE STATUS: Full code  Overall slowly improving D/c to rehab today Repeat COVID negative  CONSULTS OBTAINED:  Treatment Team:  Pauletta Browns, MD Lynn Ito, MD  DRUG ALLERGIES:   Allergies  Allergen Reactions  . Naprosyn [Naproxen] Hives  . Shellfish Allergy Hives  . Sulfa Antibiotics Hives    DISCHARGE MEDICATIONS:   Allergies as of 09/04/2019      Reactions   Naprosyn [naproxen] Hives   Shellfish Allergy Hives   Sulfa Antibiotics Hives      Medication List    STOP taking these medications   linezolid 600 MG tablet Commonly known as: ZYVOX   traMADol 50 MG tablet Commonly known as: ULTRAM     TAKE these medications   acetaminophen 500 MG tablet Commonly known as: TYLENOL Take 2 tablets (1,000 mg total) by mouth every 8 (eight) hours.   atorvastatin 40 MG tablet Commonly known as: LIPITOR Take 40 mg by mouth daily.   FLUoxetine 10 MG capsule Commonly known as: PROZAC Take 10 mg by mouth daily.   gabapentin 300 MG capsule Commonly known as: NEURONTIN PLEASE SEE ATTACHED FOR DETAILED DIRECTIONS   hydrocortisone 10 MG tablet Commonly known as: CORTEF Take 1 tablet (10 mg total) by mouth at bedtime.   hydrocortisone 20 MG tablet Commonly known as: CORTEF Take 1 tablet (20 mg total) by mouth daily. Start taking  on: September 05, 2019   levothyroxine 75 MCG tablet Commonly known as: SYNTHROID PLEASE SEE ATTACHED FOR DETAILED DIRECTIONS   multivitamin capsule Take by mouth.   pantoprazole 40 MG  tablet Commonly known as: PROTONIX Take 1 tablet (40 mg total) by mouth daily.   rivaroxaban 20 MG Tabs tablet Commonly known as: XARELTO Take 1 tablet (20 mg total) by mouth daily.   tiZANidine 2 MG tablet Commonly known as: ZANAFLEX Take 2 mg by mouth at bedtime as needed.       If you experience worsening of your admission symptoms, develop shortness of breath, life threatening emergency, suicidal or homicidal thoughts you must seek medical attention immediately by calling 911 or calling your MD immediately  if symptoms less severe.  You Must read complete instructions/literature along with all the possible adverse reactions/side effects for all the Medicines you take and that have been prescribed to you. Take any new Medicines after you have completely understood and accept all the possible adverse reactions/side effects.   Please note  You were cared for by a hospitalist during your hospital stay. If you have any questions about your discharge medications or the care you received while you were in the hospital after you are discharged, you can call the unit and asked to speak with the hospitalist on call if the hospitalist that took care of you is not available. Once you are discharged, your primary care physician will handle any further medical issues. Please note that NO REFILLS for any discharge medications will be authorized once you are discharged, as it is imperative that you return to your primary care physician (or establish a relationship with a primary care physician if you do not have one) for your aftercare needs so that they can reassess your need for medications and monitor your lab values.  DATA REVIEW:   CBC  Recent Labs  Lab 09/03/19 1827  WBC 12.8*  HGB 11.4*  HCT 35.6*  PLT 543*    Chemistries  Recent Labs  Lab 09/02/19 0407 09/03/19 0511 09/04/19 0443  NA 143   < > 142  K 4.4   < > 4.5  CL 112*   < > 106  CO2 26   < > 28  GLUCOSE 113*   < > 103*   BUN 14   < > 14  CREATININE 0.73   < > 0.86  CALCIUM 8.3*   < > 7.9*  MG 2.3  --   --    < > = values in this interval not displayed.    Microbiology Results   Recent Results (from the past 240 hour(s))  SARS Coronavirus 2 by RT PCR (hospital order, performed in [facility] hospital lab) Nasopharyngeal Nasopharyngeal Swab     Status: None   Collection Time: 09/04/19 12:46 PM   Specimen: Nasopharyngeal Swab  Result Value Ref Range Status   SARS Coronavirus 2 NEGATIVE NEGATIVE Final    Comment: (NOTE) SARS-CoV-2 target nucleic acids are NOT DETECTED. The SARS-CoV-2 RNA is generally detectable in upper and lower respiratory specimens during the acute phase of infection. The lowest concentration of SARS-CoV-2 viral copies this assay can detect is 250 copies / mL. A negative result does not preclude SARS-CoV-2 infection and should not be used as the sole basis for treatment or other patient management decisions.  A negative result may occur with improper specimen collection / handling, submission of specimen other than nasopharyngeal swab, presence of viral mutation(s) within the areas targeted  by this assay, and inadequate number of viral copies (<250 copies / mL). A negative result must be combined with clinical observations, patient history, and epidemiological information. Fact Sheet for Patients:   StrictlyIdeas.no Fact Sheet for Healthcare Providers: BankingDealers.co.za This test is not yet approved or cleared  by the [place] FDA and has been authorized for detection and/or diagnosis of SARS-CoV-2 by FDA under an Emergency Use Authorization (EUA).  This EUA will remain in effect (meaning this test can be used) for the duration of the COVID-19 declaration under Section 564(b)(1) of the Act, 21 U.S.C. section 360bbb-3(b)(1), unless the authorization is terminated or revoked sooner. Performed at [facility], [address], [place], [place] [address]     RADIOLOGY:  No results found.   CODE STATUS:     Code Status Orders  (From admission, onward)         Start     Ordered   08/22/19 0159  Full code  Continuous     08/22/19 0200        Code Status History    Date Active Date Inactive Code Status Order ID Comments User Context   08/01/2019 1143 08/09/2019 2221 Full Code [id number]  Collier Bullock, MD ED   Advance Care Planning Activity       TOTAL TIME TAKING CARE OF THIS PATIENT: *40* minutes.    Fritzi Mandes M.D  [facility]    CC: Primary care physician; Su Monks, MD
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
## 2019-09-04 NOTE — Progress Notes (Addendum)
Morgan Carpenter at [place] NAME: Paullette Mckain    MR#:  [id number]  DATE OF BIRTH:  04-02-1951  SUBJECTIVE:  patient awake alert answered most of the basic questions appropriately. Asking for water. She held the cup with her left and drink more than half a cup of water. Tells me her name her husband's name and son's name. She could not get the word out hospital but repeated after me. No new issues per staff.  REVIEW OF SYSTEMS:   Review of Systems  Unable to perform ROS: Mental status change   Tolerating Diet:yes Tolerating PT: yes--recommends rehab  DRUG ALLERGIES:   Allergies  Allergen Reactions  . Naprosyn [Naproxen] Hives  . Shellfish Allergy Hives  . Sulfa Antibiotics Hives    VITALS:  Blood pressure (!) 143/100, pulse 86, temperature 98.6 F (37 C), temperature source Oral, resp. rate 19, height 5\' 4"  (1.626 m), weight 84.9 kg, SpO2 100 %.  PHYSICAL EXAMINATION:   Physical Exam  GENERAL:  69 y.o.-year-old patient lying in the bed with no acute distress. Obese Chronically ill.   HEENT: Head atraumatic, normocephalic. Oropharynx and nasopharynx clear.  NECK:  Supple, no jugular venous distention. No thyroid enlargement, no tenderness.  LUNGS: Normal breath sounds bilaterally, no wheezing, rales, rhonchi. No use of accessory muscles of respiration.  CARDIOVASCULAR: S1, S2 normal. No murmurs, rubs, or gallops.  ABDOMEN: Soft, nontender, nondistended. Bowel sounds present. No organomegaly or mass.  EXTREMITIES: No cyanosis, clubbing or edema b/l.    NEUROLOGIC: Unable to straight leg raise but able to flex and extend at the ankle.  Legs very stiff.  Able to lift both arms up.  Follows some simple commands.   PSYCHIATRIC:  patient is alert and oriented x 1.  SKIN:  Pressure Injury 08/22/19 Buttocks Right;Left Stage 2 -  Partial thickness loss of dermis presenting as a shallow open injury with a red, pink wound bed without slough.  (Active)  08/22/19 2100  Location: Buttocks  Location Orientation: Right;Left  Staging: Stage 2 -  Partial thickness loss of dermis presenting as a shallow open injury with a red, pink wound bed without slough.  Wound Description (Comments):   Present on Admission: Yes   LABORATORY PANEL:  CBC Recent Labs  Lab 09/03/19 1827  WBC 12.8*  HGB 11.4*  HCT 35.6*  PLT 543*    Chemistries  Recent Labs  Lab 09/02/19 0407 09/03/19 0511 09/04/19 0443  NA 143   < > 142  K 4.4   < > 4.5  CL 112*   < > 106  CO2 26   < > 28  GLUCOSE 113*   < > 103*  BUN 14   < > 14  CREATININE 0.73   < > 0.86  CALCIUM 8.3*   < > 7.9*  MG 2.3  --   --    < > = values in this interval not displayed.   Cardiac Enzymes No results for input(s): TROPONINI in the last 168 hours. RADIOLOGY:  No results found. ASSESSMENT AND PLAN:   Tanny Harnack  is a 69 y.o. African-American female with a known history of sarcoidosis, obstructive sleep apnea, hypopituitarism, dyslipidemia and CVA, who presented to the emergency room with acute onset of altered mental status with incoherence and confusion  Hypernatremia and hypokalemia.   -electrolytes much improved. Patient drinking water this morning. Good urine output. -Encourage oral fluid intake  -avoid nephrotoxins  2.Acute metabolic encephalopathy, expressive  aphasia.   -MRI of the brain negative for acute infarct.   -Patient being treated for panhypopituitarism.  - Patient started her improvement with stress dose steroids.  - now converted over to oral medications at this point.  Continue oral hydrocortisone twice daily dosing.  3. Acute cystitis-recurrent Treated with antibiotics.  Completed course.  4.Severe sepsis, present on admission.  Enterococcus faecalis on UTI. -now resolved  5. Chronic Stage II sacral decubitus present on admission see description as above Cont care per RN  6. Hypothyroidism unspecified.  TSH low.  On levothyroxine.  7.  Hyperlipidemia on statin  8.ADHD.  Adderall was held--will resume it  9.Weakness.  PT and OT consultations.  PT recommended rehab  10.Recurrent DVT.  On Xarelto  11. Depression.  Continue Prozac   CODE STATUS: Full code  Status is: Inpatient  Dispo: The patient is from: SNF  Anticipated d/c is to: SNF  Anticipated d/c date is: will need rehab bed. Pt can discharge once rehab bed is obtained. Improving slowly and labs with vitals remain stable  Patient currently ismedically  Best at baseline and stable to d/c.   TOTAL TIME TAKING CARE OF THIS PATIENT: 25* minutes.  >50% time spent on counselling and coordination of care  Note: This dictation was prepared with Dragon dictation along with smaller phrase technology. Any transcriptional errors that result from this process are unintentional.  [name] M.D    [facility]   CC: Primary care physician; Michail Sermon, MDPatient ID: [name], female   DOB: Jan 10, 1951, 69 y.o.   MRN: [id number]

## 2019-09-05 DIAGNOSIS — E230 Hypopituitarism: Secondary | ICD-10-CM

## 2019-09-05 DIAGNOSIS — F329 Major depressive disorder, single episode, unspecified: Secondary | ICD-10-CM

## 2019-09-05 LAB — GLUCOSE, CAPILLARY
Glucose-Capillary: 63 mg/dL — ABNORMAL LOW (ref 70–99)
Glucose-Capillary: 73 mg/dL (ref 70–99)
Glucose-Capillary: 107 mg/dL — ABNORMAL HIGH (ref 70–99)

## 2019-09-05 MED ORDER — NITROFURANTOIN MONOHYD MACRO 100 MG PO CAPS: 100.0000 mg | ORAL_CAPSULE | Freq: Every day | ORAL | 2 refills | Status: AC

## 2019-09-05 MED ORDER — NITROFURANTOIN MONOHYD MACRO 100 MG PO CAPS
100.0000 mg | ORAL_CAPSULE | Freq: Every day | ORAL | Status: DC
  Administered 2019-09-05 – 2019-09-09 (×5): 100 mg via ORAL
  Filled 2019-09-05 (×6): qty 1

## 2019-09-05 NOTE — TOC Progression Note (Signed)
Transition of Care [facility]) - Progression Note    Patient Details  Name: NAYLEA WIGINGTON MRN: [id number] Date of Birth: 07-31-1950  [facility] CM/SW Contact  Maree Krabbe, LCSW Phone Number: 09/05/2019, 10:09 AM  Clinical Narrative:   Insurance auth sent to peer to peer, instructions given to MD.    Expected Discharge Plan: Skilled Nursing Facility Barriers to Discharge: English as a second language teacher, Continued Medical Work up  Expected Discharge Plan and Services Expected Discharge Plan: Skilled Nursing Facility In-house Referral: Clinical Social Work   Post Acute Care Choice: Skilled Nursing Facility Living arrangements for the past 2 months: Single Family Home Expected Discharge Date: 09/05/19                                     Social Determinants of Health (SDOH) Interventions    Readmission Risk Interventions No flowsheet data found.

## 2019-09-05 NOTE — Progress Notes (Addendum)
Patient ID: Morgan Carpenter, female   DOB: 04-25-1950, 69 y.o.   MRN: [id number]  I am asked to do peer to peer for this pt  Update: called earlier to [facility] peer to peer and after 20 min hold the representative said that the case is under clinical review--therefore no need for peer to peer at present.  Pt medically at baseline and waiting for insurance auth.

## 2019-09-05 NOTE — Discharge Summary (Signed)
[facility] - [place] at [facility]   PATIENT NAME: Morgan Carpenter    MR#:  [id number]  DATE OF BIRTH:  [birth year]  DATE OF ADMISSION:  08/21/2019 ADMITTING PHYSICIAN: Hannah Beat, MD  DATE OF DISCHARGE: 09/05/2019  PRIMARY CARE PHYSICIAN: Michail Sermon, MD    ADMISSION DIAGNOSIS:  Severe sepsis (HCC) [A41.9, R65.20]  DISCHARGE DIAGNOSIS:  Sepsis on admission--resolved Hypernatremia/hypokalemia--resolved acute metabolic encephalopathy suspected due to panhypopituitarism Recurrent Cystitis  SECONDARY DIAGNOSIS:   Past Medical History:  Diagnosis Date   Adrenal insufficiency (HCC)    DDD (degenerative disc disease), cervical    DDD (degenerative disc disease), lumbar    Hemiplegia (HCC)    Hypercholesteremia    Hyperprolactinemia (HCC)    Hypopituitarism (HCC)    Lymphocytic hypophysitis (HCC)    OSA (obstructive sleep apnea)    Patient is Jehovah's Witness    Recurrent deep vein thrombosis (DVT) (HCC)    Sarcoidosis    Stroke (HCC)    Thyroid disease    Vitamin D deficiency     HOSPITAL COURSE:   [name] a69 y.o.African-American femalewith a known history of sarcoidosis, obstructive sleep apnea, hypopituitarism, dyslipidemia and CVA, who presented to the emergency room with acute onset of altered mental status with incoherence and confusion  Hypernatremia and hypokalemia. -electrolytes much improved. Patient drinking water this morning. Good urine output. -Encourage oral fluid intake  -avoid nephrotoxins  2.Acute metabolic encephalopathy, expressive aphasia.  -MRI of the brain negative for acute infarct.  -Patient being treated for panhypopituitarism.  -Patientstarted her improvementwith stress dose steroids--now converted over to oral medications at this point. Continue oral hydrocortisone twice daily dosing.  3. Acute cystitis-recurrentTreated with antibiotics. Completed course. -will place her on chronic  suppressive antibiotic (3rd admission in 2 months) on Macrobid 100 mg qhs  4.Sepsis, present on admission. Enterococcus faecalis on UC -now resolved  5. Chronic Stage II sacral decubitus present on admission see description as above Pressure Injury 08/22/19 Buttocks Right;Left Stage 2 -  Partial thickness loss of dermis presenting as a shallow open injury with a red, pink wound bed without slough. (Active)  08/22/19 2100  Location: Buttocks  Location Orientation: Right;Left  Staging: Stage 2 -  Partial thickness loss of dermis presenting as a shallow open injury with a red, pink wound bed without slough.  Wound Description (Comments):   Present on Admission: Yes  Cont care per RN  6. Hypothyroidism unspecified. TSH low. On levothyroxine.  7. Hyperlipidemia on statin  8.ADHD. on Adderall  9.Weakness. PT and OT consultations. PT recommended rehab  10.Recurrent DVT. On Xarelto  11. Depression. Continue Prozac   CODE STATUS: Full code  Overall slowly improving D/c to rehab today Repeat COVID negative  CONSULTS OBTAINED:  Treatment Team:  Pauletta Browns, MD Lynn Ito, MD  DRUG ALLERGIES:   Allergies  Allergen Reactions   Naprosyn [Naproxen] Hives   Shellfish Allergy Hives   Sulfa Antibiotics Hives    DISCHARGE MEDICATIONS:   Allergies as of 09/05/2019      Reactions   Naprosyn [naproxen] Hives   Shellfish Allergy Hives   Sulfa Antibiotics Hives      Medication List    STOP taking these medications   linezolid 600 MG tablet Commonly known as: ZYVOX   traMADol 50 MG tablet Commonly known as: ULTRAM     TAKE these medications   acetaminophen 500 MG tablet Commonly known as: TYLENOL Take 2 tablets (1,000 mg total) by mouth every 8 (eight) hours.  atorvastatin 40 MG tablet Commonly known as: LIPITOR Take 40 mg by mouth daily.   FLUoxetine 10 MG capsule Commonly known as: PROZAC Take 10 mg by mouth daily.    gabapentin 300 MG capsule Commonly known as: NEURONTIN PLEASE SEE ATTACHED FOR DETAILED DIRECTIONS   hydrocortisone 10 MG tablet Commonly known as: CORTEF Take 1 tablet (10 mg total) by mouth at bedtime.   hydrocortisone 20 MG tablet Commonly known as: CORTEF Take 1 tablet (20 mg total) by mouth daily.   levothyroxine 75 MCG tablet Commonly known as: SYNTHROID PLEASE SEE ATTACHED FOR DETAILED DIRECTIONS   multivitamin capsule Take by mouth.   nitrofurantoin (macrocrystal-monohydrate) 100 MG capsule Commonly known as: MACROBID Take 1 capsule (100 mg total) by mouth at bedtime.   pantoprazole 40 MG tablet Commonly known as: PROTONIX Take 1 tablet (40 mg total) by mouth daily.   rivaroxaban 20 MG Tabs tablet Commonly known as: XARELTO Take 1 tablet (20 mg total) by mouth daily.   tiZANidine 2 MG tablet Commonly known as: ZANAFLEX Take 2 mg by mouth at bedtime as needed.       If you experience worsening of your admission symptoms, develop shortness of breath, life threatening emergency, suicidal or homicidal thoughts you must seek medical attention immediately by calling 911 or calling your MD immediately  if symptoms less severe.  You Must read complete instructions/literature along with all the possible adverse reactions/side effects for all the Medicines you take and that have been prescribed to you. Take any new Medicines after you have completely understood and accept all the possible adverse reactions/side effects.   Please note  You were cared for by a hospitalist during your hospital stay. If you have any questions about your discharge medications or the care you received while you were in the hospital after you are discharged, you can call the unit and asked to speak with the hospitalist on call if the hospitalist that took care of you is not available. Once you are discharged, your primary care physician will handle any further medical issues. Please note that NO  REFILLS for any discharge medications will be authorized once you are discharged, as it is imperative that you return to your primary care physician (or establish a relationship with a primary care physician if you do not have one) for your aftercare needs so that they can reassess your need for medications and monitor your lab values.  DATA REVIEW:   CBC  Recent Labs  Lab 09/03/19 1827  WBC 12.8*  HGB 11.4*  HCT 35.6*  PLT 543*    Chemistries  Recent Labs  Lab 09/02/19 0407 09/03/19 0511 09/04/19 0443  NA 143   < > 142  K 4.4   < > 4.5  CL 112*   < > 106  CO2 26   < > 28  GLUCOSE 113*   < > 103*  BUN 14   < > 14  CREATININE 0.73   < > 0.86  CALCIUM 8.3*   < > 7.9*  MG 2.3  --   --    < > = values in this interval not displayed.    Microbiology Results   Recent Results (from the past 240 hour(s))  SARS Coronavirus 2 by RT PCR (hospital order, performed in [facility] hospital lab) Nasopharyngeal Nasopharyngeal Swab     Status: None   Collection Time: 09/04/19 12:46 PM   Specimen: Nasopharyngeal Swab  Result Value Ref Range Status   SARS  Coronavirus 2 NEGATIVE NEGATIVE Final    Comment: (NOTE) SARS-CoV-2 target nucleic acids are NOT DETECTED. The SARS-CoV-2 RNA is generally detectable in upper and lower respiratory specimens during the acute phase of infection. The lowest concentration of SARS-CoV-2 viral copies this assay can detect is 250 copies / mL. A negative result does not preclude SARS-CoV-2 infection and should not be used as the sole basis for treatment or other patient management decisions.  A negative result may occur with improper specimen collection / handling, submission of specimen other than nasopharyngeal swab, presence of viral mutation(s) within the areas targeted by this assay, and inadequate number of viral copies (<250 copies / mL). A negative result must be combined with clinical observations, patient history, and epidemiological  information. Fact Sheet for Patients:   StrictlyIdeas.no Fact Sheet for Healthcare Providers: BankingDealers.co.za This test is not yet approved or cleared  by the [place] FDA and has been authorized for detection and/or diagnosis of SARS-CoV-2 by FDA under an Emergency Use Authorization (EUA).  This EUA will remain in effect (meaning this test can be used) for the duration of the COVID-19 declaration under Section 564(b)(1) of the Act, 21 U.S.C. section 360bbb-3(b)(1), unless the authorization is terminated or revoked sooner. Performed at [facility], [address], [place], [place] [address]     RADIOLOGY:  No results found.   CODE STATUS:     Code Status Orders  (From admission, onward)         Start     Ordered   08/22/19 0159  Full code  Continuous     08/22/19 0200        Code Status History    Date Active Date Inactive Code Status Order ID Comments User Context   08/01/2019 1143 08/09/2019 2221 Full Code [id number]  Collier Bullock, MD ED   Advance Care Planning Activity       TOTAL TIME TAKING CARE OF THIS PATIENT: *40* minutes.    Fritzi Mandes M.D  [facility]    CC: Primary care physician; Su Monks, MD

## 2019-09-05 NOTE — Care Management Important Message (Signed)
Important Message  Patient Details  Name: Morgan Carpenter MRN: [id number] Date of Birth: 02/14/1951   Medicare Important Message Given:  Yes     [name] 09/05/2019, 2:54 PM

## 2019-09-05 NOTE — Progress Notes (Signed)
SLP Cancellation Note  Patient Details Name: Morgan Carpenter MRN: [id number] DOB: [birth year]   Cancelled treatment:       Reason Eval/Treat Not Completed: (chart reviewed; met w/ pt ). Attempted to talk w/ pt, however, she was disheveled and calling out from her bed. When asked what she needed, she stated she "needed to go outside" then began calling out loudly again. Support given to position in bed as well as encouragement to calm. NSG updated on pt's needs.  ST will hold on cog-lang. tx d/t pt's agitation at this time. Will f/u tomorrow.     Orinda Kenner, MS, CCC-SLP [place] 09/05/2019, 5:23 PM

## 2019-09-06 MED ORDER — TRAMADOL HCL 50 MG PO TABS
50.0000 mg | ORAL_TABLET | Freq: Four times a day (QID) | ORAL | Status: DC | PRN
  Administered 2019-09-06 – 2019-09-10 (×2): 50 mg via ORAL
  Filled 2019-09-06 (×2): qty 1

## 2019-09-06 MED ORDER — OXYCODONE HCL 5 MG PO TABS
5.0000 mg | ORAL_TABLET | Freq: Once | ORAL | Status: AC
  Administered 2019-09-06: 5 mg via ORAL
  Filled 2019-09-06: qty 1

## 2019-09-06 NOTE — Progress Notes (Signed)
Pt is still complaining of 8/10 of pain in right knee after prn tramadol. MD notified via text. Order for one time dose of oxycodone. I will continue to assess.

## 2019-09-06 NOTE — Progress Notes (Signed)
Physical Therapy Treatment Patient Details Name: Morgan Carpenter MRN: [id number] DOB: 06-06-1950 Today's Date: 09/06/2019    History of Present Illness Morgan Carpenter is a 69 year old female admitted for sepsis and UTI, pt came from [facility]. Pt's PMH includes neurosarcoidosis, adrenal insufficiency, degnerative disc disease, OA, DVT, hypopituitarism, OSA, CVA with R side weakness, and lumbar spinal stenosis. Pt presented d/t altered mental status and fever. Treated for UTI, but encephalopathy and fever persisted. Pt started on stress dose steroids on 5/24 with resolution. Pt with expressive aphasia at baseline, but husband reported that it was worsened on admission. Most recent infectious disease MD note reports that pt's expresive aphasia may be returning to baseline.    PT Comments    Pt in bed upon arrival, sitting in a contorted posture, RLE in end range hip IR, both legs in withdrawal position with Right foot essentially under her buttocks. Pt assisted with basic AA/ROM of RUE and BLE, continues to struggle with BLE extension beyond 45 degrees, has what appears to be a withdrawl reflexion the limb once it gets to a intolerant range based on worsening knee pain (~45 degrees in knee, ~45 degrees in hip. Pt remains in a leg withdrawal reflex position preferentially for comfort making it severely difficult to position in such a manner to protect heels, also continues to worsen postural limitations. Pt's Carpenter/o of pain is curious, as she gestures to patella area as painful, however, her position of comfort/antalgia is with increased stretch of soft tissue about the patella, as well as increased patella femoral compression, both of which are typically aggravating factors to an already painful knee. This makes presentation curious for radicular pain source, given patients known LSS and acute neurological changes. Pt continues to have but trace activation of quads on Right, and on left side has no ankle DF, nor  quads when cued, continues to have Left ankle myoclonus which was new last admission, found by this author. Will continue to follow.     Follow Up Recommendations  SNF     Equipment Recommendations  None recommended by PT    Recommendations for Other Services       Precautions / Restrictions Precautions Precautions: Fall Restrictions Weight Bearing Restrictions: No    Mobility  Bed Mobility                  Transfers                    Ambulation/Gait                 Stairs             Wheelchair Mobility    Modified Rankin (Stroke Patients Only)       Balance                                            Cognition Arousal/Alertness: Awake/alert Behavior During Therapy: WFL for tasks assessed/performed Overall Cognitive Status: Difficult to assess                                        Exercises Other Exercises Other Exercises: Recliner: cervbical rotation A/ROM to end range 1x10 bilat Other Exercises: RUE AA/ROM hand to crown of head (old CVA) x10  Other Exercises: BLE heel slides 1x15 bilat (pain at knees) Other Exercises: RLE ankle DF AA/ROM 1x15 (has no active DF on left, but has myoclunus) Other Exercises: Bilat SAQ: unable, very minimal activation in Rt quads, none detected in Left.    General Comments        Pertinent Vitals/Pain Pain Assessment: Faces Faces Pain Scale: Hurts little more Pain Location: with attempted knee extension Pain Descriptors / Indicators: Grimacing    Home Living                      Prior Function            PT Goals (current goals can now be found in the care plan section) Acute Rehab PT Goals Patient Stated Goal: no goals stated PT Goal Formulation: Patient unable to participate in goal setting Time For Goal Achievement: 09/09/19 Potential to Achieve Goals: Poor Progress towards PT goals: Not progressing toward goals - comment(seems to be  neurologically worse in BLE/trunk gross motor control)    Frequency    Min 2X/week      PT Plan Current plan remains appropriate    Co-evaluation              AM-PAC PT "6 Clicks" Mobility   Outcome Measure  Help needed turning from your back to your side while in a flat bed without using bedrails?: Total Help needed moving from lying on your back to sitting on the side of a flat bed without using bedrails?: Total Help needed moving to and from a bed to a chair (including a wheelchair)?: Total Help needed standing up from a chair using your arms (e.g., wheelchair or bedside chair)?: Total Help needed to walk in hospital room?: Total Help needed climbing 3-5 steps with a railing? : Total 6 Click Score: 6    End of Session   Activity Tolerance: Patient tolerated treatment well;Patient limited by pain Patient left: in bed;with bed alarm set   PT Visit Diagnosis: Muscle weakness (generalized) (M62.81) Pain - Right/Left: Left Pain - part of body: Knee     Time: 1530-1550 PT Time Calculation (min) (ACUTE ONLY): 20 min  Charges:  $Neuromuscular Re-education: 8-22 mins                     4:16 PM, 09/06/19 Etta Grandchild, PT, DPT Physical Therapist - [facility]  [phone] ([place])     Morgan Carpenter 09/06/2019, 4:08 PM

## 2019-09-06 NOTE — TOC Progression Note (Addendum)
Transition of Care [facility]) - Progression Note    Patient Details  Name: Morgan Carpenter MRN: [id number] Date of Birth: 08-Jun-1950  Transition of Care [facility]) CM/SW Contact  Shawn Route, RN Phone Number: 09/06/2019, 9:36 AM  Clinical Narrative:     Paul Dykes Provider Relation Cristal Deer, regarding situation where Dr Allena Katz called [employer] [facility] yesterday for appeal and was told it was still under review.  Then they closed case as denied saying they did not hear from physician.  I called [employer] this morning and was told there is nothing that can be done because they denied and closed case.  Waiting to hear back from [place] with [facility].  Updated Dr Allena Katz of information.  Left message with [name] (husband) of insurance denial and to have a return call.    Expected Discharge Plan: Home w Home Health Services Barriers to Discharge: English as a second language teacher, Continued Medical Work up  Expected Discharge Plan and Services Expected Discharge Plan: Home w Home Health Services In-house Referral: Clinical Social Work   Post Acute Care Choice: Skilled Nursing Facility Living arrangements for the past 2 months: Single Family Home Expected Discharge Date: 09/05/19                                     Social Determinants of Health (SDOH) Interventions    Readmission Risk Interventions No flowsheet data found.

## 2019-09-06 NOTE — Progress Notes (Signed)
Pt's IV is leaking and removed.  Pt does not want it to be replaced as she has no other IV meds ordered at this time and anticipates going home.   Tammy, pt's primary RN and Dr Allena Katz aware.

## 2019-09-06 NOTE — Progress Notes (Signed)
Patient ID: Morgan Carpenter, female   DOB: 02-12-1951, 69 y.o.   MRN: [id number]   Spoke with Shanda Bumps a representative at [place] [employer] regarding patient's denial for rehab today at 12;22pm  Denial case number [id number]  Appeal process initiated. Reference number [id number]  Fax no to send clinicals [phone]

## 2019-09-06 NOTE — Progress Notes (Signed)
[place] at [place] NAME: Morgan Carpenter    MR#:  [id number]  DATE OF BIRTH:  [birth year]  SUBJECTIVE:  patient awake alert eating her vanilla magic cup. Slow to respond to answer but answers most questions appropriately.  REVIEW OF SYSTEMS:   Review of Systems  Constitutional: Negative for chills, fever and weight loss.  HENT: Negative for ear discharge, ear pain and nosebleeds.   Eyes: Negative for blurred vision, pain and discharge.  Respiratory: Negative for sputum production, shortness of breath, wheezing and stridor.   Cardiovascular: Negative for chest pain, palpitations, orthopnea and PND.  Gastrointestinal: Negative for abdominal pain, diarrhea, nausea and vomiting.  Genitourinary: Negative for frequency and urgency.  Musculoskeletal: Negative for back pain and joint pain.  Neurological: Negative for sensory change, speech change and focal weakness.  Psychiatric/Behavioral: Negative for depression and hallucinations. The patient is not nervous/anxious.    Tolerating Diet:yes Tolerating PT: SNF  DRUG ALLERGIES:   Allergies  Allergen Reactions  . Naprosyn [Naproxen] Hives  . Shellfish Allergy Hives  . Sulfa Antibiotics Hives    VITALS:  Blood pressure (!) 114/57, pulse 81, temperature 97.6 F (36.4 C), temperature source Oral, resp. rate 19, height 5\' 4"  (1.626 m), weight 84.5 kg, SpO2 100 %.  PHYSICAL EXAMINATION:   Physical Exam.  Mouth/Throat: No oropharyngeal exudate or posterior oropharyngeal edema.  Eyes: Pupils are equal, round, and reactive to light. Conjunctivae and lids are normal.  Neck: Carotid bruit is not present.  Cardiovascular: S1 normal, S2 normal and normal heart sounds.  Respiratory: She has decreased breath sounds in the right lower field and the left lower field. She has no wheezes. She has no rhonchi. She has no rales.  GI: Soft. Bowel sounds are normal. There is no abdominal tenderness.   Musculoskeletal:     Right ankle: Swelling present.     Left ankle: Swelling present.  Neurological: She is alert.  Unable to straight leg raise but able to flex and extend at the ankle.  Legs very stiff.  Able to lift both arms up.  Follows some simple commands.   Skin: Skin is warm. Nails show no clubbing.  Psychiatric: She has a normal mood and affect.   LABORATORY PANEL:  CBC Recent Labs  Lab 09/03/19 1827  WBC 12.8*  HGB 11.4*  HCT 35.6*  PLT 543*    Chemistries  Recent Labs  Lab 09/02/19 0407 09/03/19 0511 09/04/19 0443  NA 143   < > 142  K 4.4   < > 4.5  CL 112*   < > 106  CO2 26   < > 28  GLUCOSE 113*   < > 103*  BUN 14   < > 14  CREATININE 0.73   < > 0.86  CALCIUM 8.3*   < > 7.9*  MG 2.3  --   --    < > = values in this interval not displayed.   Cardiac Enzymes No results for input(s): TROPONINI in the last 168 hours. RADIOLOGY:  No results found. ASSESSMENT AND PLAN:   [name] a69 y.o.African-American femalewith a known history of sarcoidosis, obstructive sleep apnea, hypopituitarism, dyslipidemia and CVA, who presented to the emergency room with acute onset of altered mental status with incoherence and confusion Hypernatremia and hypokalemia. -electrolytes much improved. Patient drinking water this morning. Good urine output. -Encourage oral fluid intake -avoid nephrotoxins  2.Acute metabolic encephalopathy, expressive aphasia. -MRI of the brain negative for acute infarct. -  Patient being treated for panhypopituitarism. -Patientstarted her improvementwith stress dose steroids--nowconverted over to oral medications at this point. -Continue oral hydrocortisone twice daily dosing.  3.Acute cystitis-recurrentTreated with antibiotics. Completed course. -will place her on chronic suppressive antibiotic (3rd admission in 2 months) on Macrobid 100 mg qhs  4.Sepsis, present on admission. Enterococcus faecalis on UC -now  resolved  5. ChronicStage II sacral decubitus present on admission see descriptionas above Pressure Injury 08/22/19 Buttocks Right;Left Stage 2 -  Partial thickness loss of dermis presenting as a shallow open injury with a red, pink wound bed without slough. (Active)  08/22/19 2100  Location: Buttocks  Location Orientation: Right;Left  Staging: Stage 2 -  Partial thickness loss of dermis presenting as a shallow open injury with a red, pink wound bed without slough.  Wound Description (Comments):   Present on Admission: Yes  Cont care per RN  6.Hypothyroidism unspecified. TSH low. On levothyroxine.  7.Hyperlipidemia on statin  8.ADHD. on Adderall  9.Weakness. PT and OT consultations. PT recommended rehab  10.Recurrent DVT. On Xarelto  11.Depression. Continue Prozac   CODE STATUS: Full code  Overall slowly improving Repeat COVID negative  Status is: Inpatient  Remains inpatient appropriate because:Unsafe d/c plan   Dispo: The patient is from: Home              Anticipated d/c is to: TBD               Anticipated d/c date is: is ready for d/c              Patient currently is medically stable to d/c.pending insurance appeal process See detail note about the appeal written earlier today       TOTAL TIME TAKING CARE OF THIS PATIENT: **30 minutes.  >50% time spent on counselling and coordination of care  Note: This dictation was prepared with Dragon dictation along with smaller phrase technology. Any transcriptional errors that result from this process are unintentional.  Morgan Carpenter M.D    [facility]   CC: Primary care physician; Morgan Carpenter, MDPatient ID: Morgan Carpenter, female   DOB: 1950-05-26, 69 y.o.   MRN: [id number]

## 2019-09-06 NOTE — Progress Notes (Signed)
Pt complaining of 10/10 pain after prn tylenol. MD notified via text. Order for ultram received. I will continue to assess.

## 2019-09-06 NOTE — Progress Notes (Signed)
  Speech Language Pathology Treatment: Cognitive-Linquistic  Patient Details Name: Morgan Carpenter MRN: [id number] DOB: 09-Oct-1950 Today's Date: 09/06/2019 Time: 0909-0920 SLP Time Calculation (min) (ACUTE ONLY): 11 min  Assessment / Plan / Recommendation Clinical Impression  Skilled treatment session targeted pt's cognitive communication deficits. SLP facilitated increase expression by providing moderate sentence completion tasks. Additionally, SLP provided maximal assistance to locate times on her tray such as her coffee and utensils. Pt was using a straw as a spoon with her grits. At this time, pt's expressive abilities are improved over previous session. Will defer all further ST services to next venue of care for most functional     HPI HPI: Pt is a 69 y.o. female African-American female with a known history of Hypopituitarism, hemiplegia, DDD, DVT, sarcoidosis, obstructive sleep apnea, hypopituitarism on low dose Prednisone, dyslipidemia and CVA (2009) with residual right hemiparesis, who presented to the emergency room with acute onset of altered mental status with incoherence and confusion.  Pt from [facility] where she is receiving rehab services for mobility. The patient was diagnosed with UTI.  MRI (08/27/2019) was negative for acute infarct with chronic left MCA infact.       SLP Plan  All goals met       Recommendations   further treatment at [facility]. Rutherford Nail M.S., CCC-SLP, [place] Office [phone]  Follow up Recommendations: Skilled Nursing facility SLP Visit Diagnosis: Dysphagia, oral phase (R13.11);Aphasia (R47.01) Plan: All goals met       GO                Richerd Grime 09/06/2019, 10:22 AM

## 2019-09-07 LAB — CBC
HCT: 34 % — ABNORMAL LOW (ref 36.0–46.0)
Hemoglobin: 10.8 g/dL — ABNORMAL LOW (ref 12.0–15.0)
MCH: 28.2 pg (ref 26.0–34.0)
MCHC: 31.8 g/dL (ref 30.0–36.0)
MCV: 88.8 fL (ref 80.0–100.0)
Platelets: 435 10*3/uL — ABNORMAL HIGH (ref 150–400)
RBC: 3.83 MIL/uL — ABNORMAL LOW (ref 3.87–5.11)
RDW: 20.6 % — ABNORMAL HIGH (ref 11.5–15.5)
WBC: 8.1 10*3/uL (ref 4.0–10.5)
nRBC: 0 % (ref 0.0–0.2)

## 2019-09-07 NOTE — Progress Notes (Signed)
[place] at [place] NAME: Morgan Carpenter    MR#:  [id number]  DATE OF BIRTH:  10-29-1950  SUBJECTIVE:  patient awake alert . Slow to respond to answer but answers most questions appropriately.  REVIEW OF SYSTEMS:   Review of Systems  Constitutional: Negative for chills, fever and weight loss.  HENT: Negative for ear discharge, ear pain and nosebleeds.   Eyes: Negative for blurred vision, pain and discharge.  Respiratory: Negative for sputum production, shortness of breath, wheezing and stridor.   Cardiovascular: Negative for chest pain, palpitations, orthopnea and PND.  Gastrointestinal: Negative for abdominal pain, diarrhea, nausea and vomiting.  Genitourinary: Negative for frequency and urgency.  Musculoskeletal: Positive for joint pain. Negative for back pain.  Neurological: Negative for sensory change, speech change and focal weakness.  Psychiatric/Behavioral: Negative for depression and hallucinations. The patient is not nervous/anxious.    Tolerating Diet:yes Tolerating PT: SNF  DRUG ALLERGIES:   Allergies  Allergen Reactions  . Naprosyn [Naproxen] Hives  . Shellfish Allergy Hives  . Sulfa Antibiotics Hives    VITALS:  Blood pressure 123/70, pulse 79, temperature 97.9 F (36.6 C), temperature source Oral, resp. rate 18, height 5\' 4"  (1.626 m), weight 83.6 kg, SpO2 100 %.  PHYSICAL EXAMINATION:   Physical Exam.  Mouth/Throat: No oropharyngeal exudate or posterior oropharyngeal edema.  Eyes: Pupils are equal, round, and reactive to light. Conjunctivae and lids are normal.  Neck: Carotid bruit is not present.  Cardiovascular: S1 normal, S2 normal and normal heart sounds.  Respiratory: She has decreased breath sounds in the right lower field and the left lower field. She has no wheezes. She has no rhonchi. She has no rales.  GI: Soft. Bowel sounds are normal. There is no abdominal tenderness.  Musculoskeletal:     Right  ankle: Swelling present.     Left ankle: Swelling present.  Neurological: She is alert.  Unable to straight leg raise but able to flex and extend at the ankle.  Legs very stiff.  Able to lift both arms up.  Follows some simple commands.   Skin: Skin is warm. Nails show no clubbing.  Psychiatric: She has a normal mood and affect.   LABORATORY PANEL:  CBC Recent Labs  Lab 09/07/19 0531  WBC 8.1  HGB 10.8*  HCT 34.0*  PLT 435*    Chemistries  Recent Labs  Lab 09/02/19 0407 09/03/19 0511 09/04/19 0443  NA 143   < > 142  K 4.4   < > 4.5  CL 112*   < > 106  CO2 26   < > 28  GLUCOSE 113*   < > 103*  BUN 14   < > 14  CREATININE 0.73   < > 0.86  CALCIUM 8.3*   < > 7.9*  MG 2.3  --   --    < > = values in this interval not displayed.   Cardiac Enzymes No results for input(s): TROPONINI in the last 168 hours. RADIOLOGY:  No results found. ASSESSMENT AND PLAN:   Morgan Carpenter a69 y.o.African-American femalewith a known history of sarcoidosis, obstructive sleep apnea, hypopituitarism, dyslipidemia and CVA, who presented to the emergency room with acute onset of altered mental status with incoherence and confusion Hypernatremia and hypokalemia. -electrolytes much improved. - Good urine output. -Encourage oral fluid intake -avoid nephrotoxins  2.Acute metabolic encephalopathy, expressive aphasia. -MRI of the brain negative for acute infarct. -Patient being treated for panhypopituitarism. -Patientstarted her improvementwith  stress dose steroids--nowconverted over to oral medications at this point. -Continue oral hydrocortisone twice daily dosing.  3.Acute cystitis-recurrentTreated with antibiotics. Completed course. -will place her on chronic suppressive antibiotic (3rd admission in 2 months) on Macrobid 100 mg qhs  4.Sepsis, present on admission. Enterococcus faecalis on UC -now resolved  5. ChronicStage II sacral decubitus present on  admission see descriptionas above Pressure Injury 08/22/19 Buttocks Right;Left Stage 2 -  Partial thickness loss of dermis presenting as a shallow open injury with a red, pink wound bed without slough. (Active)  08/22/19 2100  Location: Buttocks  Location Orientation: Right;Left  Staging: Stage 2 -  Partial thickness loss of dermis presenting as a shallow open injury with a red, pink wound bed without slough.  Wound Description (Comments):   Present on Admission: Yes  Cont care per RN  6.Hypothyroidism unspecified. TSH low. On levothyroxine.  7.Hyperlipidemia on statin  8.ADHD. on Adderall  9.Weakness. PT and OT consultations. PT recommended rehab  10.Recurrent DVT. On Xarelto  11.Depression. Continue Prozac   CODE STATUS: Full code  Overall slowly improving Repeat COVID negative  Status is: Inpatient  Remains inpatient appropriate because:Unsafe d/c plan   Dispo: The patient is from: Home              Anticipated d/c is to: TBD               Anticipated d/c date is: is ready for d/c              Patient currently is medically stable to d/c.pending insurance appeal process See detail note about the appeal written earlier today       TOTAL TIME TAKING CARE OF THIS PATIENT: 25 minutes.  >50% time spent on counselling and coordination of care  Note: This dictation was prepared with Dragon dictation along with smaller phrase technology. Any transcriptional errors that result from this process are unintentional.  Morgan Carpenter M.D    [facility]   CC: Primary care physician; Michail Sermon, MDPatient ID: Morgan Carpenter, female   DOB: [birth year], 69 y.o.   MRN: [id number]

## 2019-09-08 NOTE — Progress Notes (Signed)
[place] at [place] NAME: Morgan Carpenter    MR#:  [id number]  DATE OF BIRTH:  1951/03/23  SUBJECTIVE:  patient awake alert . Slow to respond to answer but answers most questions appropriately. No new issues per RN REVIEW OF SYSTEMS:   Review of Systems  Constitutional: Negative for chills, fever and weight loss.  HENT: Negative for ear discharge, ear pain and nosebleeds.   Eyes: Negative for blurred vision, pain and discharge.  Respiratory: Negative for sputum production, shortness of breath, wheezing and stridor.   Cardiovascular: Negative for chest pain, palpitations, orthopnea and PND.  Gastrointestinal: Negative for abdominal pain, diarrhea, nausea and vomiting.  Genitourinary: Negative for frequency and urgency.  Musculoskeletal: Positive for joint pain. Negative for back pain.  Neurological: Negative for sensory change, speech change and focal weakness.  Psychiatric/Behavioral: Negative for depression and hallucinations. The patient is not nervous/anxious.    Tolerating Diet:yes Tolerating PT: SNF  DRUG ALLERGIES:   Allergies  Allergen Reactions  . Naprosyn [Naproxen] Hives  . Shellfish Allergy Hives  . Sulfa Antibiotics Hives    VITALS:  Blood pressure 122/75, pulse 68, temperature 98 F (36.7 C), temperature source Oral, resp. rate 18, height 5\' 4"  (1.626 m), weight 83.9 kg, SpO2 99 %.  PHYSICAL EXAMINATION:   Physical Exam.  Mouth/Throat: No oropharyngeal exudate or posterior oropharyngeal edema.  Eyes: Pupils are equal, round, and reactive to light. Conjunctivae and lids are normal.  Neck: Carotid bruit is not present.  Cardiovascular: S1 normal, S2 normal and normal heart sounds.  Respiratory: She has decreased breath sounds in the right lower field and the left lower field. She has no wheezes. She has no rhonchi. She has no rales.  GI: Soft. Bowel sounds are normal. There is no abdominal tenderness.  Musculoskeletal:      Right ankle: Swelling present.     Left ankle: Swelling present.  Neurological: She is alert.  Unable to straight leg raise but able to flex and extend at the ankle.  Legs very stiff.  Able to lift both arms up.  Follows some simple commands.   Skin: Skin is warm. Nails show no clubbing.  Psychiatric: She has a normal mood and affect.   LABORATORY PANEL:  CBC Recent Labs  Lab 09/07/19 0531  WBC 8.1  HGB 10.8*  HCT 34.0*  PLT 435*    Chemistries  Recent Labs  Lab 09/02/19 0407 09/03/19 0511 09/04/19 0443  NA 143   < > 142  K 4.4   < > 4.5  CL 112*   < > 106  CO2 26   < > 28  GLUCOSE 113*   < > 103*  BUN 14   < > 14  CREATININE 0.73   < > 0.86  CALCIUM 8.3*   < > 7.9*  MG 2.3  --   --    < > = values in this interval not displayed.   Cardiac Enzymes No results for input(s): TROPONINI in the last 168 hours. RADIOLOGY:  No results found. ASSESSMENT AND PLAN:   [name] a69 y.o.African-American femalewith a known history of sarcoidosis, obstructive sleep apnea, hypopituitarism, dyslipidemia and CVA, who presented to the emergency room with acute onset of altered mental status with incoherence and confusion Hypernatremia and hypokalemia. -electrolytes much improved. - Good urine output. -Encourage oral fluid intake -avoid nephrotoxins  2.Acute metabolic encephalopathy, expressive aphasia. -MRI of the brain negative for acute infarct. -Patient being treated for  panhypopituitarism. -Patientstarted her improvementwith stress dose steroids--nowconverted over to oral medications at this point. -Continue oral hydrocortisone twice daily dosing.  3.Acute cystitis-recurrentTreated with antibiotics. Completed course. -will place her on chronic suppressive antibiotic (3rd admission in 2 months) on Macrobid 100 mg qhs  4.Sepsis, present on admission. Enterococcus faecalis on UC -now resolved  5. ChronicStage II sacral decubitus present  on admission see descriptionas above Pressure Injury 08/22/19 Buttocks Right;Left Stage 2 -  Partial thickness loss of dermis presenting as a shallow open injury with a red, pink wound bed without slough. (Active)  08/22/19 2100  Location: Buttocks  Location Orientation: Right;Left  Staging: Stage 2 -  Partial thickness loss of dermis presenting as a shallow open injury with a red, pink wound bed without slough.  Wound Description (Comments):   Present on Admission: Yes  Cont care per RN  6.Hypothyroidism unspecified. TSH low. On levothyroxine.  7.Hyperlipidemia on statin  8.ADHD. on Adderall  9.Weakness. PT and OT consultations. PT recommended rehab  10.Recurrent DVT. On Xarelto  11.Depression. Continue Prozac   CODE STATUS: Full code  Overall slowly improving Repeat COVID negative  Status is: Inpatient  Remains inpatient appropriate because:Unsafe d/c plan   Dispo: The patient is from: Home              Anticipated d/c is to: TBD               Anticipated d/c date is: is ready for d/c              Patient currently is medically stable to d/c.pending insurance appeal process started on 09/06/19      TOTAL TIME TAKING CARE OF THIS PATIENT: 25 minutes.  >50% time spent on counselling and coordination of care  Note: This dictation was prepared with Dragon dictation along with smaller phrase technology. Any transcriptional errors that result from this process are unintentional.  Morgan Carpenter M.D    [facility]   CC: Primary care physician; Morgan Carpenter, MDPatient ID: Morgan Carpenter, female   DOB: [birth year], 69 y.o.   MRN: [id number]

## 2019-09-09 DIAGNOSIS — M4316 Spondylolisthesis, lumbar region: Secondary | ICD-10-CM | POA: Diagnosis not present

## 2019-09-09 NOTE — Progress Notes (Signed)
Per CCMD, patient with 7 beats of VT. RN was in the room helping patient with meds and dinner. She was asymptomatic. Dr. Allena Katz aware. Will continue to monitor.

## 2019-09-09 NOTE — TOC Progression Note (Signed)
Transition of Care [facility]) - Progression Note    Patient Details  Name: ZELIE ASBILL MRN: [id number] Date of Birth: 25-Dec-1950  Transition of Care [facility]) CM/SW Contact  Trenton Founds, RN Phone Number: 09/09/2019, 2:22 PM  Clinical Narrative:   RNCM received call from patient's husband to report that he had gotten a call from [facility] to report that patient had been approved for stay in skilled facility. Relayed that this CM would call to follow up.  RNCM placed call to [facility] and was informed that they had not yet received the approval from [facility]. She reported that although sometimes members get phone calls the actual decision call has to come from [place] and that they hadn't gotten the notification yet.  RNCM will continue to monitor.     Expected Discharge Plan: Home w Home Health Services Barriers to Discharge: English as a second language teacher, Continued Medical Work up  Expected Discharge Plan and Services Expected Discharge Plan: Home w Home Health Services In-house Referral: Clinical Social Work   Post Acute Care Choice: Skilled Nursing Facility Living arrangements for the past 2 months: Single Family Home Expected Discharge Date: 09/05/19                                     Social Determinants of Health (SDOH) Interventions    Readmission Risk Interventions No flowsheet data found.

## 2019-09-09 NOTE — Progress Notes (Signed)
[facility]  - [place] at [facility]   PATIENT NAME: Morgan Carpenter    MR#:  [id number]  DATE OF BIRTH:  [birth year]  SUBJECTIVE:  patient awake alert . No new issues per RN REVIEW OF SYSTEMS:   Review of Systems  Constitutional: Negative for chills, fever and weight loss.  HENT: Negative for ear discharge, ear pain and nosebleeds.   Eyes: Negative for blurred vision, pain and discharge.  Respiratory: Negative for sputum production, shortness of breath, wheezing and stridor.   Cardiovascular: Negative for chest pain, palpitations, orthopnea and PND.  Gastrointestinal: Negative for abdominal pain, diarrhea, nausea and vomiting.  Genitourinary: Negative for frequency and urgency.  Musculoskeletal: Positive for joint pain. Negative for back pain.  Neurological: Negative for sensory change, speech change and focal weakness.  Psychiatric/Behavioral: Negative for depression and hallucinations. The patient is not nervous/anxious.    Tolerating Diet:yes Tolerating PT: SNF  DRUG ALLERGIES:   Allergies  Allergen Reactions  . Naprosyn [Naproxen] Hives  . Shellfish Allergy Hives  . Sulfa Antibiotics Hives    VITALS:  Blood pressure 112/68, pulse 78, temperature 98.5 F (36.9 C), temperature source Oral, resp. rate 18, height 5\' 4"  (1.626 m), weight 81.1 kg, SpO2 100 %.  PHYSICAL EXAMINATION:   Physical Exam.  Mouth/Throat: No oropharyngeal exudate or posterior oropharyngeal edema.  Eyes: Pupils are equal, round, and reactive to light. Conjunctivae and lids are normal.  Neck: Carotid bruit is not present.  Cardiovascular: S1 normal, S2 normal and normal heart sounds.  Respiratory: She has decreased breath sounds in the right lower field and the left lower field. She has no wheezes. She has no rhonchi. She has no rales.  GI: Soft. Bowel sounds are normal. There is no abdominal tenderness.  Musculoskeletal:     Right ankle: Swelling present.     Left ankle: Swelling  present.  Neurological: She is alert.  Unable to straight leg raise but able to flex and extend at the ankle.  Legs very stiff.  Able to lift both arms up.  Follows some simple commands.   Skin: Skin is warm. Nails show no clubbing.  Psychiatric: She has a normal mood and affect.   LABORATORY PANEL:  CBC Recent Labs  Lab 09/07/19 0531  WBC 8.1  HGB 10.8*  HCT 34.0*  PLT 435*    Chemistries  Recent Labs  Lab 09/04/19 0443  NA 142  K 4.5  CL 106  CO2 28  GLUCOSE 103*  BUN 14  CREATININE 0.86  CALCIUM 7.9*   Cardiac Enzymes No results for input(s): TROPONINI in the last 168 hours. RADIOLOGY:  No results found. ASSESSMENT AND PLAN:   Morgan Carpenter a69 y.o.African-American femalewith a known history of sarcoidosis, obstructive sleep apnea, hypopituitarism, dyslipidemia and CVA, who presented to the emergency room with acute onset of altered mental status with incoherence and confusion Hypernatremia and hypokalemia. -electrolytes much improved. - Good urine output. -Encourage oral fluid intake -avoid nephrotoxins  2.Acute metabolic encephalopathy, expressive aphasia. -MRI of the brain negative for acute infarct. -Patient being treated for panhypopituitarism. -Patientstarted her improvementwith stress dose steroids--nowconverted over to oral medications at this point. -Continue oral hydrocortisone twice daily dosing.  3.Acute cystitis-recurrentTreated with antibiotics. Completed course. -will place her on chronic suppressive antibiotic (3rd admission in 2 months) on Macrobid 100 mg qhs  4.Sepsis, present on admission. Enterococcus faecalis on UC -now resolved  5. ChronicStage II sacral decubitus present on admission see descriptionas above Pressure Injury 08/22/19 Buttocks Right;Left Stage 2 -  Partial thickness loss of dermis presenting as a shallow open injury with a red, pink wound bed without slough. (Active)  08/22/19 2100   Location: Buttocks  Location Orientation: Right;Left  Staging: Stage 2 -  Partial thickness loss of dermis presenting as a shallow open injury with a red, pink wound bed without slough.  Wound Description (Comments):   Present on Admission: Yes  Cont care per RN  6.Hypothyroidism unspecified. TSH low. On levothyroxine.  7.Hyperlipidemia on statin  8.ADHD. on Adderall  9.Weakness. PT and OT consultations. PT recommended rehab  10.Recurrent DVT. On Xarelto  11.Depression. Continue Prozac   CODE STATUS: Full code  Overall slowly improving Repeat COVID negative  Status is: Inpatient  Remains inpatient appropriate because:Unsafe d/c plan   Dispo: The patient is from: Home              Anticipated d/c is to: TBD               Anticipated d/c date is: is ready for d/c              Patient currently is medically stable to d/c.pending insurance appeal process started on 09/06/19 per [facility] patient's husband reported that he got a call from [facility] regarding approval for rehab. [employer] has not heard from [facility] yet. Will await final decision regarding discharge planning.     TOTAL TIME TAKING CARE OF THIS PATIENT: 25 minutes.  >50% time spent on counselling and coordination of care  Note: This dictation was prepared with Dragon dictation along with smaller phrase technology. Any transcriptional errors that result from this process are unintentional.  Fritzi Mandes M.D    [facility]   CC: Primary care physician; Su Monks, MDPatient ID: Morgan Carpenter, female   DOB: April 10, 1950, 69 y.o.   MRN: [id number]

## 2019-09-09 NOTE — Care Management Important Message (Signed)
Important Message  Patient Details  Name: Morgan Carpenter MRN: [id number] Date of Birth: 10-03-1950   Medicare Important Message Given:  Yes     [name] 09/09/2019, 12:00 PM

## 2019-09-10 DIAGNOSIS — R4701 Aphasia: Secondary | ICD-10-CM | POA: Diagnosis not present

## 2019-09-10 DIAGNOSIS — Z743 Need for continuous supervision: Secondary | ICD-10-CM | POA: Diagnosis not present

## 2019-09-10 DIAGNOSIS — I824Y9 Acute embolism and thrombosis of unspecified deep veins of unspecified proximal lower extremity: Secondary | ICD-10-CM | POA: Diagnosis not present

## 2019-09-10 DIAGNOSIS — G9341 Metabolic encephalopathy: Secondary | ICD-10-CM | POA: Diagnosis not present

## 2019-09-10 DIAGNOSIS — Z7401 Bed confinement status: Secondary | ICD-10-CM | POA: Diagnosis not present

## 2019-09-10 DIAGNOSIS — M24561 Contracture, right knee: Secondary | ICD-10-CM | POA: Diagnosis not present

## 2019-09-10 DIAGNOSIS — E039 Hypothyroidism, unspecified: Secondary | ICD-10-CM | POA: Diagnosis not present

## 2019-09-10 DIAGNOSIS — N3090 Cystitis, unspecified without hematuria: Secondary | ICD-10-CM | POA: Diagnosis not present

## 2019-09-10 DIAGNOSIS — E876 Hypokalemia: Secondary | ICD-10-CM | POA: Diagnosis not present

## 2019-09-10 DIAGNOSIS — M6281 Muscle weakness (generalized): Secondary | ICD-10-CM | POA: Diagnosis not present

## 2019-09-10 DIAGNOSIS — D869 Sarcoidosis, unspecified: Secondary | ICD-10-CM | POA: Diagnosis not present

## 2019-09-10 DIAGNOSIS — R6520 Severe sepsis without septic shock: Secondary | ICD-10-CM | POA: Diagnosis not present

## 2019-09-10 DIAGNOSIS — N3000 Acute cystitis without hematuria: Secondary | ICD-10-CM | POA: Diagnosis not present

## 2019-09-10 DIAGNOSIS — R1311 Dysphagia, oral phase: Secondary | ICD-10-CM | POA: Diagnosis not present

## 2019-09-10 DIAGNOSIS — F329 Major depressive disorder, single episode, unspecified: Secondary | ICD-10-CM | POA: Diagnosis not present

## 2019-09-10 DIAGNOSIS — E230 Hypopituitarism: Secondary | ICD-10-CM | POA: Diagnosis not present

## 2019-09-10 DIAGNOSIS — R52 Pain, unspecified: Secondary | ICD-10-CM | POA: Diagnosis not present

## 2019-09-10 DIAGNOSIS — M62838 Other muscle spasm: Secondary | ICD-10-CM | POA: Diagnosis not present

## 2019-09-10 DIAGNOSIS — D649 Anemia, unspecified: Secondary | ICD-10-CM | POA: Diagnosis not present

## 2019-09-10 DIAGNOSIS — R5381 Other malaise: Secondary | ICD-10-CM | POA: Diagnosis not present

## 2019-09-10 DIAGNOSIS — U071 COVID-19: Secondary | ICD-10-CM | POA: Diagnosis not present

## 2019-09-10 DIAGNOSIS — R278 Other lack of coordination: Secondary | ICD-10-CM | POA: Diagnosis not present

## 2019-09-10 DIAGNOSIS — M24562 Contracture, left knee: Secondary | ICD-10-CM | POA: Diagnosis not present

## 2019-09-10 DIAGNOSIS — M2550 Pain in unspecified joint: Secondary | ICD-10-CM | POA: Diagnosis not present

## 2019-09-10 DIAGNOSIS — A419 Sepsis, unspecified organism: Secondary | ICD-10-CM | POA: Diagnosis not present

## 2019-09-10 DIAGNOSIS — E870 Hyperosmolality and hypernatremia: Secondary | ICD-10-CM | POA: Diagnosis not present

## 2019-09-10 DIAGNOSIS — R7981 Abnormal blood-gas level: Secondary | ICD-10-CM | POA: Diagnosis not present

## 2019-09-10 LAB — SARS CORONAVIRUS 2 BY RT PCR (HOSPITAL ORDER, PERFORMED IN ~~LOC~~ HOSPITAL LAB): SARS Coronavirus 2: NEGATIVE

## 2019-09-10 MED ORDER — LEVOTHYROXINE SODIUM 75 MCG PO TABS: 75.0000 ug | ORAL_TABLET | Freq: Every day | ORAL | 0 refills | Status: AC

## 2019-09-10 MED ORDER — GABAPENTIN 300 MG PO CAPS: 300.0000 mg | ORAL_CAPSULE | Freq: Two times a day (BID) | ORAL | 0 refills | Status: AC

## 2019-09-10 NOTE — Progress Notes (Signed)
Report was called to facility number provided by SW. Report was provided to [facility], opportunity for questions were made available. IV & telemetry have been removed from patient per protocol for d/c to outside facility. SW notified that report has been called and transport to be set.

## 2019-09-10 NOTE — Discharge Summary (Addendum)
Morgan Carpenter at [facility] NAME: Morgan Carpenter    MR#:  [id number]  DATE OF BIRTH:  1950/07/01  DATE OF ADMISSION:  08/21/2019 ADMITTING PHYSICIAN: Christel Mormon, MD  DATE OF DISCHARGE: 09/10/2019  PRIMARY CARE PHYSICIAN: Su Monks, MD    ADMISSION DIAGNOSIS:  Severe sepsis ([place]) [A41.9, R65.20]  DISCHARGE DIAGNOSIS:  Sepsis on admission--resolved Hypernatremia/hypokalemia--resolved acute metabolic encephalopathy suspected due to panhypopituitarism Recurrent Cystitis--now on chronic suppressive abxs  SECONDARY DIAGNOSIS:   Past Medical History:  Diagnosis Date  . Adrenal insufficiency ([place])   . DDD (degenerative disc disease), cervical   . DDD (degenerative disc disease), lumbar   . Hemiplegia ([place])   . Hypercholesteremia   . Hyperprolactinemia ([place])   . Hypopituitarism ([place])   . Lymphocytic hypophysitis ([place])   . OSA (obstructive sleep apnea)   . Morgan Carpenter is Jehovah's Witness   . Recurrent deep vein thrombosis (DVT) ([place])   . Sarcoidosis   . Stroke ([place])   . Thyroid disease   . Vitamin D deficiency     HOSPITAL COURSE:   Morgan Carpenter a51 y.o.African-American femalewith a known history of sarcoidosis, obstructive sleep apnea, hypopituitarism, dyslipidemia and CVA, who presented to the emergency room with acute onset of altered mental status with incoherence and confusion Hypernatremia and hypokalemia. -electrolytes much improved. - Good urine output. -Encourage oral fluid intake -avoid nephrotoxins  2.Acute metabolic encephalopathy, expressive aphasia--resolved -MRI of the brain negative for acute infarct. -Morgan Carpenter being treated for panhypopituitarism. -Patientstarted her improvementwith stress dose steroids--nowconverted over to oral medications at this point. -Continue oral hydrocortisone twice daily dosing.  3.Acute cystitis-recurrentTreated with antibiotics. Completed course. -will place her on  chronic suppressive antibiotic (3rd admission in 2 months) on Macrobid 100 mg qhs  4.Sepsis, present on admission. Enterococcus faecalis onUC -now resolved  5. ChronicStage II sacral decubitus present on admission see descriptionas above Pressure Injury 08/22/19 Buttocks Right;Left Stage 2 - Partial thickness loss of dermis presenting as a shallow open injury with a red, pink wound bed without slough. (Active)  08/22/19 2100  Location: Buttocks  Location Orientation: Right;Left  Staging: Stage 2 - Partial thickness loss of dermis presenting as a shallow open injury with a red, pink wound bed without slough.  Wound Description (Comments):   Present on Admission: Yes  Cont care per RN  6.Hypothyroidism unspecified. TSH low. On levothyroxine.  7.Hyperlipidemia on statin  8.ADHD. on Adderall  9.Weakness. PT and OT consultations. PT recommended rehab  10.Recurrent DVT. On Xarelto  11.Depression. Continue Prozac   CODE STATUS: Full code  Overall slowly improving Repeat COVID negative 6/2 negative. Covid ordered  Status is: Inpatient  Dispo: The Morgan Carpenter is from: Home  Anticipated d/c is to: Rehab--[facility]  Anticipated d/c date is: is ready for d/c today  Morgan Carpenter currently is medically stable to d/c. Appeal process approved!!!  CONSULTS OBTAINED:  Treatment Team:  Leotis Pain, MD Tsosie Billing, MD  DRUG ALLERGIES:   Allergies  Allergen Reactions  . Naprosyn [Naproxen] Hives  . Shellfish Allergy Hives  . Sulfa Antibiotics Hives    DISCHARGE MEDICATIONS:   Allergies as of 09/10/2019      Reactions   Naprosyn [naproxen] Hives   Shellfish Allergy Hives   Sulfa Antibiotics Hives      Medication List    STOP taking these medications   linezolid 600 MG tablet Commonly known as: ZYVOX   traMADol 50 MG tablet Commonly known as: ULTRAM     TAKE these  medications   acetaminophen  500 MG tablet Commonly known as: TYLENOL Take 2 tablets (1,000 mg total) by mouth every 8 (eight) hours.   atorvastatin 40 MG tablet Commonly known as: LIPITOR Take 40 mg by mouth daily.   FLUoxetine 10 MG capsule Commonly known as: PROZAC Take 10 mg by mouth daily.   gabapentin 300 MG capsule Commonly known as: NEURONTIN Take 1 capsule (300 mg total) by mouth 2 (two) times daily. What changed: See the new instructions.   hydrocortisone 10 MG tablet Commonly known as: CORTEF Take 1 tablet (10 mg total) by mouth at bedtime.   hydrocortisone 20 MG tablet Commonly known as: CORTEF Take 1 tablet (20 mg total) by mouth daily.   levothyroxine 75 MCG tablet Commonly known as: SYNTHROID Take 1 tablet (75 mcg total) by mouth daily before breakfast. What changed: See the new instructions.   multivitamin capsule Take by mouth.   nitrofurantoin (macrocrystal-monohydrate) 100 MG capsule Commonly known as: MACROBID Take 1 capsule (100 mg total) by mouth at bedtime.   pantoprazole 40 MG tablet Commonly known as: PROTONIX Take 1 tablet (40 mg total) by mouth daily.   rivaroxaban 20 MG Tabs tablet Commonly known as: XARELTO Take 1 tablet (20 mg total) by mouth daily.   tiZANidine 2 MG tablet Commonly known as: ZANAFLEX Take 2 mg by mouth at bedtime as needed.       If you experience worsening of your admission symptoms, develop shortness of breath, life threatening emergency, suicidal or homicidal thoughts you must seek medical attention immediately by calling 911 or calling your MD immediately  if symptoms less severe.  You Must read complete instructions/literature along with all the possible adverse reactions/side effects for all the Medicines you take and that have been prescribed to you. Take any new Medicines after you have completely understood and accept all the possible adverse reactions/side effects.   Please note  You were cared for by a hospitalist during your  hospital stay. If you have any questions about your discharge medications or the care you received while you were in the hospital after you are discharged, you can call the unit and asked to speak with the hospitalist on call if the hospitalist that took care of you is not available. Once you are discharged, your primary care physician will handle any further medical issues. Please note that NO REFILLS for any discharge medications will be authorized once you are discharged, as it is imperative that you return to your primary care physician (or establish a relationship with a primary care physician if you do not have one) for your aftercare needs so that they can reassess your need for medications and monitor your lab values.  DATA REVIEW:   CBC  Recent Labs  Lab 09/07/19 0531  WBC 8.1  HGB 10.8*  HCT 34.0*  PLT 435*    Chemistries  Recent Labs  Lab 09/04/19 0443  NA 142  K 4.5  CL 106  CO2 28  GLUCOSE 103*  BUN 14  CREATININE 0.86  CALCIUM 7.9*    Microbiology Results   Recent Results (from the past 240 hour(s))  SARS Coronavirus 2 by RT PCR (hospital order, performed in [facility] hospital lab) Nasopharyngeal Nasopharyngeal Swab     Status: None   Collection Time: 09/04/19 12:46 PM   Specimen: Nasopharyngeal Swab  Result Value Ref Range Status   SARS Coronavirus 2 NEGATIVE NEGATIVE Final    Comment: (NOTE) SARS-CoV-2 target nucleic acids are NOT  DETECTED. The SARS-CoV-2 RNA is generally detectable in upper and lower respiratory specimens during the acute phase of infection. The lowest concentration of SARS-CoV-2 viral copies this assay can detect is 250 copies / mL. A negative result does not preclude SARS-CoV-2 infection and should not be used as the sole basis for treatment or other Morgan Carpenter management decisions.  A negative result may occur with improper specimen collection / handling, submission of specimen other than nasopharyngeal swab, presence of viral  mutation(s) within the areas targeted by this assay, and inadequate number of viral copies (<250 copies / mL). A negative result must be combined with clinical observations, Morgan Carpenter history, and epidemiological information. Fact Sheet for Patients:   BoilerBrush.com.cy Fact Sheet for Healthcare Providers: https://pope.com/ This test is not yet approved or cleared  by the [place] FDA and has been authorized for detection and/or diagnosis of SARS-CoV-2 by FDA under an Emergency Use Authorization (EUA).  This EUA will remain in effect (meaning this test can be used) for the duration of the COVID-19 declaration under Section 564(b)(1) of the Act, 21 U.S.C. section 360bbb-3(b)(1), unless the authorization is terminated or revoked sooner. Performed at [facility], [address] [place], [place] [address]   SARS Coronavirus 2 by RT PCR (hospital order, performed in [facility] hospital lab) Nasopharyngeal Nasopharyngeal Swab     Status: None   Collection Time: 09/10/19 10:48 AM   Specimen: Nasopharyngeal Swab  Result Value Ref Range Status   SARS Coronavirus 2 NEGATIVE NEGATIVE Final    Comment: (NOTE) SARS-CoV-2 target nucleic acids are NOT DETECTED. The SARS-CoV-2 RNA is generally detectable in upper and lower respiratory specimens during the acute phase of infection. The lowest concentration of SARS-CoV-2 viral copies this assay can detect is 250 copies / mL. A negative result does not preclude SARS-CoV-2 infection and should not be used as the sole basis for treatment or other Morgan Carpenter management decisions.  A negative result may occur with improper specimen collection / handling, submission of specimen other than nasopharyngeal swab, presence of viral mutation(s) within the areas targeted by this assay, and inadequate number of viral copies (<250 copies / mL). A negative result must be combined with  clinical observations, Morgan Carpenter history, and epidemiological information. Fact Sheet for Patients:   BoilerBrush.com.cy Fact Sheet for Healthcare Providers: https://pope.com/ This test is not yet approved or cleared  by the [place] FDA and has been authorized for detection and/or diagnosis of SARS-CoV-2 by FDA under an Emergency Use Authorization (EUA).  This EUA will remain in effect (meaning this test can be used) for the duration of the COVID-19 declaration under Section 564(b)(1) of the Act, 21 U.S.C. section 360bbb-3(b)(1), unless the authorization is terminated or revoked sooner. Performed at [facility], [address], [place], [place] [address]     RADIOLOGY:  No results found.   CODE STATUS:     Code Status Orders  (From admission, onward)         Start     Ordered   08/22/19 0159  Full code  Continuous     08/22/19 0200        Code Status History    Date Active Date Inactive Code Status Order ID Comments User Context   08/01/2019 1143 08/09/2019 2221 Full Code [id number]  Lucile Shutters, MD ED   Advance Care Planning Activity       TOTAL TIME TAKING CARE OF THIS Morgan Carpenter: *40* minutes.    [name] M.D  [facility]    CC: Primary care physician; Michail Sermon, MD

## 2019-09-10 NOTE — Progress Notes (Signed)
[facility]  - [place] [facility]   PATIENT NAME: Morgan Carpenter    MR#:  [id number]  DATE OF BIRTH:  Feb 10, 1951  SUBJECTIVE:  patient awake alert .  C/o pain over her knees--has chronic contracture REVIEW OF SYSTEMS:   Review of Systems  Constitutional: Negative for chills, fever and weight loss.  HENT: Negative for ear discharge, ear pain and nosebleeds.   Eyes: Negative for blurred vision, pain and discharge.  Respiratory: Negative for sputum production, shortness of breath, wheezing and stridor.   Cardiovascular: Negative for chest pain, palpitations, orthopnea and PND.  Gastrointestinal: Negative for abdominal pain, diarrhea, nausea and vomiting.  Genitourinary: Negative for frequency and urgency.  Musculoskeletal: Positive for joint pain. Negative for back pain.  Neurological: Negative for sensory change, speech change and focal weakness.  Psychiatric/Behavioral: Negative for depression and hallucinations. The patient is not nervous/anxious.    Tolerating Diet:yes Tolerating PT: SNF  DRUG ALLERGIES:   Allergies  Allergen Reactions  . Naprosyn [Naproxen] Hives  . Shellfish Allergy Hives  . Sulfa Antibiotics Hives    VITALS:  Blood pressure 132/77, pulse 76, temperature 98.1 F (36.7 C), resp. rate 18, height 5\' 4"  (1.626 m), weight 79.6 kg, SpO2 98 %.  PHYSICAL EXAMINATION:   Physical Exam.  Mouth/Throat: No oropharyngeal exudate or posterior oropharyngeal edema.  Eyes: Pupils are equal, round, and reactive to light. Conjunctivae and lids are normal.  Neck: Carotid bruit is not present.  Cardiovascular: S1 normal, S2 normal and normal heart sounds.  Respiratory:  She has no wheezes. She has no rhonchi. She has no rales.  GI: Soft. Bowel sounds are normal. There is no abdominal tenderness.  Musculoskeletal:     Right ankle: minimal Swelling present.     Left ankle: minimalSwelling present.  Neurological: She is alert.  Unable to straight leg  raise but able to flex and extend at the ankle.  Legs very stiff.  Able to lift both arms up.  Skin: Skin is warm. Nails show no clubbing.  Psychiatric: She has a normal mood and affect.   LABORATORY PANEL:  CBC Recent Labs  Lab 09/07/19 0531  WBC 8.1  HGB 10.8*  HCT 34.0*  PLT 435*    Chemistries  Recent Labs  Lab 09/04/19 0443  NA 142  K 4.5  CL 106  CO2 28  GLUCOSE 103*  BUN 14  CREATININE 0.86  CALCIUM 7.9*   Cardiac Enzymes No results for input(s): TROPONINI in the last 168 hours. RADIOLOGY:  No results found. ASSESSMENT AND PLAN:   [name] a69 y.o.African-American femalewith a known history of sarcoidosis, obstructive sleep apnea, hypopituitarism, dyslipidemia and CVA, who presented to the emergency room with acute onset of altered mental status with incoherence and confusion Hypernatremia and hypokalemia. -electrolytes much improved. - Good urine output. -Encourage oral fluid intake -avoid nephrotoxins  2.Acute metabolic encephalopathy, expressive aphasia--resolved -MRI of the brain negative for acute infarct. -Patient being treated for panhypopituitarism. -Patientstarted her improvementwith stress dose steroids--nowconverted over to oral medications at this point. -Continue oral hydrocortisone twice daily dosing.  3.Acute cystitis-recurrentTreated with antibiotics. Completed course. -will place her on chronic suppressive antibiotic (3rd admission in 2 months) on Macrobid 100 mg qhs  4.Sepsis, present on admission. Enterococcus faecalis on UC -now resolved  5. ChronicStage II sacral decubitus present on admission see descriptionas above Pressure Injury 08/22/19 Buttocks Right;Left Stage 2 -  Partial thickness loss of dermis presenting as a shallow open injury with a red, pink wound bed  without slough. (Active)  08/22/19 2100  Location: Buttocks  Location Orientation: Right;Left  Staging: Stage 2 -  Partial thickness  loss of dermis presenting as a shallow open injury with a red, pink wound bed without slough.  Wound Description (Comments):   Present on Admission: Yes  Cont care per RN  6.Hypothyroidism unspecified. TSH low. On levothyroxine.  7.Hyperlipidemia on statin  8.ADHD. on Adderall  9.Weakness. PT and OT consultations. PT recommended rehab  10.Recurrent DVT. On Xarelto  11.Depression. Continue Prozac   CODE STATUS: Full code  Overall slowly improving Repeat COVID negative  Status is: Inpatient  Remains inpatient appropriate because:Unsafe d/c plan   Dispo: The patient is from: Home              Anticipated d/c is to: TBD               Anticipated d/c date is: is ready for d/c              Patient currently is medically stable to d/c.pending insurance appeal process started on 09/06/19 per [facility] patient's husband reported that he got a call from [facility] regarding approval for rehab. [employer] has not heard from [facility] yet. Will await final decision regarding discharge planning.     TOTAL TIME TAKING CARE OF THIS PATIENT: 25 minutes.  >50% time spent on counselling and coordination of care  Note: This dictation was prepared with Dragon dictation along with smaller phrase technology. Any transcriptional errors that result from this process are unintentional.  Morgan Carpenter M.D    [facility]   CC: Primary care physician; Morgan Carpenter, MDPatient ID: Morgan Carpenter, female   DOB: June 08, 1950, 69 y.o.   MRN: [id number]

## 2019-09-10 NOTE — TOC Transition Note (Signed)
Transition of Care [facility]) - CM/SW Discharge Note   Patient Details  Name: Morgan Carpenter MRN: [id number] Date of Birth: 1951-02-14  [facility] CM/SW Contact:  Trenton Founds, RN Phone Number: 09/10/2019, 11:12 AM   Clinical Narrative:   RNCM reached out to [facility] and was directed to [place], CM. Discussed with [name] that patient's husband had gotten notification yesterday that patient was approved. [name] was able to research and find approval and reported to this CM that she would see if she could get notification sent to [place].  RNCM reached out to [facility] with [facility] to discuss approval, she reported that she would reach out to her superiors and return call.  RNCM received call from [facility] with approval that will be good through 6/9. The Berkley Harvey ID# is [id number] and the CM following will be [name] her number is [phone] [facility] notified MD, bedside nurse and facility. Rapid Covid was ordered and EMS paperwork was updated.     Final next level of care: Skilled Nursing Facility Barriers to Discharge: English as a second language teacher, Continued Medical Work up   Patient Goals and CMS Choice Patient states their goals for this hospitalization and ongoing recovery are:: to get pt stronger-- per spouse   Choice offered to / list presented to : Spouse  Discharge Placement              Patient chooses bed at: [facility] Patient to be transferred to facility by: [facility] Name of family member notified: [name] Patient and family notified of of transfer: 09/10/19  Discharge Plan and Services In-house Referral: Clinical Social Work   Post Acute Care Choice: Skilled Nursing Facility                               Social Determinants of Health (SDOH) Interventions     Readmission Risk Interventions No flowsheet data found.

## 2019-09-11 DIAGNOSIS — N3090 Cystitis, unspecified without hematuria: Secondary | ICD-10-CM | POA: Diagnosis not present

## 2019-09-11 DIAGNOSIS — D869 Sarcoidosis, unspecified: Secondary | ICD-10-CM | POA: Diagnosis not present

## 2019-09-11 DIAGNOSIS — E230 Hypopituitarism: Secondary | ICD-10-CM | POA: Diagnosis not present

## 2019-09-11 DIAGNOSIS — G9341 Metabolic encephalopathy: Secondary | ICD-10-CM | POA: Diagnosis not present

## 2019-09-12 DIAGNOSIS — G9341 Metabolic encephalopathy: Secondary | ICD-10-CM | POA: Diagnosis not present

## 2019-09-12 DIAGNOSIS — E870 Hyperosmolality and hypernatremia: Secondary | ICD-10-CM | POA: Diagnosis not present

## 2019-09-12 DIAGNOSIS — E876 Hypokalemia: Secondary | ICD-10-CM | POA: Diagnosis not present

## 2019-09-12 DIAGNOSIS — E230 Hypopituitarism: Secondary | ICD-10-CM | POA: Diagnosis not present

## 2019-09-17 DIAGNOSIS — E230 Hypopituitarism: Secondary | ICD-10-CM | POA: Diagnosis not present

## 2019-09-17 DIAGNOSIS — D649 Anemia, unspecified: Secondary | ICD-10-CM | POA: Diagnosis not present

## 2019-09-17 DIAGNOSIS — E039 Hypothyroidism, unspecified: Secondary | ICD-10-CM | POA: Diagnosis not present

## 2019-09-23 DIAGNOSIS — R52 Pain, unspecified: Secondary | ICD-10-CM | POA: Diagnosis not present

## 2019-10-09 DIAGNOSIS — E230 Hypopituitarism: Secondary | ICD-10-CM | POA: Diagnosis not present

## 2019-10-09 DIAGNOSIS — D869 Sarcoidosis, unspecified: Secondary | ICD-10-CM | POA: Diagnosis not present

## 2019-10-09 DIAGNOSIS — K219 Gastro-esophageal reflux disease without esophagitis: Secondary | ICD-10-CM | POA: Diagnosis not present

## 2019-10-09 DIAGNOSIS — D649 Anemia, unspecified: Secondary | ICD-10-CM | POA: Diagnosis not present

## 2019-10-15 DIAGNOSIS — E039 Hypothyroidism, unspecified: Secondary | ICD-10-CM | POA: Diagnosis not present

## 2019-10-21 DIAGNOSIS — E039 Hypothyroidism, unspecified: Secondary | ICD-10-CM | POA: Diagnosis not present

## 2019-10-21 DIAGNOSIS — L89890 Pressure ulcer of other site, unstageable: Secondary | ICD-10-CM | POA: Diagnosis not present

## 2019-10-24 DIAGNOSIS — G8929 Other chronic pain: Secondary | ICD-10-CM | POA: Diagnosis not present

## 2019-10-25 DIAGNOSIS — U071 COVID-19: Secondary | ICD-10-CM | POA: Diagnosis not present

## 2019-10-28 DIAGNOSIS — L89890 Pressure ulcer of other site, unstageable: Secondary | ICD-10-CM | POA: Diagnosis not present

## 2019-10-28 DIAGNOSIS — N390 Urinary tract infection, site not specified: Secondary | ICD-10-CM | POA: Diagnosis not present

## 2019-10-28 DIAGNOSIS — G629 Polyneuropathy, unspecified: Secondary | ICD-10-CM | POA: Diagnosis not present

## 2019-10-28 DIAGNOSIS — G8929 Other chronic pain: Secondary | ICD-10-CM | POA: Diagnosis not present

## 2019-10-29 DIAGNOSIS — N3090 Cystitis, unspecified without hematuria: Secondary | ICD-10-CM | POA: Diagnosis not present

## 2019-10-31 DIAGNOSIS — N3090 Cystitis, unspecified without hematuria: Secondary | ICD-10-CM | POA: Diagnosis not present

## 2019-10-31 DIAGNOSIS — D649 Anemia, unspecified: Secondary | ICD-10-CM | POA: Diagnosis not present

## 2019-10-31 DIAGNOSIS — M25561 Pain in right knee: Secondary | ICD-10-CM | POA: Diagnosis not present

## 2019-10-31 DIAGNOSIS — L89890 Pressure ulcer of other site, unstageable: Secondary | ICD-10-CM | POA: Diagnosis not present

## 2019-10-31 DIAGNOSIS — E230 Hypopituitarism: Secondary | ICD-10-CM | POA: Diagnosis not present

## 2019-11-01 DIAGNOSIS — U071 COVID-19: Secondary | ICD-10-CM | POA: Diagnosis not present

## 2019-11-04 DIAGNOSIS — L89890 Pressure ulcer of other site, unstageable: Secondary | ICD-10-CM | POA: Diagnosis not present

## 2019-11-04 DIAGNOSIS — L89893 Pressure ulcer of other site, stage 3: Secondary | ICD-10-CM | POA: Diagnosis not present

## 2019-11-08 DIAGNOSIS — Z03818 Encounter for observation for suspected exposure to other biological agents ruled out: Secondary | ICD-10-CM | POA: Diagnosis not present

## 2019-11-11 DIAGNOSIS — L89890 Pressure ulcer of other site, unstageable: Secondary | ICD-10-CM | POA: Diagnosis not present

## 2019-11-18 DIAGNOSIS — L89890 Pressure ulcer of other site, unstageable: Secondary | ICD-10-CM | POA: Diagnosis not present

## 2019-11-19 DIAGNOSIS — M5030 Other cervical disc degeneration, unspecified cervical region: Secondary | ICD-10-CM | POA: Diagnosis not present

## 2019-11-19 DIAGNOSIS — M4805 Spinal stenosis, thoracolumbar region: Secondary | ICD-10-CM | POA: Diagnosis not present

## 2019-11-19 DIAGNOSIS — M47812 Spondylosis without myelopathy or radiculopathy, cervical region: Secondary | ICD-10-CM | POA: Diagnosis not present

## 2019-11-19 DIAGNOSIS — M48062 Spinal stenosis, lumbar region with neurogenic claudication: Secondary | ICD-10-CM | POA: Diagnosis not present

## 2019-11-19 DIAGNOSIS — M4316 Spondylolisthesis, lumbar region: Secondary | ICD-10-CM | POA: Diagnosis not present

## 2019-11-19 DIAGNOSIS — M1288 Other specific arthropathies, not elsewhere classified, other specified site: Secondary | ICD-10-CM | POA: Diagnosis not present

## 2019-11-19 DIAGNOSIS — M439 Deforming dorsopathy, unspecified: Secondary | ICD-10-CM | POA: Diagnosis not present

## 2019-11-19 DIAGNOSIS — M438X6 Other specified deforming dorsopathies, lumbar region: Secondary | ICD-10-CM | POA: Diagnosis not present

## 2019-11-22 DIAGNOSIS — Z03818 Encounter for observation for suspected exposure to other biological agents ruled out: Secondary | ICD-10-CM | POA: Diagnosis not present

## 2019-11-25 DIAGNOSIS — L89890 Pressure ulcer of other site, unstageable: Secondary | ICD-10-CM | POA: Diagnosis not present

## 2019-11-27 DIAGNOSIS — R2681 Unsteadiness on feet: Secondary | ICD-10-CM | POA: Diagnosis not present

## 2019-11-27 DIAGNOSIS — L89890 Pressure ulcer of other site, unstageable: Secondary | ICD-10-CM | POA: Diagnosis not present

## 2019-11-27 DIAGNOSIS — G9341 Metabolic encephalopathy: Secondary | ICD-10-CM | POA: Diagnosis not present

## 2019-11-27 DIAGNOSIS — M5030 Other cervical disc degeneration, unspecified cervical region: Secondary | ICD-10-CM | POA: Diagnosis not present

## 2019-11-27 DIAGNOSIS — R278 Other lack of coordination: Secondary | ICD-10-CM | POA: Diagnosis not present

## 2019-11-27 DIAGNOSIS — R0602 Shortness of breath: Secondary | ICD-10-CM | POA: Diagnosis not present

## 2019-11-27 DIAGNOSIS — M5000 Cervical disc disorder with myelopathy, unspecified cervical region: Secondary | ICD-10-CM | POA: Diagnosis not present

## 2019-11-27 DIAGNOSIS — M5136 Other intervertebral disc degeneration, lumbar region: Secondary | ICD-10-CM | POA: Diagnosis not present

## 2019-11-28 DIAGNOSIS — M5030 Other cervical disc degeneration, unspecified cervical region: Secondary | ICD-10-CM | POA: Diagnosis not present

## 2019-11-28 DIAGNOSIS — R0602 Shortness of breath: Secondary | ICD-10-CM | POA: Diagnosis not present

## 2019-11-28 DIAGNOSIS — R278 Other lack of coordination: Secondary | ICD-10-CM | POA: Diagnosis not present

## 2019-11-28 DIAGNOSIS — G9341 Metabolic encephalopathy: Secondary | ICD-10-CM | POA: Diagnosis not present

## 2019-11-28 DIAGNOSIS — R2681 Unsteadiness on feet: Secondary | ICD-10-CM | POA: Diagnosis not present

## 2019-11-28 DIAGNOSIS — L89890 Pressure ulcer of other site, unstageable: Secondary | ICD-10-CM | POA: Diagnosis not present

## 2019-11-28 DIAGNOSIS — M5136 Other intervertebral disc degeneration, lumbar region: Secondary | ICD-10-CM | POA: Diagnosis not present

## 2019-11-28 DIAGNOSIS — L89893 Pressure ulcer of other site, stage 3: Secondary | ICD-10-CM | POA: Diagnosis not present

## 2019-11-29 DIAGNOSIS — G9341 Metabolic encephalopathy: Secondary | ICD-10-CM | POA: Diagnosis not present

## 2019-11-29 DIAGNOSIS — L89890 Pressure ulcer of other site, unstageable: Secondary | ICD-10-CM | POA: Diagnosis not present

## 2019-11-29 DIAGNOSIS — M5136 Other intervertebral disc degeneration, lumbar region: Secondary | ICD-10-CM | POA: Diagnosis not present

## 2019-11-29 DIAGNOSIS — R0602 Shortness of breath: Secondary | ICD-10-CM | POA: Diagnosis not present

## 2019-11-29 DIAGNOSIS — Z03818 Encounter for observation for suspected exposure to other biological agents ruled out: Secondary | ICD-10-CM | POA: Diagnosis not present

## 2019-11-29 DIAGNOSIS — R278 Other lack of coordination: Secondary | ICD-10-CM | POA: Diagnosis not present

## 2019-11-29 DIAGNOSIS — R2681 Unsteadiness on feet: Secondary | ICD-10-CM | POA: Diagnosis not present

## 2019-11-29 DIAGNOSIS — M5030 Other cervical disc degeneration, unspecified cervical region: Secondary | ICD-10-CM | POA: Diagnosis not present

## 2019-12-02 DIAGNOSIS — R0602 Shortness of breath: Secondary | ICD-10-CM | POA: Diagnosis not present

## 2019-12-02 DIAGNOSIS — M5030 Other cervical disc degeneration, unspecified cervical region: Secondary | ICD-10-CM | POA: Diagnosis not present

## 2019-12-02 DIAGNOSIS — R278 Other lack of coordination: Secondary | ICD-10-CM | POA: Diagnosis not present

## 2019-12-02 DIAGNOSIS — L89890 Pressure ulcer of other site, unstageable: Secondary | ICD-10-CM | POA: Diagnosis not present

## 2019-12-02 DIAGNOSIS — M5136 Other intervertebral disc degeneration, lumbar region: Secondary | ICD-10-CM | POA: Diagnosis not present

## 2019-12-02 DIAGNOSIS — G9341 Metabolic encephalopathy: Secondary | ICD-10-CM | POA: Diagnosis not present

## 2019-12-02 DIAGNOSIS — R2681 Unsteadiness on feet: Secondary | ICD-10-CM | POA: Diagnosis not present

## 2019-12-03 DIAGNOSIS — L89890 Pressure ulcer of other site, unstageable: Secondary | ICD-10-CM | POA: Diagnosis not present

## 2019-12-03 DIAGNOSIS — R2681 Unsteadiness on feet: Secondary | ICD-10-CM | POA: Diagnosis not present

## 2019-12-03 DIAGNOSIS — M5136 Other intervertebral disc degeneration, lumbar region: Secondary | ICD-10-CM | POA: Diagnosis not present

## 2019-12-03 DIAGNOSIS — M5030 Other cervical disc degeneration, unspecified cervical region: Secondary | ICD-10-CM | POA: Diagnosis not present

## 2019-12-03 DIAGNOSIS — G9341 Metabolic encephalopathy: Secondary | ICD-10-CM | POA: Diagnosis not present

## 2019-12-03 DIAGNOSIS — R278 Other lack of coordination: Secondary | ICD-10-CM | POA: Diagnosis not present

## 2019-12-03 DIAGNOSIS — R0602 Shortness of breath: Secondary | ICD-10-CM | POA: Diagnosis not present

## 2019-12-04 DIAGNOSIS — L89890 Pressure ulcer of other site, unstageable: Secondary | ICD-10-CM | POA: Diagnosis not present

## 2019-12-04 DIAGNOSIS — R278 Other lack of coordination: Secondary | ICD-10-CM | POA: Diagnosis not present

## 2019-12-04 DIAGNOSIS — R2681 Unsteadiness on feet: Secondary | ICD-10-CM | POA: Diagnosis not present

## 2019-12-04 DIAGNOSIS — R0602 Shortness of breath: Secondary | ICD-10-CM | POA: Diagnosis not present

## 2019-12-04 DIAGNOSIS — A419 Sepsis, unspecified organism: Secondary | ICD-10-CM | POA: Diagnosis not present

## 2019-12-04 DIAGNOSIS — M5030 Other cervical disc degeneration, unspecified cervical region: Secondary | ICD-10-CM | POA: Diagnosis not present

## 2019-12-04 DIAGNOSIS — M5136 Other intervertebral disc degeneration, lumbar region: Secondary | ICD-10-CM | POA: Diagnosis not present

## 2019-12-05 DIAGNOSIS — L89890 Pressure ulcer of other site, unstageable: Secondary | ICD-10-CM | POA: Diagnosis not present

## 2019-12-05 DIAGNOSIS — R278 Other lack of coordination: Secondary | ICD-10-CM | POA: Diagnosis not present

## 2019-12-05 DIAGNOSIS — R0602 Shortness of breath: Secondary | ICD-10-CM | POA: Diagnosis not present

## 2019-12-05 DIAGNOSIS — M5030 Other cervical disc degeneration, unspecified cervical region: Secondary | ICD-10-CM | POA: Diagnosis not present

## 2019-12-05 DIAGNOSIS — M5136 Other intervertebral disc degeneration, lumbar region: Secondary | ICD-10-CM | POA: Diagnosis not present

## 2019-12-05 DIAGNOSIS — A419 Sepsis, unspecified organism: Secondary | ICD-10-CM | POA: Diagnosis not present

## 2019-12-05 DIAGNOSIS — R2681 Unsteadiness on feet: Secondary | ICD-10-CM | POA: Diagnosis not present

## 2019-12-06 DIAGNOSIS — R278 Other lack of coordination: Secondary | ICD-10-CM | POA: Diagnosis not present

## 2019-12-06 DIAGNOSIS — R0602 Shortness of breath: Secondary | ICD-10-CM | POA: Diagnosis not present

## 2019-12-06 DIAGNOSIS — M5136 Other intervertebral disc degeneration, lumbar region: Secondary | ICD-10-CM | POA: Diagnosis not present

## 2019-12-06 DIAGNOSIS — M5030 Other cervical disc degeneration, unspecified cervical region: Secondary | ICD-10-CM | POA: Diagnosis not present

## 2019-12-06 DIAGNOSIS — A419 Sepsis, unspecified organism: Secondary | ICD-10-CM | POA: Diagnosis not present

## 2019-12-06 DIAGNOSIS — L89890 Pressure ulcer of other site, unstageable: Secondary | ICD-10-CM | POA: Diagnosis not present

## 2019-12-06 DIAGNOSIS — R2681 Unsteadiness on feet: Secondary | ICD-10-CM | POA: Diagnosis not present

## 2019-12-06 DIAGNOSIS — Z03818 Encounter for observation for suspected exposure to other biological agents ruled out: Secondary | ICD-10-CM | POA: Diagnosis not present

## 2019-12-09 DIAGNOSIS — R2681 Unsteadiness on feet: Secondary | ICD-10-CM | POA: Diagnosis not present

## 2019-12-09 DIAGNOSIS — M5136 Other intervertebral disc degeneration, lumbar region: Secondary | ICD-10-CM | POA: Diagnosis not present

## 2019-12-09 DIAGNOSIS — A419 Sepsis, unspecified organism: Secondary | ICD-10-CM | POA: Diagnosis not present

## 2019-12-09 DIAGNOSIS — L89890 Pressure ulcer of other site, unstageable: Secondary | ICD-10-CM | POA: Diagnosis not present

## 2019-12-09 DIAGNOSIS — R278 Other lack of coordination: Secondary | ICD-10-CM | POA: Diagnosis not present

## 2019-12-09 DIAGNOSIS — M5030 Other cervical disc degeneration, unspecified cervical region: Secondary | ICD-10-CM | POA: Diagnosis not present

## 2019-12-09 DIAGNOSIS — R0602 Shortness of breath: Secondary | ICD-10-CM | POA: Diagnosis not present

## 2019-12-10 DIAGNOSIS — M5030 Other cervical disc degeneration, unspecified cervical region: Secondary | ICD-10-CM | POA: Diagnosis not present

## 2019-12-10 DIAGNOSIS — A419 Sepsis, unspecified organism: Secondary | ICD-10-CM | POA: Diagnosis not present

## 2019-12-10 DIAGNOSIS — M5136 Other intervertebral disc degeneration, lumbar region: Secondary | ICD-10-CM | POA: Diagnosis not present

## 2019-12-10 DIAGNOSIS — L89890 Pressure ulcer of other site, unstageable: Secondary | ICD-10-CM | POA: Diagnosis not present

## 2019-12-10 DIAGNOSIS — R2681 Unsteadiness on feet: Secondary | ICD-10-CM | POA: Diagnosis not present

## 2019-12-10 DIAGNOSIS — R0602 Shortness of breath: Secondary | ICD-10-CM | POA: Diagnosis not present

## 2019-12-10 DIAGNOSIS — R278 Other lack of coordination: Secondary | ICD-10-CM | POA: Diagnosis not present

## 2019-12-11 DIAGNOSIS — M5030 Other cervical disc degeneration, unspecified cervical region: Secondary | ICD-10-CM | POA: Diagnosis not present

## 2019-12-11 DIAGNOSIS — R0602 Shortness of breath: Secondary | ICD-10-CM | POA: Diagnosis not present

## 2019-12-11 DIAGNOSIS — R2681 Unsteadiness on feet: Secondary | ICD-10-CM | POA: Diagnosis not present

## 2019-12-11 DIAGNOSIS — L89890 Pressure ulcer of other site, unstageable: Secondary | ICD-10-CM | POA: Diagnosis not present

## 2019-12-11 DIAGNOSIS — R278 Other lack of coordination: Secondary | ICD-10-CM | POA: Diagnosis not present

## 2019-12-11 DIAGNOSIS — R6889 Other general symptoms and signs: Secondary | ICD-10-CM | POA: Diagnosis not present

## 2019-12-11 DIAGNOSIS — M5136 Other intervertebral disc degeneration, lumbar region: Secondary | ICD-10-CM | POA: Diagnosis not present

## 2019-12-11 DIAGNOSIS — M5417 Radiculopathy, lumbosacral region: Secondary | ICD-10-CM | POA: Diagnosis not present

## 2019-12-11 DIAGNOSIS — M4805 Spinal stenosis, thoracolumbar region: Secondary | ICD-10-CM | POA: Diagnosis not present

## 2019-12-11 DIAGNOSIS — A419 Sepsis, unspecified organism: Secondary | ICD-10-CM | POA: Diagnosis not present

## 2019-12-12 DIAGNOSIS — R278 Other lack of coordination: Secondary | ICD-10-CM | POA: Diagnosis not present

## 2019-12-12 DIAGNOSIS — R2681 Unsteadiness on feet: Secondary | ICD-10-CM | POA: Diagnosis not present

## 2019-12-12 DIAGNOSIS — M5136 Other intervertebral disc degeneration, lumbar region: Secondary | ICD-10-CM | POA: Diagnosis not present

## 2019-12-12 DIAGNOSIS — L89890 Pressure ulcer of other site, unstageable: Secondary | ICD-10-CM | POA: Diagnosis not present

## 2019-12-12 DIAGNOSIS — A419 Sepsis, unspecified organism: Secondary | ICD-10-CM | POA: Diagnosis not present

## 2019-12-12 DIAGNOSIS — M5030 Other cervical disc degeneration, unspecified cervical region: Secondary | ICD-10-CM | POA: Diagnosis not present

## 2019-12-12 DIAGNOSIS — R0602 Shortness of breath: Secondary | ICD-10-CM | POA: Diagnosis not present

## 2019-12-13 DIAGNOSIS — R0602 Shortness of breath: Secondary | ICD-10-CM | POA: Diagnosis not present

## 2019-12-13 DIAGNOSIS — L89890 Pressure ulcer of other site, unstageable: Secondary | ICD-10-CM | POA: Diagnosis not present

## 2019-12-13 DIAGNOSIS — Z20822 Contact with and (suspected) exposure to covid-19: Secondary | ICD-10-CM | POA: Diagnosis not present

## 2019-12-13 DIAGNOSIS — M5136 Other intervertebral disc degeneration, lumbar region: Secondary | ICD-10-CM | POA: Diagnosis not present

## 2019-12-13 DIAGNOSIS — R2681 Unsteadiness on feet: Secondary | ICD-10-CM | POA: Diagnosis not present

## 2019-12-13 DIAGNOSIS — M5030 Other cervical disc degeneration, unspecified cervical region: Secondary | ICD-10-CM | POA: Diagnosis not present

## 2019-12-13 DIAGNOSIS — A419 Sepsis, unspecified organism: Secondary | ICD-10-CM | POA: Diagnosis not present

## 2019-12-13 DIAGNOSIS — R278 Other lack of coordination: Secondary | ICD-10-CM | POA: Diagnosis not present

## 2019-12-16 DIAGNOSIS — M5030 Other cervical disc degeneration, unspecified cervical region: Secondary | ICD-10-CM | POA: Diagnosis not present

## 2019-12-16 DIAGNOSIS — R278 Other lack of coordination: Secondary | ICD-10-CM | POA: Diagnosis not present

## 2019-12-16 DIAGNOSIS — A419 Sepsis, unspecified organism: Secondary | ICD-10-CM | POA: Diagnosis not present

## 2019-12-16 DIAGNOSIS — R2681 Unsteadiness on feet: Secondary | ICD-10-CM | POA: Diagnosis not present

## 2019-12-16 DIAGNOSIS — L89890 Pressure ulcer of other site, unstageable: Secondary | ICD-10-CM | POA: Diagnosis not present

## 2019-12-16 DIAGNOSIS — R0602 Shortness of breath: Secondary | ICD-10-CM | POA: Diagnosis not present

## 2019-12-16 DIAGNOSIS — M5136 Other intervertebral disc degeneration, lumbar region: Secondary | ICD-10-CM | POA: Diagnosis not present

## 2019-12-17 DIAGNOSIS — R0602 Shortness of breath: Secondary | ICD-10-CM | POA: Diagnosis not present

## 2019-12-17 DIAGNOSIS — M62838 Other muscle spasm: Secondary | ICD-10-CM | POA: Diagnosis not present

## 2019-12-17 DIAGNOSIS — M5136 Other intervertebral disc degeneration, lumbar region: Secondary | ICD-10-CM | POA: Diagnosis not present

## 2019-12-17 DIAGNOSIS — A419 Sepsis, unspecified organism: Secondary | ICD-10-CM | POA: Diagnosis not present

## 2019-12-17 DIAGNOSIS — M5030 Other cervical disc degeneration, unspecified cervical region: Secondary | ICD-10-CM | POA: Diagnosis not present

## 2019-12-17 DIAGNOSIS — R278 Other lack of coordination: Secondary | ICD-10-CM | POA: Diagnosis not present

## 2019-12-17 DIAGNOSIS — L89890 Pressure ulcer of other site, unstageable: Secondary | ICD-10-CM | POA: Diagnosis not present

## 2019-12-17 DIAGNOSIS — R2681 Unsteadiness on feet: Secondary | ICD-10-CM | POA: Diagnosis not present

## 2019-12-18 DIAGNOSIS — L89890 Pressure ulcer of other site, unstageable: Secondary | ICD-10-CM | POA: Diagnosis not present

## 2019-12-18 DIAGNOSIS — R2681 Unsteadiness on feet: Secondary | ICD-10-CM | POA: Diagnosis not present

## 2019-12-18 DIAGNOSIS — M5136 Other intervertebral disc degeneration, lumbar region: Secondary | ICD-10-CM | POA: Diagnosis not present

## 2019-12-18 DIAGNOSIS — M5030 Other cervical disc degeneration, unspecified cervical region: Secondary | ICD-10-CM | POA: Diagnosis not present

## 2019-12-18 DIAGNOSIS — R0602 Shortness of breath: Secondary | ICD-10-CM | POA: Diagnosis not present

## 2019-12-18 DIAGNOSIS — R278 Other lack of coordination: Secondary | ICD-10-CM | POA: Diagnosis not present

## 2019-12-18 DIAGNOSIS — A419 Sepsis, unspecified organism: Secondary | ICD-10-CM | POA: Diagnosis not present

## 2019-12-19 DIAGNOSIS — M5030 Other cervical disc degeneration, unspecified cervical region: Secondary | ICD-10-CM | POA: Diagnosis not present

## 2019-12-19 DIAGNOSIS — M5136 Other intervertebral disc degeneration, lumbar region: Secondary | ICD-10-CM | POA: Diagnosis not present

## 2019-12-19 DIAGNOSIS — R278 Other lack of coordination: Secondary | ICD-10-CM | POA: Diagnosis not present

## 2019-12-19 DIAGNOSIS — A419 Sepsis, unspecified organism: Secondary | ICD-10-CM | POA: Diagnosis not present

## 2019-12-19 DIAGNOSIS — R0602 Shortness of breath: Secondary | ICD-10-CM | POA: Diagnosis not present

## 2019-12-19 DIAGNOSIS — L89890 Pressure ulcer of other site, unstageable: Secondary | ICD-10-CM | POA: Diagnosis not present

## 2019-12-19 DIAGNOSIS — R2681 Unsteadiness on feet: Secondary | ICD-10-CM | POA: Diagnosis not present

## 2019-12-20 DIAGNOSIS — R2681 Unsteadiness on feet: Secondary | ICD-10-CM | POA: Diagnosis not present

## 2019-12-20 DIAGNOSIS — M5030 Other cervical disc degeneration, unspecified cervical region: Secondary | ICD-10-CM | POA: Diagnosis not present

## 2019-12-20 DIAGNOSIS — R0602 Shortness of breath: Secondary | ICD-10-CM | POA: Diagnosis not present

## 2019-12-20 DIAGNOSIS — R278 Other lack of coordination: Secondary | ICD-10-CM | POA: Diagnosis not present

## 2019-12-20 DIAGNOSIS — L89890 Pressure ulcer of other site, unstageable: Secondary | ICD-10-CM | POA: Diagnosis not present

## 2019-12-20 DIAGNOSIS — M5136 Other intervertebral disc degeneration, lumbar region: Secondary | ICD-10-CM | POA: Diagnosis not present

## 2019-12-20 DIAGNOSIS — A419 Sepsis, unspecified organism: Secondary | ICD-10-CM | POA: Diagnosis not present

## 2019-12-20 DIAGNOSIS — Z03818 Encounter for observation for suspected exposure to other biological agents ruled out: Secondary | ICD-10-CM | POA: Diagnosis not present

## 2019-12-23 DIAGNOSIS — R0602 Shortness of breath: Secondary | ICD-10-CM | POA: Diagnosis not present

## 2019-12-23 DIAGNOSIS — L89890 Pressure ulcer of other site, unstageable: Secondary | ICD-10-CM | POA: Diagnosis not present

## 2019-12-23 DIAGNOSIS — M5030 Other cervical disc degeneration, unspecified cervical region: Secondary | ICD-10-CM | POA: Diagnosis not present

## 2019-12-23 DIAGNOSIS — R278 Other lack of coordination: Secondary | ICD-10-CM | POA: Diagnosis not present

## 2019-12-23 DIAGNOSIS — A419 Sepsis, unspecified organism: Secondary | ICD-10-CM | POA: Diagnosis not present

## 2019-12-23 DIAGNOSIS — M5136 Other intervertebral disc degeneration, lumbar region: Secondary | ICD-10-CM | POA: Diagnosis not present

## 2019-12-23 DIAGNOSIS — R2681 Unsteadiness on feet: Secondary | ICD-10-CM | POA: Diagnosis not present

## 2019-12-24 DIAGNOSIS — M5030 Other cervical disc degeneration, unspecified cervical region: Secondary | ICD-10-CM | POA: Diagnosis not present

## 2019-12-24 DIAGNOSIS — R0602 Shortness of breath: Secondary | ICD-10-CM | POA: Diagnosis not present

## 2019-12-24 DIAGNOSIS — R2681 Unsteadiness on feet: Secondary | ICD-10-CM | POA: Diagnosis not present

## 2019-12-24 DIAGNOSIS — A419 Sepsis, unspecified organism: Secondary | ICD-10-CM | POA: Diagnosis not present

## 2019-12-24 DIAGNOSIS — R278 Other lack of coordination: Secondary | ICD-10-CM | POA: Diagnosis not present

## 2019-12-24 DIAGNOSIS — L89890 Pressure ulcer of other site, unstageable: Secondary | ICD-10-CM | POA: Diagnosis not present

## 2019-12-24 DIAGNOSIS — M5136 Other intervertebral disc degeneration, lumbar region: Secondary | ICD-10-CM | POA: Diagnosis not present

## 2019-12-25 DIAGNOSIS — R6889 Other general symptoms and signs: Secondary | ICD-10-CM | POA: Diagnosis not present

## 2019-12-25 DIAGNOSIS — L89890 Pressure ulcer of other site, unstageable: Secondary | ICD-10-CM | POA: Diagnosis not present

## 2019-12-25 DIAGNOSIS — A419 Sepsis, unspecified organism: Secondary | ICD-10-CM | POA: Diagnosis not present

## 2019-12-25 DIAGNOSIS — R278 Other lack of coordination: Secondary | ICD-10-CM | POA: Diagnosis not present

## 2019-12-25 DIAGNOSIS — R2681 Unsteadiness on feet: Secondary | ICD-10-CM | POA: Diagnosis not present

## 2019-12-25 DIAGNOSIS — M5030 Other cervical disc degeneration, unspecified cervical region: Secondary | ICD-10-CM | POA: Diagnosis not present

## 2019-12-25 DIAGNOSIS — M5136 Other intervertebral disc degeneration, lumbar region: Secondary | ICD-10-CM | POA: Diagnosis not present

## 2019-12-25 DIAGNOSIS — R0602 Shortness of breath: Secondary | ICD-10-CM | POA: Diagnosis not present

## 2019-12-26 DIAGNOSIS — M5136 Other intervertebral disc degeneration, lumbar region: Secondary | ICD-10-CM | POA: Diagnosis not present

## 2019-12-26 DIAGNOSIS — L89890 Pressure ulcer of other site, unstageable: Secondary | ICD-10-CM | POA: Diagnosis not present

## 2019-12-26 DIAGNOSIS — M5030 Other cervical disc degeneration, unspecified cervical region: Secondary | ICD-10-CM | POA: Diagnosis not present

## 2019-12-26 DIAGNOSIS — A419 Sepsis, unspecified organism: Secondary | ICD-10-CM | POA: Diagnosis not present

## 2019-12-26 DIAGNOSIS — R0602 Shortness of breath: Secondary | ICD-10-CM | POA: Diagnosis not present

## 2019-12-26 DIAGNOSIS — R278 Other lack of coordination: Secondary | ICD-10-CM | POA: Diagnosis not present

## 2019-12-26 DIAGNOSIS — R2681 Unsteadiness on feet: Secondary | ICD-10-CM | POA: Diagnosis not present

## 2019-12-27 DIAGNOSIS — L89890 Pressure ulcer of other site, unstageable: Secondary | ICD-10-CM | POA: Diagnosis not present

## 2019-12-27 DIAGNOSIS — R278 Other lack of coordination: Secondary | ICD-10-CM | POA: Diagnosis not present

## 2019-12-27 DIAGNOSIS — M5030 Other cervical disc degeneration, unspecified cervical region: Secondary | ICD-10-CM | POA: Diagnosis not present

## 2019-12-27 DIAGNOSIS — M5136 Other intervertebral disc degeneration, lumbar region: Secondary | ICD-10-CM | POA: Diagnosis not present

## 2019-12-27 DIAGNOSIS — A419 Sepsis, unspecified organism: Secondary | ICD-10-CM | POA: Diagnosis not present

## 2019-12-27 DIAGNOSIS — R2681 Unsteadiness on feet: Secondary | ICD-10-CM | POA: Diagnosis not present

## 2019-12-27 DIAGNOSIS — R0602 Shortness of breath: Secondary | ICD-10-CM | POA: Diagnosis not present

## 2019-12-30 DIAGNOSIS — L89890 Pressure ulcer of other site, unstageable: Secondary | ICD-10-CM | POA: Diagnosis not present

## 2019-12-30 DIAGNOSIS — A419 Sepsis, unspecified organism: Secondary | ICD-10-CM | POA: Diagnosis not present

## 2019-12-30 DIAGNOSIS — M5030 Other cervical disc degeneration, unspecified cervical region: Secondary | ICD-10-CM | POA: Diagnosis not present

## 2019-12-30 DIAGNOSIS — R278 Other lack of coordination: Secondary | ICD-10-CM | POA: Diagnosis not present

## 2019-12-30 DIAGNOSIS — R0602 Shortness of breath: Secondary | ICD-10-CM | POA: Diagnosis not present

## 2019-12-30 DIAGNOSIS — R2681 Unsteadiness on feet: Secondary | ICD-10-CM | POA: Diagnosis not present

## 2019-12-30 DIAGNOSIS — M5136 Other intervertebral disc degeneration, lumbar region: Secondary | ICD-10-CM | POA: Diagnosis not present

## 2019-12-31 DIAGNOSIS — R2681 Unsteadiness on feet: Secondary | ICD-10-CM | POA: Diagnosis not present

## 2019-12-31 DIAGNOSIS — R278 Other lack of coordination: Secondary | ICD-10-CM | POA: Diagnosis not present

## 2019-12-31 DIAGNOSIS — A419 Sepsis, unspecified organism: Secondary | ICD-10-CM | POA: Diagnosis not present

## 2019-12-31 DIAGNOSIS — M5030 Other cervical disc degeneration, unspecified cervical region: Secondary | ICD-10-CM | POA: Diagnosis not present

## 2019-12-31 DIAGNOSIS — L89890 Pressure ulcer of other site, unstageable: Secondary | ICD-10-CM | POA: Diagnosis not present

## 2019-12-31 DIAGNOSIS — M5136 Other intervertebral disc degeneration, lumbar region: Secondary | ICD-10-CM | POA: Diagnosis not present

## 2019-12-31 DIAGNOSIS — R0602 Shortness of breath: Secondary | ICD-10-CM | POA: Diagnosis not present

## 2020-01-01 DIAGNOSIS — A419 Sepsis, unspecified organism: Secondary | ICD-10-CM | POA: Diagnosis not present

## 2020-01-01 DIAGNOSIS — R2681 Unsteadiness on feet: Secondary | ICD-10-CM | POA: Diagnosis not present

## 2020-01-01 DIAGNOSIS — L89890 Pressure ulcer of other site, unstageable: Secondary | ICD-10-CM | POA: Diagnosis not present

## 2020-01-01 DIAGNOSIS — M5030 Other cervical disc degeneration, unspecified cervical region: Secondary | ICD-10-CM | POA: Diagnosis not present

## 2020-01-01 DIAGNOSIS — M5136 Other intervertebral disc degeneration, lumbar region: Secondary | ICD-10-CM | POA: Diagnosis not present

## 2020-01-01 DIAGNOSIS — R0602 Shortness of breath: Secondary | ICD-10-CM | POA: Diagnosis not present

## 2020-01-01 DIAGNOSIS — R278 Other lack of coordination: Secondary | ICD-10-CM | POA: Diagnosis not present

## 2020-01-02 DIAGNOSIS — R278 Other lack of coordination: Secondary | ICD-10-CM | POA: Diagnosis not present

## 2020-01-02 DIAGNOSIS — M5136 Other intervertebral disc degeneration, lumbar region: Secondary | ICD-10-CM | POA: Diagnosis not present

## 2020-01-02 DIAGNOSIS — R2681 Unsteadiness on feet: Secondary | ICD-10-CM | POA: Diagnosis not present

## 2020-01-02 DIAGNOSIS — M5030 Other cervical disc degeneration, unspecified cervical region: Secondary | ICD-10-CM | POA: Diagnosis not present

## 2020-01-02 DIAGNOSIS — A419 Sepsis, unspecified organism: Secondary | ICD-10-CM | POA: Diagnosis not present

## 2020-01-02 DIAGNOSIS — R0602 Shortness of breath: Secondary | ICD-10-CM | POA: Diagnosis not present

## 2020-01-02 DIAGNOSIS — L89890 Pressure ulcer of other site, unstageable: Secondary | ICD-10-CM | POA: Diagnosis not present

## 2020-01-03 DIAGNOSIS — M24561 Contracture, right knee: Secondary | ICD-10-CM | POA: Diagnosis not present

## 2020-01-03 DIAGNOSIS — A419 Sepsis, unspecified organism: Secondary | ICD-10-CM | POA: Diagnosis not present

## 2020-01-03 DIAGNOSIS — Z03818 Encounter for observation for suspected exposure to other biological agents ruled out: Secondary | ICD-10-CM | POA: Diagnosis not present

## 2020-01-03 DIAGNOSIS — L89890 Pressure ulcer of other site, unstageable: Secondary | ICD-10-CM | POA: Diagnosis not present

## 2020-01-03 DIAGNOSIS — M24562 Contracture, left knee: Secondary | ICD-10-CM | POA: Diagnosis not present

## 2020-01-03 DIAGNOSIS — I639 Cerebral infarction, unspecified: Secondary | ICD-10-CM | POA: Diagnosis not present

## 2020-01-03 DIAGNOSIS — R278 Other lack of coordination: Secondary | ICD-10-CM | POA: Diagnosis not present

## 2020-01-03 DIAGNOSIS — M5030 Other cervical disc degeneration, unspecified cervical region: Secondary | ICD-10-CM | POA: Diagnosis not present

## 2020-01-03 DIAGNOSIS — M5136 Other intervertebral disc degeneration, lumbar region: Secondary | ICD-10-CM | POA: Diagnosis not present

## 2020-01-03 DIAGNOSIS — R2681 Unsteadiness on feet: Secondary | ICD-10-CM | POA: Diagnosis not present

## 2020-01-06 DIAGNOSIS — M5136 Other intervertebral disc degeneration, lumbar region: Secondary | ICD-10-CM | POA: Diagnosis not present

## 2020-01-06 DIAGNOSIS — A419 Sepsis, unspecified organism: Secondary | ICD-10-CM | POA: Diagnosis not present

## 2020-01-06 DIAGNOSIS — R2681 Unsteadiness on feet: Secondary | ICD-10-CM | POA: Diagnosis not present

## 2020-01-06 DIAGNOSIS — M5030 Other cervical disc degeneration, unspecified cervical region: Secondary | ICD-10-CM | POA: Diagnosis not present

## 2020-01-06 DIAGNOSIS — L89893 Pressure ulcer of other site, stage 3: Secondary | ICD-10-CM | POA: Diagnosis not present

## 2020-01-06 DIAGNOSIS — L89890 Pressure ulcer of other site, unstageable: Secondary | ICD-10-CM | POA: Diagnosis not present

## 2020-01-06 DIAGNOSIS — I639 Cerebral infarction, unspecified: Secondary | ICD-10-CM | POA: Diagnosis not present

## 2020-01-06 DIAGNOSIS — M24561 Contracture, right knee: Secondary | ICD-10-CM | POA: Diagnosis not present

## 2020-01-06 DIAGNOSIS — M24562 Contracture, left knee: Secondary | ICD-10-CM | POA: Diagnosis not present

## 2020-01-06 DIAGNOSIS — R278 Other lack of coordination: Secondary | ICD-10-CM | POA: Diagnosis not present

## 2020-01-07 DIAGNOSIS — R2681 Unsteadiness on feet: Secondary | ICD-10-CM | POA: Diagnosis not present

## 2020-01-07 DIAGNOSIS — M5030 Other cervical disc degeneration, unspecified cervical region: Secondary | ICD-10-CM | POA: Diagnosis not present

## 2020-01-07 DIAGNOSIS — I639 Cerebral infarction, unspecified: Secondary | ICD-10-CM | POA: Diagnosis not present

## 2020-01-07 DIAGNOSIS — A419 Sepsis, unspecified organism: Secondary | ICD-10-CM | POA: Diagnosis not present

## 2020-01-07 DIAGNOSIS — M24561 Contracture, right knee: Secondary | ICD-10-CM | POA: Diagnosis not present

## 2020-01-07 DIAGNOSIS — M5136 Other intervertebral disc degeneration, lumbar region: Secondary | ICD-10-CM | POA: Diagnosis not present

## 2020-01-07 DIAGNOSIS — R278 Other lack of coordination: Secondary | ICD-10-CM | POA: Diagnosis not present

## 2020-01-07 DIAGNOSIS — L89890 Pressure ulcer of other site, unstageable: Secondary | ICD-10-CM | POA: Diagnosis not present

## 2020-01-07 DIAGNOSIS — M24562 Contracture, left knee: Secondary | ICD-10-CM | POA: Diagnosis not present

## 2020-01-08 DIAGNOSIS — L89890 Pressure ulcer of other site, unstageable: Secondary | ICD-10-CM | POA: Diagnosis not present

## 2020-01-08 DIAGNOSIS — M5030 Other cervical disc degeneration, unspecified cervical region: Secondary | ICD-10-CM | POA: Diagnosis not present

## 2020-01-08 DIAGNOSIS — R2681 Unsteadiness on feet: Secondary | ICD-10-CM | POA: Diagnosis not present

## 2020-01-08 DIAGNOSIS — M24561 Contracture, right knee: Secondary | ICD-10-CM | POA: Diagnosis not present

## 2020-01-08 DIAGNOSIS — I639 Cerebral infarction, unspecified: Secondary | ICD-10-CM | POA: Diagnosis not present

## 2020-01-08 DIAGNOSIS — A419 Sepsis, unspecified organism: Secondary | ICD-10-CM | POA: Diagnosis not present

## 2020-01-08 DIAGNOSIS — M24562 Contracture, left knee: Secondary | ICD-10-CM | POA: Diagnosis not present

## 2020-01-08 DIAGNOSIS — R278 Other lack of coordination: Secondary | ICD-10-CM | POA: Diagnosis not present

## 2020-01-08 DIAGNOSIS — M5136 Other intervertebral disc degeneration, lumbar region: Secondary | ICD-10-CM | POA: Diagnosis not present

## 2020-01-09 DIAGNOSIS — I639 Cerebral infarction, unspecified: Secondary | ICD-10-CM | POA: Diagnosis not present

## 2020-01-09 DIAGNOSIS — M24561 Contracture, right knee: Secondary | ICD-10-CM | POA: Diagnosis not present

## 2020-01-09 DIAGNOSIS — L89890 Pressure ulcer of other site, unstageable: Secondary | ICD-10-CM | POA: Diagnosis not present

## 2020-01-09 DIAGNOSIS — M5136 Other intervertebral disc degeneration, lumbar region: Secondary | ICD-10-CM | POA: Diagnosis not present

## 2020-01-09 DIAGNOSIS — A419 Sepsis, unspecified organism: Secondary | ICD-10-CM | POA: Diagnosis not present

## 2020-01-09 DIAGNOSIS — R2681 Unsteadiness on feet: Secondary | ICD-10-CM | POA: Diagnosis not present

## 2020-01-09 DIAGNOSIS — M5030 Other cervical disc degeneration, unspecified cervical region: Secondary | ICD-10-CM | POA: Diagnosis not present

## 2020-01-09 DIAGNOSIS — M24562 Contracture, left knee: Secondary | ICD-10-CM | POA: Diagnosis not present

## 2020-01-09 DIAGNOSIS — R278 Other lack of coordination: Secondary | ICD-10-CM | POA: Diagnosis not present

## 2020-01-10 DIAGNOSIS — M5136 Other intervertebral disc degeneration, lumbar region: Secondary | ICD-10-CM | POA: Diagnosis not present

## 2020-01-10 DIAGNOSIS — M24562 Contracture, left knee: Secondary | ICD-10-CM | POA: Diagnosis not present

## 2020-01-10 DIAGNOSIS — L89890 Pressure ulcer of other site, unstageable: Secondary | ICD-10-CM | POA: Diagnosis not present

## 2020-01-10 DIAGNOSIS — I639 Cerebral infarction, unspecified: Secondary | ICD-10-CM | POA: Diagnosis not present

## 2020-01-10 DIAGNOSIS — A419 Sepsis, unspecified organism: Secondary | ICD-10-CM | POA: Diagnosis not present

## 2020-01-10 DIAGNOSIS — R2681 Unsteadiness on feet: Secondary | ICD-10-CM | POA: Diagnosis not present

## 2020-01-10 DIAGNOSIS — M24561 Contracture, right knee: Secondary | ICD-10-CM | POA: Diagnosis not present

## 2020-01-10 DIAGNOSIS — M5030 Other cervical disc degeneration, unspecified cervical region: Secondary | ICD-10-CM | POA: Diagnosis not present

## 2020-01-10 DIAGNOSIS — R278 Other lack of coordination: Secondary | ICD-10-CM | POA: Diagnosis not present

## 2020-01-13 DIAGNOSIS — Z86711 Personal history of pulmonary embolism: Secondary | ICD-10-CM | POA: Diagnosis not present

## 2020-01-13 DIAGNOSIS — M5136 Other intervertebral disc degeneration, lumbar region: Secondary | ICD-10-CM | POA: Diagnosis not present

## 2020-01-13 DIAGNOSIS — R2681 Unsteadiness on feet: Secondary | ICD-10-CM | POA: Diagnosis not present

## 2020-01-13 DIAGNOSIS — M24561 Contracture, right knee: Secondary | ICD-10-CM | POA: Diagnosis not present

## 2020-01-13 DIAGNOSIS — M5030 Other cervical disc degeneration, unspecified cervical region: Secondary | ICD-10-CM | POA: Diagnosis not present

## 2020-01-13 DIAGNOSIS — I639 Cerebral infarction, unspecified: Secondary | ICD-10-CM | POA: Diagnosis not present

## 2020-01-13 DIAGNOSIS — N390 Urinary tract infection, site not specified: Secondary | ICD-10-CM | POA: Diagnosis not present

## 2020-01-13 DIAGNOSIS — L89890 Pressure ulcer of other site, unstageable: Secondary | ICD-10-CM | POA: Diagnosis not present

## 2020-01-13 DIAGNOSIS — R278 Other lack of coordination: Secondary | ICD-10-CM | POA: Diagnosis not present

## 2020-01-13 DIAGNOSIS — E230 Hypopituitarism: Secondary | ICD-10-CM | POA: Diagnosis not present

## 2020-01-13 DIAGNOSIS — A419 Sepsis, unspecified organism: Secondary | ICD-10-CM | POA: Diagnosis not present

## 2020-01-13 DIAGNOSIS — M24562 Contracture, left knee: Secondary | ICD-10-CM | POA: Diagnosis not present

## 2020-01-14 DIAGNOSIS — R2681 Unsteadiness on feet: Secondary | ICD-10-CM | POA: Diagnosis not present

## 2020-01-14 DIAGNOSIS — A419 Sepsis, unspecified organism: Secondary | ICD-10-CM | POA: Diagnosis not present

## 2020-01-14 DIAGNOSIS — M5136 Other intervertebral disc degeneration, lumbar region: Secondary | ICD-10-CM | POA: Diagnosis not present

## 2020-01-14 DIAGNOSIS — M24561 Contracture, right knee: Secondary | ICD-10-CM | POA: Diagnosis not present

## 2020-01-14 DIAGNOSIS — M24562 Contracture, left knee: Secondary | ICD-10-CM | POA: Diagnosis not present

## 2020-01-14 DIAGNOSIS — L89890 Pressure ulcer of other site, unstageable: Secondary | ICD-10-CM | POA: Diagnosis not present

## 2020-01-14 DIAGNOSIS — I639 Cerebral infarction, unspecified: Secondary | ICD-10-CM | POA: Diagnosis not present

## 2020-01-14 DIAGNOSIS — N390 Urinary tract infection, site not specified: Secondary | ICD-10-CM | POA: Diagnosis not present

## 2020-01-14 DIAGNOSIS — R278 Other lack of coordination: Secondary | ICD-10-CM | POA: Diagnosis not present

## 2020-01-14 DIAGNOSIS — M5030 Other cervical disc degeneration, unspecified cervical region: Secondary | ICD-10-CM | POA: Diagnosis not present

## 2020-01-15 DIAGNOSIS — A419 Sepsis, unspecified organism: Secondary | ICD-10-CM | POA: Diagnosis not present

## 2020-01-15 DIAGNOSIS — M24561 Contracture, right knee: Secondary | ICD-10-CM | POA: Diagnosis not present

## 2020-01-15 DIAGNOSIS — R2681 Unsteadiness on feet: Secondary | ICD-10-CM | POA: Diagnosis not present

## 2020-01-15 DIAGNOSIS — M24562 Contracture, left knee: Secondary | ICD-10-CM | POA: Diagnosis not present

## 2020-01-15 DIAGNOSIS — L89890 Pressure ulcer of other site, unstageable: Secondary | ICD-10-CM | POA: Diagnosis not present

## 2020-01-15 DIAGNOSIS — M5136 Other intervertebral disc degeneration, lumbar region: Secondary | ICD-10-CM | POA: Diagnosis not present

## 2020-01-15 DIAGNOSIS — R278 Other lack of coordination: Secondary | ICD-10-CM | POA: Diagnosis not present

## 2020-01-15 DIAGNOSIS — L853 Xerosis cutis: Secondary | ICD-10-CM | POA: Diagnosis not present

## 2020-01-15 DIAGNOSIS — L89899 Pressure ulcer of other site, unspecified stage: Secondary | ICD-10-CM | POA: Diagnosis not present

## 2020-01-15 DIAGNOSIS — I639 Cerebral infarction, unspecified: Secondary | ICD-10-CM | POA: Diagnosis not present

## 2020-01-15 DIAGNOSIS — M5030 Other cervical disc degeneration, unspecified cervical region: Secondary | ICD-10-CM | POA: Diagnosis not present

## 2020-01-16 DIAGNOSIS — I639 Cerebral infarction, unspecified: Secondary | ICD-10-CM | POA: Diagnosis not present

## 2020-01-16 DIAGNOSIS — R278 Other lack of coordination: Secondary | ICD-10-CM | POA: Diagnosis not present

## 2020-01-16 DIAGNOSIS — L89890 Pressure ulcer of other site, unstageable: Secondary | ICD-10-CM | POA: Diagnosis not present

## 2020-01-16 DIAGNOSIS — A419 Sepsis, unspecified organism: Secondary | ICD-10-CM | POA: Diagnosis not present

## 2020-01-16 DIAGNOSIS — M5136 Other intervertebral disc degeneration, lumbar region: Secondary | ICD-10-CM | POA: Diagnosis not present

## 2020-01-16 DIAGNOSIS — M5030 Other cervical disc degeneration, unspecified cervical region: Secondary | ICD-10-CM | POA: Diagnosis not present

## 2020-01-16 DIAGNOSIS — M24562 Contracture, left knee: Secondary | ICD-10-CM | POA: Diagnosis not present

## 2020-01-16 DIAGNOSIS — M24561 Contracture, right knee: Secondary | ICD-10-CM | POA: Diagnosis not present

## 2020-01-16 DIAGNOSIS — R2681 Unsteadiness on feet: Secondary | ICD-10-CM | POA: Diagnosis not present

## 2020-01-17 DIAGNOSIS — I639 Cerebral infarction, unspecified: Secondary | ICD-10-CM | POA: Diagnosis not present

## 2020-01-17 DIAGNOSIS — L89890 Pressure ulcer of other site, unstageable: Secondary | ICD-10-CM | POA: Diagnosis not present

## 2020-01-17 DIAGNOSIS — M5030 Other cervical disc degeneration, unspecified cervical region: Secondary | ICD-10-CM | POA: Diagnosis not present

## 2020-01-17 DIAGNOSIS — M24562 Contracture, left knee: Secondary | ICD-10-CM | POA: Diagnosis not present

## 2020-01-17 DIAGNOSIS — A419 Sepsis, unspecified organism: Secondary | ICD-10-CM | POA: Diagnosis not present

## 2020-01-17 DIAGNOSIS — R2681 Unsteadiness on feet: Secondary | ICD-10-CM | POA: Diagnosis not present

## 2020-01-17 DIAGNOSIS — M24561 Contracture, right knee: Secondary | ICD-10-CM | POA: Diagnosis not present

## 2020-01-17 DIAGNOSIS — M5136 Other intervertebral disc degeneration, lumbar region: Secondary | ICD-10-CM | POA: Diagnosis not present

## 2020-01-17 DIAGNOSIS — R278 Other lack of coordination: Secondary | ICD-10-CM | POA: Diagnosis not present

## 2020-01-17 DIAGNOSIS — Z03818 Encounter for observation for suspected exposure to other biological agents ruled out: Secondary | ICD-10-CM | POA: Diagnosis not present

## 2020-01-20 DIAGNOSIS — R278 Other lack of coordination: Secondary | ICD-10-CM | POA: Diagnosis not present

## 2020-01-20 DIAGNOSIS — M24561 Contracture, right knee: Secondary | ICD-10-CM | POA: Diagnosis not present

## 2020-01-20 DIAGNOSIS — M24562 Contracture, left knee: Secondary | ICD-10-CM | POA: Diagnosis not present

## 2020-01-20 DIAGNOSIS — M5136 Other intervertebral disc degeneration, lumbar region: Secondary | ICD-10-CM | POA: Diagnosis not present

## 2020-01-20 DIAGNOSIS — R2681 Unsteadiness on feet: Secondary | ICD-10-CM | POA: Diagnosis not present

## 2020-01-20 DIAGNOSIS — A419 Sepsis, unspecified organism: Secondary | ICD-10-CM | POA: Diagnosis not present

## 2020-01-20 DIAGNOSIS — L89890 Pressure ulcer of other site, unstageable: Secondary | ICD-10-CM | POA: Diagnosis not present

## 2020-01-20 DIAGNOSIS — I639 Cerebral infarction, unspecified: Secondary | ICD-10-CM | POA: Diagnosis not present

## 2020-01-20 DIAGNOSIS — M5030 Other cervical disc degeneration, unspecified cervical region: Secondary | ICD-10-CM | POA: Diagnosis not present

## 2020-01-21 DIAGNOSIS — I639 Cerebral infarction, unspecified: Secondary | ICD-10-CM | POA: Diagnosis not present

## 2020-01-21 DIAGNOSIS — M24561 Contracture, right knee: Secondary | ICD-10-CM | POA: Diagnosis not present

## 2020-01-21 DIAGNOSIS — M5136 Other intervertebral disc degeneration, lumbar region: Secondary | ICD-10-CM | POA: Diagnosis not present

## 2020-01-21 DIAGNOSIS — R2681 Unsteadiness on feet: Secondary | ICD-10-CM | POA: Diagnosis not present

## 2020-01-21 DIAGNOSIS — R278 Other lack of coordination: Secondary | ICD-10-CM | POA: Diagnosis not present

## 2020-01-21 DIAGNOSIS — L89890 Pressure ulcer of other site, unstageable: Secondary | ICD-10-CM | POA: Diagnosis not present

## 2020-01-21 DIAGNOSIS — A419 Sepsis, unspecified organism: Secondary | ICD-10-CM | POA: Diagnosis not present

## 2020-01-21 DIAGNOSIS — M24562 Contracture, left knee: Secondary | ICD-10-CM | POA: Diagnosis not present

## 2020-01-21 DIAGNOSIS — M5030 Other cervical disc degeneration, unspecified cervical region: Secondary | ICD-10-CM | POA: Diagnosis not present

## 2020-01-25 DIAGNOSIS — M5136 Other intervertebral disc degeneration, lumbar region: Secondary | ICD-10-CM | POA: Diagnosis not present

## 2020-01-25 DIAGNOSIS — L89890 Pressure ulcer of other site, unstageable: Secondary | ICD-10-CM | POA: Diagnosis not present

## 2020-01-25 DIAGNOSIS — I639 Cerebral infarction, unspecified: Secondary | ICD-10-CM | POA: Diagnosis not present

## 2020-01-25 DIAGNOSIS — M5030 Other cervical disc degeneration, unspecified cervical region: Secondary | ICD-10-CM | POA: Diagnosis not present

## 2020-01-25 DIAGNOSIS — R278 Other lack of coordination: Secondary | ICD-10-CM | POA: Diagnosis not present

## 2020-01-25 DIAGNOSIS — A419 Sepsis, unspecified organism: Secondary | ICD-10-CM | POA: Diagnosis not present

## 2020-01-25 DIAGNOSIS — R2681 Unsteadiness on feet: Secondary | ICD-10-CM | POA: Diagnosis not present

## 2020-01-25 DIAGNOSIS — M24562 Contracture, left knee: Secondary | ICD-10-CM | POA: Diagnosis not present

## 2020-01-25 DIAGNOSIS — M24561 Contracture, right knee: Secondary | ICD-10-CM | POA: Diagnosis not present

## 2020-01-26 DIAGNOSIS — R278 Other lack of coordination: Secondary | ICD-10-CM | POA: Diagnosis not present

## 2020-01-26 DIAGNOSIS — I639 Cerebral infarction, unspecified: Secondary | ICD-10-CM | POA: Diagnosis not present

## 2020-01-26 DIAGNOSIS — M24561 Contracture, right knee: Secondary | ICD-10-CM | POA: Diagnosis not present

## 2020-01-26 DIAGNOSIS — M5136 Other intervertebral disc degeneration, lumbar region: Secondary | ICD-10-CM | POA: Diagnosis not present

## 2020-01-26 DIAGNOSIS — R2681 Unsteadiness on feet: Secondary | ICD-10-CM | POA: Diagnosis not present

## 2020-01-26 DIAGNOSIS — M5030 Other cervical disc degeneration, unspecified cervical region: Secondary | ICD-10-CM | POA: Diagnosis not present

## 2020-01-26 DIAGNOSIS — L89890 Pressure ulcer of other site, unstageable: Secondary | ICD-10-CM | POA: Diagnosis not present

## 2020-01-26 DIAGNOSIS — A419 Sepsis, unspecified organism: Secondary | ICD-10-CM | POA: Diagnosis not present

## 2020-01-26 DIAGNOSIS — M24562 Contracture, left knee: Secondary | ICD-10-CM | POA: Diagnosis not present

## 2020-01-27 DIAGNOSIS — R278 Other lack of coordination: Secondary | ICD-10-CM | POA: Diagnosis not present

## 2020-01-27 DIAGNOSIS — M24562 Contracture, left knee: Secondary | ICD-10-CM | POA: Diagnosis not present

## 2020-01-27 DIAGNOSIS — R2681 Unsteadiness on feet: Secondary | ICD-10-CM | POA: Diagnosis not present

## 2020-01-27 DIAGNOSIS — M5136 Other intervertebral disc degeneration, lumbar region: Secondary | ICD-10-CM | POA: Diagnosis not present

## 2020-01-27 DIAGNOSIS — I639 Cerebral infarction, unspecified: Secondary | ICD-10-CM | POA: Diagnosis not present

## 2020-01-27 DIAGNOSIS — M5030 Other cervical disc degeneration, unspecified cervical region: Secondary | ICD-10-CM | POA: Diagnosis not present

## 2020-01-27 DIAGNOSIS — A419 Sepsis, unspecified organism: Secondary | ICD-10-CM | POA: Diagnosis not present

## 2020-01-27 DIAGNOSIS — L89890 Pressure ulcer of other site, unstageable: Secondary | ICD-10-CM | POA: Diagnosis not present

## 2020-01-27 DIAGNOSIS — M24561 Contracture, right knee: Secondary | ICD-10-CM | POA: Diagnosis not present

## 2020-01-28 DIAGNOSIS — L89890 Pressure ulcer of other site, unstageable: Secondary | ICD-10-CM | POA: Diagnosis not present

## 2020-01-28 DIAGNOSIS — R2681 Unsteadiness on feet: Secondary | ICD-10-CM | POA: Diagnosis not present

## 2020-01-28 DIAGNOSIS — I639 Cerebral infarction, unspecified: Secondary | ICD-10-CM | POA: Diagnosis not present

## 2020-01-28 DIAGNOSIS — M5030 Other cervical disc degeneration, unspecified cervical region: Secondary | ICD-10-CM | POA: Diagnosis not present

## 2020-01-28 DIAGNOSIS — M5136 Other intervertebral disc degeneration, lumbar region: Secondary | ICD-10-CM | POA: Diagnosis not present

## 2020-01-28 DIAGNOSIS — R278 Other lack of coordination: Secondary | ICD-10-CM | POA: Diagnosis not present

## 2020-01-28 DIAGNOSIS — A419 Sepsis, unspecified organism: Secondary | ICD-10-CM | POA: Diagnosis not present

## 2020-01-28 DIAGNOSIS — M24562 Contracture, left knee: Secondary | ICD-10-CM | POA: Diagnosis not present

## 2020-01-28 DIAGNOSIS — M24561 Contracture, right knee: Secondary | ICD-10-CM | POA: Diagnosis not present

## 2020-01-29 DIAGNOSIS — M5030 Other cervical disc degeneration, unspecified cervical region: Secondary | ICD-10-CM | POA: Diagnosis not present

## 2020-01-29 DIAGNOSIS — A419 Sepsis, unspecified organism: Secondary | ICD-10-CM | POA: Diagnosis not present

## 2020-01-29 DIAGNOSIS — M5136 Other intervertebral disc degeneration, lumbar region: Secondary | ICD-10-CM | POA: Diagnosis not present

## 2020-01-29 DIAGNOSIS — M24562 Contracture, left knee: Secondary | ICD-10-CM | POA: Diagnosis not present

## 2020-01-29 DIAGNOSIS — R2681 Unsteadiness on feet: Secondary | ICD-10-CM | POA: Diagnosis not present

## 2020-01-29 DIAGNOSIS — R278 Other lack of coordination: Secondary | ICD-10-CM | POA: Diagnosis not present

## 2020-01-29 DIAGNOSIS — I639 Cerebral infarction, unspecified: Secondary | ICD-10-CM | POA: Diagnosis not present

## 2020-01-29 DIAGNOSIS — M24561 Contracture, right knee: Secondary | ICD-10-CM | POA: Diagnosis not present

## 2020-01-29 DIAGNOSIS — L89890 Pressure ulcer of other site, unstageable: Secondary | ICD-10-CM | POA: Diagnosis not present

## 2020-01-30 DIAGNOSIS — M24562 Contracture, left knee: Secondary | ICD-10-CM | POA: Diagnosis not present

## 2020-01-30 DIAGNOSIS — M5030 Other cervical disc degeneration, unspecified cervical region: Secondary | ICD-10-CM | POA: Diagnosis not present

## 2020-01-30 DIAGNOSIS — M5136 Other intervertebral disc degeneration, lumbar region: Secondary | ICD-10-CM | POA: Diagnosis not present

## 2020-01-30 DIAGNOSIS — L89890 Pressure ulcer of other site, unstageable: Secondary | ICD-10-CM | POA: Diagnosis not present

## 2020-01-30 DIAGNOSIS — I639 Cerebral infarction, unspecified: Secondary | ICD-10-CM | POA: Diagnosis not present

## 2020-01-30 DIAGNOSIS — R2681 Unsteadiness on feet: Secondary | ICD-10-CM | POA: Diagnosis not present

## 2020-01-30 DIAGNOSIS — A419 Sepsis, unspecified organism: Secondary | ICD-10-CM | POA: Diagnosis not present

## 2020-01-30 DIAGNOSIS — M24561 Contracture, right knee: Secondary | ICD-10-CM | POA: Diagnosis not present

## 2020-01-30 DIAGNOSIS — R278 Other lack of coordination: Secondary | ICD-10-CM | POA: Diagnosis not present

## 2020-01-31 DIAGNOSIS — M24561 Contracture, right knee: Secondary | ICD-10-CM | POA: Diagnosis not present

## 2020-01-31 DIAGNOSIS — A419 Sepsis, unspecified organism: Secondary | ICD-10-CM | POA: Diagnosis not present

## 2020-01-31 DIAGNOSIS — L89890 Pressure ulcer of other site, unstageable: Secondary | ICD-10-CM | POA: Diagnosis not present

## 2020-01-31 DIAGNOSIS — R278 Other lack of coordination: Secondary | ICD-10-CM | POA: Diagnosis not present

## 2020-01-31 DIAGNOSIS — M5136 Other intervertebral disc degeneration, lumbar region: Secondary | ICD-10-CM | POA: Diagnosis not present

## 2020-01-31 DIAGNOSIS — M24562 Contracture, left knee: Secondary | ICD-10-CM | POA: Diagnosis not present

## 2020-01-31 DIAGNOSIS — I639 Cerebral infarction, unspecified: Secondary | ICD-10-CM | POA: Diagnosis not present

## 2020-01-31 DIAGNOSIS — M5030 Other cervical disc degeneration, unspecified cervical region: Secondary | ICD-10-CM | POA: Diagnosis not present

## 2020-01-31 DIAGNOSIS — R2681 Unsteadiness on feet: Secondary | ICD-10-CM | POA: Diagnosis not present

## 2020-02-01 DIAGNOSIS — M24562 Contracture, left knee: Secondary | ICD-10-CM | POA: Diagnosis not present

## 2020-02-01 DIAGNOSIS — R2681 Unsteadiness on feet: Secondary | ICD-10-CM | POA: Diagnosis not present

## 2020-02-01 DIAGNOSIS — M24561 Contracture, right knee: Secondary | ICD-10-CM | POA: Diagnosis not present

## 2020-02-01 DIAGNOSIS — M5030 Other cervical disc degeneration, unspecified cervical region: Secondary | ICD-10-CM | POA: Diagnosis not present

## 2020-02-01 DIAGNOSIS — R278 Other lack of coordination: Secondary | ICD-10-CM | POA: Diagnosis not present

## 2020-02-01 DIAGNOSIS — L89890 Pressure ulcer of other site, unstageable: Secondary | ICD-10-CM | POA: Diagnosis not present

## 2020-02-01 DIAGNOSIS — M5136 Other intervertebral disc degeneration, lumbar region: Secondary | ICD-10-CM | POA: Diagnosis not present

## 2020-02-01 DIAGNOSIS — I639 Cerebral infarction, unspecified: Secondary | ICD-10-CM | POA: Diagnosis not present

## 2020-02-01 DIAGNOSIS — A419 Sepsis, unspecified organism: Secondary | ICD-10-CM | POA: Diagnosis not present

## 2020-02-03 DIAGNOSIS — I639 Cerebral infarction, unspecified: Secondary | ICD-10-CM | POA: Diagnosis not present

## 2020-02-03 DIAGNOSIS — M5030 Other cervical disc degeneration, unspecified cervical region: Secondary | ICD-10-CM | POA: Diagnosis not present

## 2020-02-03 DIAGNOSIS — L89890 Pressure ulcer of other site, unstageable: Secondary | ICD-10-CM | POA: Diagnosis not present

## 2020-02-03 DIAGNOSIS — A419 Sepsis, unspecified organism: Secondary | ICD-10-CM | POA: Diagnosis not present

## 2020-02-03 DIAGNOSIS — R2681 Unsteadiness on feet: Secondary | ICD-10-CM | POA: Diagnosis not present

## 2020-02-03 DIAGNOSIS — R0602 Shortness of breath: Secondary | ICD-10-CM | POA: Diagnosis not present

## 2020-02-03 DIAGNOSIS — R278 Other lack of coordination: Secondary | ICD-10-CM | POA: Diagnosis not present

## 2020-02-03 DIAGNOSIS — M5136 Other intervertebral disc degeneration, lumbar region: Secondary | ICD-10-CM | POA: Diagnosis not present

## 2020-02-03 DIAGNOSIS — M24561 Contracture, right knee: Secondary | ICD-10-CM | POA: Diagnosis not present

## 2020-02-03 DIAGNOSIS — M24562 Contracture, left knee: Secondary | ICD-10-CM | POA: Diagnosis not present

## 2020-02-04 DIAGNOSIS — M5136 Other intervertebral disc degeneration, lumbar region: Secondary | ICD-10-CM | POA: Diagnosis not present

## 2020-02-04 DIAGNOSIS — M24562 Contracture, left knee: Secondary | ICD-10-CM | POA: Diagnosis not present

## 2020-02-04 DIAGNOSIS — R0602 Shortness of breath: Secondary | ICD-10-CM | POA: Diagnosis not present

## 2020-02-04 DIAGNOSIS — I639 Cerebral infarction, unspecified: Secondary | ICD-10-CM | POA: Diagnosis not present

## 2020-02-04 DIAGNOSIS — M5030 Other cervical disc degeneration, unspecified cervical region: Secondary | ICD-10-CM | POA: Diagnosis not present

## 2020-02-04 DIAGNOSIS — R278 Other lack of coordination: Secondary | ICD-10-CM | POA: Diagnosis not present

## 2020-02-04 DIAGNOSIS — R2681 Unsteadiness on feet: Secondary | ICD-10-CM | POA: Diagnosis not present

## 2020-02-04 DIAGNOSIS — L89890 Pressure ulcer of other site, unstageable: Secondary | ICD-10-CM | POA: Diagnosis not present

## 2020-02-04 DIAGNOSIS — M24561 Contracture, right knee: Secondary | ICD-10-CM | POA: Diagnosis not present

## 2020-02-04 DIAGNOSIS — A419 Sepsis, unspecified organism: Secondary | ICD-10-CM | POA: Diagnosis not present

## 2020-02-05 DIAGNOSIS — M5030 Other cervical disc degeneration, unspecified cervical region: Secondary | ICD-10-CM | POA: Diagnosis not present

## 2020-02-05 DIAGNOSIS — L89890 Pressure ulcer of other site, unstageable: Secondary | ICD-10-CM | POA: Diagnosis not present

## 2020-02-05 DIAGNOSIS — R2681 Unsteadiness on feet: Secondary | ICD-10-CM | POA: Diagnosis not present

## 2020-02-05 DIAGNOSIS — M24562 Contracture, left knee: Secondary | ICD-10-CM | POA: Diagnosis not present

## 2020-02-05 DIAGNOSIS — A419 Sepsis, unspecified organism: Secondary | ICD-10-CM | POA: Diagnosis not present

## 2020-02-05 DIAGNOSIS — M24561 Contracture, right knee: Secondary | ICD-10-CM | POA: Diagnosis not present

## 2020-02-05 DIAGNOSIS — R278 Other lack of coordination: Secondary | ICD-10-CM | POA: Diagnosis not present

## 2020-02-05 DIAGNOSIS — R0602 Shortness of breath: Secondary | ICD-10-CM | POA: Diagnosis not present

## 2020-02-05 DIAGNOSIS — M5136 Other intervertebral disc degeneration, lumbar region: Secondary | ICD-10-CM | POA: Diagnosis not present

## 2020-02-05 DIAGNOSIS — I639 Cerebral infarction, unspecified: Secondary | ICD-10-CM | POA: Diagnosis not present

## 2020-02-06 DIAGNOSIS — M24562 Contracture, left knee: Secondary | ICD-10-CM | POA: Diagnosis not present

## 2020-02-06 DIAGNOSIS — R278 Other lack of coordination: Secondary | ICD-10-CM | POA: Diagnosis not present

## 2020-02-06 DIAGNOSIS — M5136 Other intervertebral disc degeneration, lumbar region: Secondary | ICD-10-CM | POA: Diagnosis not present

## 2020-02-06 DIAGNOSIS — I639 Cerebral infarction, unspecified: Secondary | ICD-10-CM | POA: Diagnosis not present

## 2020-02-06 DIAGNOSIS — R2681 Unsteadiness on feet: Secondary | ICD-10-CM | POA: Diagnosis not present

## 2020-02-06 DIAGNOSIS — M5030 Other cervical disc degeneration, unspecified cervical region: Secondary | ICD-10-CM | POA: Diagnosis not present

## 2020-02-06 DIAGNOSIS — M24561 Contracture, right knee: Secondary | ICD-10-CM | POA: Diagnosis not present

## 2020-02-06 DIAGNOSIS — L89890 Pressure ulcer of other site, unstageable: Secondary | ICD-10-CM | POA: Diagnosis not present

## 2020-02-06 DIAGNOSIS — A419 Sepsis, unspecified organism: Secondary | ICD-10-CM | POA: Diagnosis not present

## 2020-02-06 DIAGNOSIS — R0602 Shortness of breath: Secondary | ICD-10-CM | POA: Diagnosis not present

## 2020-02-07 DIAGNOSIS — I639 Cerebral infarction, unspecified: Secondary | ICD-10-CM | POA: Diagnosis not present

## 2020-02-07 DIAGNOSIS — M5030 Other cervical disc degeneration, unspecified cervical region: Secondary | ICD-10-CM | POA: Diagnosis not present

## 2020-02-07 DIAGNOSIS — M24562 Contracture, left knee: Secondary | ICD-10-CM | POA: Diagnosis not present

## 2020-02-07 DIAGNOSIS — M24561 Contracture, right knee: Secondary | ICD-10-CM | POA: Diagnosis not present

## 2020-02-07 DIAGNOSIS — L89890 Pressure ulcer of other site, unstageable: Secondary | ICD-10-CM | POA: Diagnosis not present

## 2020-02-07 DIAGNOSIS — R0602 Shortness of breath: Secondary | ICD-10-CM | POA: Diagnosis not present

## 2020-02-07 DIAGNOSIS — A419 Sepsis, unspecified organism: Secondary | ICD-10-CM | POA: Diagnosis not present

## 2020-02-07 DIAGNOSIS — R278 Other lack of coordination: Secondary | ICD-10-CM | POA: Diagnosis not present

## 2020-02-07 DIAGNOSIS — M5136 Other intervertebral disc degeneration, lumbar region: Secondary | ICD-10-CM | POA: Diagnosis not present

## 2020-02-07 DIAGNOSIS — R2681 Unsteadiness on feet: Secondary | ICD-10-CM | POA: Diagnosis not present

## 2020-02-08 DIAGNOSIS — I639 Cerebral infarction, unspecified: Secondary | ICD-10-CM | POA: Diagnosis not present

## 2020-02-08 DIAGNOSIS — A419 Sepsis, unspecified organism: Secondary | ICD-10-CM | POA: Diagnosis not present

## 2020-02-08 DIAGNOSIS — M24561 Contracture, right knee: Secondary | ICD-10-CM | POA: Diagnosis not present

## 2020-02-08 DIAGNOSIS — R0602 Shortness of breath: Secondary | ICD-10-CM | POA: Diagnosis not present

## 2020-02-08 DIAGNOSIS — L89890 Pressure ulcer of other site, unstageable: Secondary | ICD-10-CM | POA: Diagnosis not present

## 2020-02-08 DIAGNOSIS — M5030 Other cervical disc degeneration, unspecified cervical region: Secondary | ICD-10-CM | POA: Diagnosis not present

## 2020-02-08 DIAGNOSIS — M5136 Other intervertebral disc degeneration, lumbar region: Secondary | ICD-10-CM | POA: Diagnosis not present

## 2020-02-08 DIAGNOSIS — R2681 Unsteadiness on feet: Secondary | ICD-10-CM | POA: Diagnosis not present

## 2020-02-08 DIAGNOSIS — R278 Other lack of coordination: Secondary | ICD-10-CM | POA: Diagnosis not present

## 2020-02-08 DIAGNOSIS — M24562 Contracture, left knee: Secondary | ICD-10-CM | POA: Diagnosis not present

## 2020-02-09 DIAGNOSIS — L89890 Pressure ulcer of other site, unstageable: Secondary | ICD-10-CM | POA: Diagnosis not present

## 2020-02-09 DIAGNOSIS — M5136 Other intervertebral disc degeneration, lumbar region: Secondary | ICD-10-CM | POA: Diagnosis not present

## 2020-02-09 DIAGNOSIS — R0602 Shortness of breath: Secondary | ICD-10-CM | POA: Diagnosis not present

## 2020-02-09 DIAGNOSIS — M24562 Contracture, left knee: Secondary | ICD-10-CM | POA: Diagnosis not present

## 2020-02-09 DIAGNOSIS — M24561 Contracture, right knee: Secondary | ICD-10-CM | POA: Diagnosis not present

## 2020-02-09 DIAGNOSIS — R278 Other lack of coordination: Secondary | ICD-10-CM | POA: Diagnosis not present

## 2020-02-09 DIAGNOSIS — M5030 Other cervical disc degeneration, unspecified cervical region: Secondary | ICD-10-CM | POA: Diagnosis not present

## 2020-02-09 DIAGNOSIS — A419 Sepsis, unspecified organism: Secondary | ICD-10-CM | POA: Diagnosis not present

## 2020-02-09 DIAGNOSIS — R2681 Unsteadiness on feet: Secondary | ICD-10-CM | POA: Diagnosis not present

## 2020-02-09 DIAGNOSIS — I639 Cerebral infarction, unspecified: Secondary | ICD-10-CM | POA: Diagnosis not present

## 2020-02-10 DIAGNOSIS — M5136 Other intervertebral disc degeneration, lumbar region: Secondary | ICD-10-CM | POA: Diagnosis not present

## 2020-02-10 DIAGNOSIS — R2681 Unsteadiness on feet: Secondary | ICD-10-CM | POA: Diagnosis not present

## 2020-02-10 DIAGNOSIS — M24562 Contracture, left knee: Secondary | ICD-10-CM | POA: Diagnosis not present

## 2020-02-10 DIAGNOSIS — M5030 Other cervical disc degeneration, unspecified cervical region: Secondary | ICD-10-CM | POA: Diagnosis not present

## 2020-02-10 DIAGNOSIS — I639 Cerebral infarction, unspecified: Secondary | ICD-10-CM | POA: Diagnosis not present

## 2020-02-10 DIAGNOSIS — M24561 Contracture, right knee: Secondary | ICD-10-CM | POA: Diagnosis not present

## 2020-02-10 DIAGNOSIS — L89890 Pressure ulcer of other site, unstageable: Secondary | ICD-10-CM | POA: Diagnosis not present

## 2020-02-10 DIAGNOSIS — R278 Other lack of coordination: Secondary | ICD-10-CM | POA: Diagnosis not present

## 2020-02-10 DIAGNOSIS — A419 Sepsis, unspecified organism: Secondary | ICD-10-CM | POA: Diagnosis not present

## 2020-02-10 DIAGNOSIS — R0602 Shortness of breath: Secondary | ICD-10-CM | POA: Diagnosis not present

## 2020-02-11 DIAGNOSIS — M5030 Other cervical disc degeneration, unspecified cervical region: Secondary | ICD-10-CM | POA: Diagnosis not present

## 2020-02-11 DIAGNOSIS — M5136 Other intervertebral disc degeneration, lumbar region: Secondary | ICD-10-CM | POA: Diagnosis not present

## 2020-02-11 DIAGNOSIS — R2681 Unsteadiness on feet: Secondary | ICD-10-CM | POA: Diagnosis not present

## 2020-02-11 DIAGNOSIS — I639 Cerebral infarction, unspecified: Secondary | ICD-10-CM | POA: Diagnosis not present

## 2020-02-11 DIAGNOSIS — A419 Sepsis, unspecified organism: Secondary | ICD-10-CM | POA: Diagnosis not present

## 2020-02-11 DIAGNOSIS — M24562 Contracture, left knee: Secondary | ICD-10-CM | POA: Diagnosis not present

## 2020-02-11 DIAGNOSIS — L89890 Pressure ulcer of other site, unstageable: Secondary | ICD-10-CM | POA: Diagnosis not present

## 2020-02-11 DIAGNOSIS — R278 Other lack of coordination: Secondary | ICD-10-CM | POA: Diagnosis not present

## 2020-02-11 DIAGNOSIS — R0602 Shortness of breath: Secondary | ICD-10-CM | POA: Diagnosis not present

## 2020-02-11 DIAGNOSIS — M24561 Contracture, right knee: Secondary | ICD-10-CM | POA: Diagnosis not present

## 2020-02-12 DIAGNOSIS — M5136 Other intervertebral disc degeneration, lumbar region: Secondary | ICD-10-CM | POA: Diagnosis not present

## 2020-02-12 DIAGNOSIS — L89890 Pressure ulcer of other site, unstageable: Secondary | ICD-10-CM | POA: Diagnosis not present

## 2020-02-12 DIAGNOSIS — M5030 Other cervical disc degeneration, unspecified cervical region: Secondary | ICD-10-CM | POA: Diagnosis not present

## 2020-02-12 DIAGNOSIS — R278 Other lack of coordination: Secondary | ICD-10-CM | POA: Diagnosis not present

## 2020-02-12 DIAGNOSIS — A419 Sepsis, unspecified organism: Secondary | ICD-10-CM | POA: Diagnosis not present

## 2020-02-12 DIAGNOSIS — R0602 Shortness of breath: Secondary | ICD-10-CM | POA: Diagnosis not present

## 2020-02-12 DIAGNOSIS — I639 Cerebral infarction, unspecified: Secondary | ICD-10-CM | POA: Diagnosis not present

## 2020-02-12 DIAGNOSIS — M24562 Contracture, left knee: Secondary | ICD-10-CM | POA: Diagnosis not present

## 2020-02-12 DIAGNOSIS — R2681 Unsteadiness on feet: Secondary | ICD-10-CM | POA: Diagnosis not present

## 2020-02-12 DIAGNOSIS — M24561 Contracture, right knee: Secondary | ICD-10-CM | POA: Diagnosis not present

## 2020-02-13 DIAGNOSIS — I639 Cerebral infarction, unspecified: Secondary | ICD-10-CM | POA: Diagnosis not present

## 2020-02-13 DIAGNOSIS — L89890 Pressure ulcer of other site, unstageable: Secondary | ICD-10-CM | POA: Diagnosis not present

## 2020-02-13 DIAGNOSIS — M5030 Other cervical disc degeneration, unspecified cervical region: Secondary | ICD-10-CM | POA: Diagnosis not present

## 2020-02-13 DIAGNOSIS — M24562 Contracture, left knee: Secondary | ICD-10-CM | POA: Diagnosis not present

## 2020-02-13 DIAGNOSIS — A419 Sepsis, unspecified organism: Secondary | ICD-10-CM | POA: Diagnosis not present

## 2020-02-13 DIAGNOSIS — R0602 Shortness of breath: Secondary | ICD-10-CM | POA: Diagnosis not present

## 2020-02-13 DIAGNOSIS — R278 Other lack of coordination: Secondary | ICD-10-CM | POA: Diagnosis not present

## 2020-02-13 DIAGNOSIS — M24561 Contracture, right knee: Secondary | ICD-10-CM | POA: Diagnosis not present

## 2020-02-13 DIAGNOSIS — R2681 Unsteadiness on feet: Secondary | ICD-10-CM | POA: Diagnosis not present

## 2020-02-13 DIAGNOSIS — M5136 Other intervertebral disc degeneration, lumbar region: Secondary | ICD-10-CM | POA: Diagnosis not present

## 2020-02-14 DIAGNOSIS — I639 Cerebral infarction, unspecified: Secondary | ICD-10-CM | POA: Diagnosis not present

## 2020-02-14 DIAGNOSIS — M5136 Other intervertebral disc degeneration, lumbar region: Secondary | ICD-10-CM | POA: Diagnosis not present

## 2020-02-14 DIAGNOSIS — R2681 Unsteadiness on feet: Secondary | ICD-10-CM | POA: Diagnosis not present

## 2020-02-14 DIAGNOSIS — M24561 Contracture, right knee: Secondary | ICD-10-CM | POA: Diagnosis not present

## 2020-02-14 DIAGNOSIS — M5030 Other cervical disc degeneration, unspecified cervical region: Secondary | ICD-10-CM | POA: Diagnosis not present

## 2020-02-14 DIAGNOSIS — L89890 Pressure ulcer of other site, unstageable: Secondary | ICD-10-CM | POA: Diagnosis not present

## 2020-02-14 DIAGNOSIS — A419 Sepsis, unspecified organism: Secondary | ICD-10-CM | POA: Diagnosis not present

## 2020-02-14 DIAGNOSIS — R278 Other lack of coordination: Secondary | ICD-10-CM | POA: Diagnosis not present

## 2020-02-14 DIAGNOSIS — M24562 Contracture, left knee: Secondary | ICD-10-CM | POA: Diagnosis not present

## 2020-02-14 DIAGNOSIS — R0602 Shortness of breath: Secondary | ICD-10-CM | POA: Diagnosis not present

## 2020-02-14 DIAGNOSIS — Z03818 Encounter for observation for suspected exposure to other biological agents ruled out: Secondary | ICD-10-CM | POA: Diagnosis not present

## 2020-02-17 DIAGNOSIS — R2681 Unsteadiness on feet: Secondary | ICD-10-CM | POA: Diagnosis not present

## 2020-02-17 DIAGNOSIS — R278 Other lack of coordination: Secondary | ICD-10-CM | POA: Diagnosis not present

## 2020-02-17 DIAGNOSIS — M5030 Other cervical disc degeneration, unspecified cervical region: Secondary | ICD-10-CM | POA: Diagnosis not present

## 2020-02-17 DIAGNOSIS — M24562 Contracture, left knee: Secondary | ICD-10-CM | POA: Diagnosis not present

## 2020-02-17 DIAGNOSIS — A419 Sepsis, unspecified organism: Secondary | ICD-10-CM | POA: Diagnosis not present

## 2020-02-17 DIAGNOSIS — I639 Cerebral infarction, unspecified: Secondary | ICD-10-CM | POA: Diagnosis not present

## 2020-02-17 DIAGNOSIS — L89890 Pressure ulcer of other site, unstageable: Secondary | ICD-10-CM | POA: Diagnosis not present

## 2020-02-17 DIAGNOSIS — M24561 Contracture, right knee: Secondary | ICD-10-CM | POA: Diagnosis not present

## 2020-02-17 DIAGNOSIS — R0602 Shortness of breath: Secondary | ICD-10-CM | POA: Diagnosis not present

## 2020-02-17 DIAGNOSIS — M5136 Other intervertebral disc degeneration, lumbar region: Secondary | ICD-10-CM | POA: Diagnosis not present

## 2020-02-18 DIAGNOSIS — I82891 Chronic embolism and thrombosis of other specified veins: Secondary | ICD-10-CM | POA: Diagnosis not present

## 2020-02-18 DIAGNOSIS — I679 Cerebrovascular disease, unspecified: Secondary | ICD-10-CM | POA: Diagnosis not present

## 2020-02-18 DIAGNOSIS — M79604 Pain in right leg: Secondary | ICD-10-CM | POA: Diagnosis not present

## 2020-02-18 DIAGNOSIS — Z86718 Personal history of other venous thrombosis and embolism: Secondary | ICD-10-CM | POA: Diagnosis not present

## 2020-02-18 DIAGNOSIS — D869 Sarcoidosis, unspecified: Secondary | ICD-10-CM | POA: Diagnosis not present

## 2020-02-18 DIAGNOSIS — M50322 Other cervical disc degeneration at C5-C6 level: Secondary | ICD-10-CM | POA: Diagnosis not present

## 2020-02-18 DIAGNOSIS — R202 Paresthesia of skin: Secondary | ICD-10-CM | POA: Diagnosis not present

## 2020-02-18 DIAGNOSIS — M48061 Spinal stenosis, lumbar region without neurogenic claudication: Secondary | ICD-10-CM | POA: Diagnosis not present

## 2020-02-18 DIAGNOSIS — R413 Other amnesia: Secondary | ICD-10-CM | POA: Diagnosis not present

## 2020-02-18 DIAGNOSIS — H539 Unspecified visual disturbance: Secondary | ICD-10-CM | POA: Diagnosis not present

## 2020-02-18 DIAGNOSIS — Z91013 Allergy to seafood: Secondary | ICD-10-CM | POA: Diagnosis not present

## 2020-02-18 DIAGNOSIS — I69354 Hemiplegia and hemiparesis following cerebral infarction affecting left non-dominant side: Secondary | ICD-10-CM | POA: Diagnosis not present

## 2020-02-18 DIAGNOSIS — R4781 Slurred speech: Secondary | ICD-10-CM | POA: Diagnosis not present

## 2020-02-18 DIAGNOSIS — M24562 Contracture, left knee: Secondary | ICD-10-CM | POA: Diagnosis not present

## 2020-02-18 DIAGNOSIS — M5136 Other intervertebral disc degeneration, lumbar region: Secondary | ICD-10-CM | POA: Diagnosis not present

## 2020-02-18 DIAGNOSIS — R6889 Other general symptoms and signs: Secondary | ICD-10-CM | POA: Diagnosis not present

## 2020-02-18 DIAGNOSIS — I10 Essential (primary) hypertension: Secondary | ICD-10-CM | POA: Diagnosis not present

## 2020-02-18 DIAGNOSIS — E785 Hyperlipidemia, unspecified: Secondary | ICD-10-CM | POA: Diagnosis not present

## 2020-02-18 DIAGNOSIS — Z79899 Other long term (current) drug therapy: Secondary | ICD-10-CM | POA: Diagnosis not present

## 2020-02-18 DIAGNOSIS — G959 Disease of spinal cord, unspecified: Secondary | ICD-10-CM | POA: Diagnosis not present

## 2020-02-18 DIAGNOSIS — M4802 Spinal stenosis, cervical region: Secondary | ICD-10-CM | POA: Diagnosis not present

## 2020-02-18 DIAGNOSIS — I69920 Aphasia following unspecified cerebrovascular disease: Secondary | ICD-10-CM | POA: Diagnosis not present

## 2020-02-18 DIAGNOSIS — Z882 Allergy status to sulfonamides status: Secondary | ICD-10-CM | POA: Diagnosis not present

## 2020-02-18 DIAGNOSIS — R9431 Abnormal electrocardiogram [ECG] [EKG]: Secondary | ICD-10-CM | POA: Diagnosis not present

## 2020-02-18 DIAGNOSIS — R200 Anesthesia of skin: Secondary | ICD-10-CM | POA: Diagnosis not present

## 2020-02-18 DIAGNOSIS — E230 Hypopituitarism: Secondary | ICD-10-CM | POA: Diagnosis not present

## 2020-02-18 DIAGNOSIS — I639 Cerebral infarction, unspecified: Secondary | ICD-10-CM | POA: Diagnosis not present

## 2020-02-18 DIAGNOSIS — F32A Depression, unspecified: Secondary | ICD-10-CM | POA: Diagnosis not present

## 2020-02-18 DIAGNOSIS — R519 Headache, unspecified: Secondary | ICD-10-CM | POA: Diagnosis not present

## 2020-02-18 DIAGNOSIS — M4805 Spinal stenosis, thoracolumbar region: Secondary | ICD-10-CM | POA: Diagnosis not present

## 2020-02-18 DIAGNOSIS — Z886 Allergy status to analgesic agent status: Secondary | ICD-10-CM | POA: Diagnosis not present

## 2020-02-18 DIAGNOSIS — Z8673 Personal history of transient ischemic attack (TIA), and cerebral infarction without residual deficits: Secondary | ICD-10-CM | POA: Diagnosis not present

## 2020-02-18 DIAGNOSIS — I69351 Hemiplegia and hemiparesis following cerebral infarction affecting right dominant side: Secondary | ICD-10-CM | POA: Diagnosis not present

## 2020-02-18 DIAGNOSIS — M5030 Other cervical disc degeneration, unspecified cervical region: Secondary | ICD-10-CM | POA: Diagnosis not present

## 2020-02-18 DIAGNOSIS — R29898 Other symptoms and signs involving the musculoskeletal system: Secondary | ICD-10-CM | POA: Diagnosis not present

## 2020-02-18 DIAGNOSIS — Z7901 Long term (current) use of anticoagulants: Secondary | ICD-10-CM | POA: Diagnosis not present

## 2020-02-18 DIAGNOSIS — M5416 Radiculopathy, lumbar region: Secondary | ICD-10-CM | POA: Diagnosis not present

## 2020-02-19 DIAGNOSIS — I639 Cerebral infarction, unspecified: Secondary | ICD-10-CM | POA: Diagnosis not present

## 2020-02-19 DIAGNOSIS — A419 Sepsis, unspecified organism: Secondary | ICD-10-CM | POA: Diagnosis not present

## 2020-02-19 DIAGNOSIS — L89890 Pressure ulcer of other site, unstageable: Secondary | ICD-10-CM | POA: Diagnosis not present

## 2020-02-19 DIAGNOSIS — R2681 Unsteadiness on feet: Secondary | ICD-10-CM | POA: Diagnosis not present

## 2020-02-19 DIAGNOSIS — M24561 Contracture, right knee: Secondary | ICD-10-CM | POA: Diagnosis not present

## 2020-02-19 DIAGNOSIS — R0602 Shortness of breath: Secondary | ICD-10-CM | POA: Diagnosis not present

## 2020-02-19 DIAGNOSIS — R6889 Other general symptoms and signs: Secondary | ICD-10-CM | POA: Diagnosis not present

## 2020-02-19 DIAGNOSIS — M24562 Contracture, left knee: Secondary | ICD-10-CM | POA: Diagnosis not present

## 2020-02-19 DIAGNOSIS — M5136 Other intervertebral disc degeneration, lumbar region: Secondary | ICD-10-CM | POA: Diagnosis not present

## 2020-02-19 DIAGNOSIS — R278 Other lack of coordination: Secondary | ICD-10-CM | POA: Diagnosis not present

## 2020-02-19 DIAGNOSIS — M5030 Other cervical disc degeneration, unspecified cervical region: Secondary | ICD-10-CM | POA: Diagnosis not present

## 2020-02-21 DIAGNOSIS — M5136 Other intervertebral disc degeneration, lumbar region: Secondary | ICD-10-CM | POA: Diagnosis not present

## 2020-02-21 DIAGNOSIS — M24561 Contracture, right knee: Secondary | ICD-10-CM | POA: Diagnosis not present

## 2020-02-21 DIAGNOSIS — I639 Cerebral infarction, unspecified: Secondary | ICD-10-CM | POA: Diagnosis not present

## 2020-02-21 DIAGNOSIS — R278 Other lack of coordination: Secondary | ICD-10-CM | POA: Diagnosis not present

## 2020-02-21 DIAGNOSIS — A419 Sepsis, unspecified organism: Secondary | ICD-10-CM | POA: Diagnosis not present

## 2020-02-21 DIAGNOSIS — L89890 Pressure ulcer of other site, unstageable: Secondary | ICD-10-CM | POA: Diagnosis not present

## 2020-02-21 DIAGNOSIS — M5030 Other cervical disc degeneration, unspecified cervical region: Secondary | ICD-10-CM | POA: Diagnosis not present

## 2020-02-21 DIAGNOSIS — Z139 Encounter for screening, unspecified: Secondary | ICD-10-CM | POA: Diagnosis not present

## 2020-02-21 DIAGNOSIS — Z0000 Encounter for general adult medical examination without abnormal findings: Secondary | ICD-10-CM | POA: Diagnosis not present

## 2020-02-21 DIAGNOSIS — R0602 Shortness of breath: Secondary | ICD-10-CM | POA: Diagnosis not present

## 2020-02-21 DIAGNOSIS — Z1389 Encounter for screening for other disorder: Secondary | ICD-10-CM | POA: Diagnosis not present

## 2020-02-21 DIAGNOSIS — R2681 Unsteadiness on feet: Secondary | ICD-10-CM | POA: Diagnosis not present

## 2020-02-21 DIAGNOSIS — M24562 Contracture, left knee: Secondary | ICD-10-CM | POA: Diagnosis not present

## 2020-02-23 DIAGNOSIS — M5136 Other intervertebral disc degeneration, lumbar region: Secondary | ICD-10-CM | POA: Diagnosis not present

## 2020-02-23 DIAGNOSIS — R0602 Shortness of breath: Secondary | ICD-10-CM | POA: Diagnosis not present

## 2020-02-23 DIAGNOSIS — M24562 Contracture, left knee: Secondary | ICD-10-CM | POA: Diagnosis not present

## 2020-02-23 DIAGNOSIS — M24561 Contracture, right knee: Secondary | ICD-10-CM | POA: Diagnosis not present

## 2020-02-23 DIAGNOSIS — R278 Other lack of coordination: Secondary | ICD-10-CM | POA: Diagnosis not present

## 2020-02-23 DIAGNOSIS — I639 Cerebral infarction, unspecified: Secondary | ICD-10-CM | POA: Diagnosis not present

## 2020-02-23 DIAGNOSIS — M5030 Other cervical disc degeneration, unspecified cervical region: Secondary | ICD-10-CM | POA: Diagnosis not present

## 2020-02-23 DIAGNOSIS — L89890 Pressure ulcer of other site, unstageable: Secondary | ICD-10-CM | POA: Diagnosis not present

## 2020-02-23 DIAGNOSIS — A419 Sepsis, unspecified organism: Secondary | ICD-10-CM | POA: Diagnosis not present

## 2020-02-23 DIAGNOSIS — R2681 Unsteadiness on feet: Secondary | ICD-10-CM | POA: Diagnosis not present

## 2020-02-24 DIAGNOSIS — R0602 Shortness of breath: Secondary | ICD-10-CM | POA: Diagnosis not present

## 2020-02-24 DIAGNOSIS — M24562 Contracture, left knee: Secondary | ICD-10-CM | POA: Diagnosis not present

## 2020-02-24 DIAGNOSIS — M5136 Other intervertebral disc degeneration, lumbar region: Secondary | ICD-10-CM | POA: Diagnosis not present

## 2020-02-24 DIAGNOSIS — R278 Other lack of coordination: Secondary | ICD-10-CM | POA: Diagnosis not present

## 2020-02-24 DIAGNOSIS — I639 Cerebral infarction, unspecified: Secondary | ICD-10-CM | POA: Diagnosis not present

## 2020-02-24 DIAGNOSIS — A419 Sepsis, unspecified organism: Secondary | ICD-10-CM | POA: Diagnosis not present

## 2020-02-24 DIAGNOSIS — L89890 Pressure ulcer of other site, unstageable: Secondary | ICD-10-CM | POA: Diagnosis not present

## 2020-02-24 DIAGNOSIS — R2681 Unsteadiness on feet: Secondary | ICD-10-CM | POA: Diagnosis not present

## 2020-02-24 DIAGNOSIS — M24561 Contracture, right knee: Secondary | ICD-10-CM | POA: Diagnosis not present

## 2020-02-24 DIAGNOSIS — M5030 Other cervical disc degeneration, unspecified cervical region: Secondary | ICD-10-CM | POA: Diagnosis not present

## 2020-02-25 DIAGNOSIS — M5030 Other cervical disc degeneration, unspecified cervical region: Secondary | ICD-10-CM | POA: Diagnosis not present

## 2020-02-25 DIAGNOSIS — A419 Sepsis, unspecified organism: Secondary | ICD-10-CM | POA: Diagnosis not present

## 2020-02-25 DIAGNOSIS — M5136 Other intervertebral disc degeneration, lumbar region: Secondary | ICD-10-CM | POA: Diagnosis not present

## 2020-02-25 DIAGNOSIS — R2681 Unsteadiness on feet: Secondary | ICD-10-CM | POA: Diagnosis not present

## 2020-02-25 DIAGNOSIS — M24561 Contracture, right knee: Secondary | ICD-10-CM | POA: Diagnosis not present

## 2020-02-25 DIAGNOSIS — I639 Cerebral infarction, unspecified: Secondary | ICD-10-CM | POA: Diagnosis not present

## 2020-02-25 DIAGNOSIS — R278 Other lack of coordination: Secondary | ICD-10-CM | POA: Diagnosis not present

## 2020-02-25 DIAGNOSIS — M24562 Contracture, left knee: Secondary | ICD-10-CM | POA: Diagnosis not present

## 2020-02-25 DIAGNOSIS — R0602 Shortness of breath: Secondary | ICD-10-CM | POA: Diagnosis not present

## 2020-02-25 DIAGNOSIS — L89890 Pressure ulcer of other site, unstageable: Secondary | ICD-10-CM | POA: Diagnosis not present

## 2020-02-26 DIAGNOSIS — M24561 Contracture, right knee: Secondary | ICD-10-CM | POA: Diagnosis not present

## 2020-02-26 DIAGNOSIS — M24562 Contracture, left knee: Secondary | ICD-10-CM | POA: Diagnosis not present

## 2020-02-26 DIAGNOSIS — R0602 Shortness of breath: Secondary | ICD-10-CM | POA: Diagnosis not present

## 2020-02-26 DIAGNOSIS — L89890 Pressure ulcer of other site, unstageable: Secondary | ICD-10-CM | POA: Diagnosis not present

## 2020-02-26 DIAGNOSIS — I639 Cerebral infarction, unspecified: Secondary | ICD-10-CM | POA: Diagnosis not present

## 2020-02-26 DIAGNOSIS — M5030 Other cervical disc degeneration, unspecified cervical region: Secondary | ICD-10-CM | POA: Diagnosis not present

## 2020-02-26 DIAGNOSIS — M5136 Other intervertebral disc degeneration, lumbar region: Secondary | ICD-10-CM | POA: Diagnosis not present

## 2020-02-26 DIAGNOSIS — A419 Sepsis, unspecified organism: Secondary | ICD-10-CM | POA: Diagnosis not present

## 2020-02-26 DIAGNOSIS — R2681 Unsteadiness on feet: Secondary | ICD-10-CM | POA: Diagnosis not present

## 2020-02-26 DIAGNOSIS — R278 Other lack of coordination: Secondary | ICD-10-CM | POA: Diagnosis not present

## 2020-02-28 DIAGNOSIS — A419 Sepsis, unspecified organism: Secondary | ICD-10-CM | POA: Diagnosis not present

## 2020-02-28 DIAGNOSIS — I639 Cerebral infarction, unspecified: Secondary | ICD-10-CM | POA: Diagnosis not present

## 2020-02-28 DIAGNOSIS — M5136 Other intervertebral disc degeneration, lumbar region: Secondary | ICD-10-CM | POA: Diagnosis not present

## 2020-02-28 DIAGNOSIS — R2681 Unsteadiness on feet: Secondary | ICD-10-CM | POA: Diagnosis not present

## 2020-02-28 DIAGNOSIS — L89890 Pressure ulcer of other site, unstageable: Secondary | ICD-10-CM | POA: Diagnosis not present

## 2020-02-28 DIAGNOSIS — M5030 Other cervical disc degeneration, unspecified cervical region: Secondary | ICD-10-CM | POA: Diagnosis not present

## 2020-02-28 DIAGNOSIS — R278 Other lack of coordination: Secondary | ICD-10-CM | POA: Diagnosis not present

## 2020-02-28 DIAGNOSIS — M24561 Contracture, right knee: Secondary | ICD-10-CM | POA: Diagnosis not present

## 2020-02-28 DIAGNOSIS — R0602 Shortness of breath: Secondary | ICD-10-CM | POA: Diagnosis not present

## 2020-02-28 DIAGNOSIS — M24562 Contracture, left knee: Secondary | ICD-10-CM | POA: Diagnosis not present

## 2020-02-29 DIAGNOSIS — M24561 Contracture, right knee: Secondary | ICD-10-CM | POA: Diagnosis not present

## 2020-02-29 DIAGNOSIS — L89890 Pressure ulcer of other site, unstageable: Secondary | ICD-10-CM | POA: Diagnosis not present

## 2020-02-29 DIAGNOSIS — R278 Other lack of coordination: Secondary | ICD-10-CM | POA: Diagnosis not present

## 2020-02-29 DIAGNOSIS — R2681 Unsteadiness on feet: Secondary | ICD-10-CM | POA: Diagnosis not present

## 2020-02-29 DIAGNOSIS — A419 Sepsis, unspecified organism: Secondary | ICD-10-CM | POA: Diagnosis not present

## 2020-02-29 DIAGNOSIS — I639 Cerebral infarction, unspecified: Secondary | ICD-10-CM | POA: Diagnosis not present

## 2020-02-29 DIAGNOSIS — R0602 Shortness of breath: Secondary | ICD-10-CM | POA: Diagnosis not present

## 2020-02-29 DIAGNOSIS — M5136 Other intervertebral disc degeneration, lumbar region: Secondary | ICD-10-CM | POA: Diagnosis not present

## 2020-02-29 DIAGNOSIS — M5030 Other cervical disc degeneration, unspecified cervical region: Secondary | ICD-10-CM | POA: Diagnosis not present

## 2020-02-29 DIAGNOSIS — M24562 Contracture, left knee: Secondary | ICD-10-CM | POA: Diagnosis not present

## 2020-03-01 DIAGNOSIS — M24561 Contracture, right knee: Secondary | ICD-10-CM | POA: Diagnosis not present

## 2020-03-01 DIAGNOSIS — M5030 Other cervical disc degeneration, unspecified cervical region: Secondary | ICD-10-CM | POA: Diagnosis not present

## 2020-03-01 DIAGNOSIS — R278 Other lack of coordination: Secondary | ICD-10-CM | POA: Diagnosis not present

## 2020-03-01 DIAGNOSIS — I639 Cerebral infarction, unspecified: Secondary | ICD-10-CM | POA: Diagnosis not present

## 2020-03-01 DIAGNOSIS — R2681 Unsteadiness on feet: Secondary | ICD-10-CM | POA: Diagnosis not present

## 2020-03-01 DIAGNOSIS — L89890 Pressure ulcer of other site, unstageable: Secondary | ICD-10-CM | POA: Diagnosis not present

## 2020-03-01 DIAGNOSIS — A419 Sepsis, unspecified organism: Secondary | ICD-10-CM | POA: Diagnosis not present

## 2020-03-01 DIAGNOSIS — M5136 Other intervertebral disc degeneration, lumbar region: Secondary | ICD-10-CM | POA: Diagnosis not present

## 2020-03-01 DIAGNOSIS — M24562 Contracture, left knee: Secondary | ICD-10-CM | POA: Diagnosis not present

## 2020-03-01 DIAGNOSIS — R0602 Shortness of breath: Secondary | ICD-10-CM | POA: Diagnosis not present

## 2020-03-02 DIAGNOSIS — M5136 Other intervertebral disc degeneration, lumbar region: Secondary | ICD-10-CM | POA: Diagnosis not present

## 2020-03-02 DIAGNOSIS — L89890 Pressure ulcer of other site, unstageable: Secondary | ICD-10-CM | POA: Diagnosis not present

## 2020-03-02 DIAGNOSIS — R0602 Shortness of breath: Secondary | ICD-10-CM | POA: Diagnosis not present

## 2020-03-02 DIAGNOSIS — A419 Sepsis, unspecified organism: Secondary | ICD-10-CM | POA: Diagnosis not present

## 2020-03-02 DIAGNOSIS — R2681 Unsteadiness on feet: Secondary | ICD-10-CM | POA: Diagnosis not present

## 2020-03-02 DIAGNOSIS — R278 Other lack of coordination: Secondary | ICD-10-CM | POA: Diagnosis not present

## 2020-03-02 DIAGNOSIS — M5030 Other cervical disc degeneration, unspecified cervical region: Secondary | ICD-10-CM | POA: Diagnosis not present

## 2020-03-02 DIAGNOSIS — I639 Cerebral infarction, unspecified: Secondary | ICD-10-CM | POA: Diagnosis not present

## 2020-03-02 DIAGNOSIS — M24562 Contracture, left knee: Secondary | ICD-10-CM | POA: Diagnosis not present

## 2020-03-02 DIAGNOSIS — M24561 Contracture, right knee: Secondary | ICD-10-CM | POA: Diagnosis not present

## 2020-03-03 DIAGNOSIS — R2681 Unsteadiness on feet: Secondary | ICD-10-CM | POA: Diagnosis not present

## 2020-03-03 DIAGNOSIS — I639 Cerebral infarction, unspecified: Secondary | ICD-10-CM | POA: Diagnosis not present

## 2020-03-03 DIAGNOSIS — L89890 Pressure ulcer of other site, unstageable: Secondary | ICD-10-CM | POA: Diagnosis not present

## 2020-03-03 DIAGNOSIS — M24561 Contracture, right knee: Secondary | ICD-10-CM | POA: Diagnosis not present

## 2020-03-03 DIAGNOSIS — A419 Sepsis, unspecified organism: Secondary | ICD-10-CM | POA: Diagnosis not present

## 2020-03-03 DIAGNOSIS — M5030 Other cervical disc degeneration, unspecified cervical region: Secondary | ICD-10-CM | POA: Diagnosis not present

## 2020-03-03 DIAGNOSIS — R278 Other lack of coordination: Secondary | ICD-10-CM | POA: Diagnosis not present

## 2020-03-03 DIAGNOSIS — M5136 Other intervertebral disc degeneration, lumbar region: Secondary | ICD-10-CM | POA: Diagnosis not present

## 2020-03-03 DIAGNOSIS — R0602 Shortness of breath: Secondary | ICD-10-CM | POA: Diagnosis not present

## 2020-03-03 DIAGNOSIS — M24562 Contracture, left knee: Secondary | ICD-10-CM | POA: Diagnosis not present

## 2020-03-04 DIAGNOSIS — M5136 Other intervertebral disc degeneration, lumbar region: Secondary | ICD-10-CM | POA: Diagnosis not present

## 2020-03-04 DIAGNOSIS — M5030 Other cervical disc degeneration, unspecified cervical region: Secondary | ICD-10-CM | POA: Diagnosis not present

## 2020-03-04 DIAGNOSIS — R0602 Shortness of breath: Secondary | ICD-10-CM | POA: Diagnosis not present

## 2020-03-04 DIAGNOSIS — I639 Cerebral infarction, unspecified: Secondary | ICD-10-CM | POA: Diagnosis not present

## 2020-03-04 DIAGNOSIS — R278 Other lack of coordination: Secondary | ICD-10-CM | POA: Diagnosis not present

## 2020-03-04 DIAGNOSIS — A419 Sepsis, unspecified organism: Secondary | ICD-10-CM | POA: Diagnosis not present

## 2020-03-04 DIAGNOSIS — R2681 Unsteadiness on feet: Secondary | ICD-10-CM | POA: Diagnosis not present

## 2020-03-04 DIAGNOSIS — M24561 Contracture, right knee: Secondary | ICD-10-CM | POA: Diagnosis not present

## 2020-03-04 DIAGNOSIS — L89890 Pressure ulcer of other site, unstageable: Secondary | ICD-10-CM | POA: Diagnosis not present

## 2020-03-04 DIAGNOSIS — M24562 Contracture, left knee: Secondary | ICD-10-CM | POA: Diagnosis not present

## 2020-03-05 DIAGNOSIS — R0602 Shortness of breath: Secondary | ICD-10-CM | POA: Diagnosis not present

## 2020-03-05 DIAGNOSIS — M24562 Contracture, left knee: Secondary | ICD-10-CM | POA: Diagnosis not present

## 2020-03-05 DIAGNOSIS — M24561 Contracture, right knee: Secondary | ICD-10-CM | POA: Diagnosis not present

## 2020-03-05 DIAGNOSIS — L89890 Pressure ulcer of other site, unstageable: Secondary | ICD-10-CM | POA: Diagnosis not present

## 2020-03-05 DIAGNOSIS — A419 Sepsis, unspecified organism: Secondary | ICD-10-CM | POA: Diagnosis not present

## 2020-03-05 DIAGNOSIS — I639 Cerebral infarction, unspecified: Secondary | ICD-10-CM | POA: Diagnosis not present

## 2020-03-05 DIAGNOSIS — R2681 Unsteadiness on feet: Secondary | ICD-10-CM | POA: Diagnosis not present

## 2020-03-05 DIAGNOSIS — M5030 Other cervical disc degeneration, unspecified cervical region: Secondary | ICD-10-CM | POA: Diagnosis not present

## 2020-03-05 DIAGNOSIS — M5136 Other intervertebral disc degeneration, lumbar region: Secondary | ICD-10-CM | POA: Diagnosis not present

## 2020-03-05 DIAGNOSIS — R278 Other lack of coordination: Secondary | ICD-10-CM | POA: Diagnosis not present

## 2020-03-06 DIAGNOSIS — R0602 Shortness of breath: Secondary | ICD-10-CM | POA: Diagnosis not present

## 2020-03-06 DIAGNOSIS — A419 Sepsis, unspecified organism: Secondary | ICD-10-CM | POA: Diagnosis not present

## 2020-03-06 DIAGNOSIS — R2681 Unsteadiness on feet: Secondary | ICD-10-CM | POA: Diagnosis not present

## 2020-03-06 DIAGNOSIS — M5030 Other cervical disc degeneration, unspecified cervical region: Secondary | ICD-10-CM | POA: Diagnosis not present

## 2020-03-06 DIAGNOSIS — L89890 Pressure ulcer of other site, unstageable: Secondary | ICD-10-CM | POA: Diagnosis not present

## 2020-03-06 DIAGNOSIS — R278 Other lack of coordination: Secondary | ICD-10-CM | POA: Diagnosis not present

## 2020-03-06 DIAGNOSIS — M5136 Other intervertebral disc degeneration, lumbar region: Secondary | ICD-10-CM | POA: Diagnosis not present

## 2020-03-06 DIAGNOSIS — I639 Cerebral infarction, unspecified: Secondary | ICD-10-CM | POA: Diagnosis not present

## 2020-03-06 DIAGNOSIS — M24561 Contracture, right knee: Secondary | ICD-10-CM | POA: Diagnosis not present

## 2020-03-06 DIAGNOSIS — M24562 Contracture, left knee: Secondary | ICD-10-CM | POA: Diagnosis not present

## 2020-03-07 DIAGNOSIS — M24562 Contracture, left knee: Secondary | ICD-10-CM | POA: Diagnosis not present

## 2020-03-07 DIAGNOSIS — I639 Cerebral infarction, unspecified: Secondary | ICD-10-CM | POA: Diagnosis not present

## 2020-03-07 DIAGNOSIS — A419 Sepsis, unspecified organism: Secondary | ICD-10-CM | POA: Diagnosis not present

## 2020-03-07 DIAGNOSIS — L89890 Pressure ulcer of other site, unstageable: Secondary | ICD-10-CM | POA: Diagnosis not present

## 2020-03-07 DIAGNOSIS — R2681 Unsteadiness on feet: Secondary | ICD-10-CM | POA: Diagnosis not present

## 2020-03-07 DIAGNOSIS — R0602 Shortness of breath: Secondary | ICD-10-CM | POA: Diagnosis not present

## 2020-03-07 DIAGNOSIS — R278 Other lack of coordination: Secondary | ICD-10-CM | POA: Diagnosis not present

## 2020-03-07 DIAGNOSIS — M5136 Other intervertebral disc degeneration, lumbar region: Secondary | ICD-10-CM | POA: Diagnosis not present

## 2020-03-07 DIAGNOSIS — M24561 Contracture, right knee: Secondary | ICD-10-CM | POA: Diagnosis not present

## 2020-03-07 DIAGNOSIS — M5030 Other cervical disc degeneration, unspecified cervical region: Secondary | ICD-10-CM | POA: Diagnosis not present

## 2020-03-08 DIAGNOSIS — I639 Cerebral infarction, unspecified: Secondary | ICD-10-CM | POA: Diagnosis not present

## 2020-03-08 DIAGNOSIS — M5030 Other cervical disc degeneration, unspecified cervical region: Secondary | ICD-10-CM | POA: Diagnosis not present

## 2020-03-08 DIAGNOSIS — M5136 Other intervertebral disc degeneration, lumbar region: Secondary | ICD-10-CM | POA: Diagnosis not present

## 2020-03-08 DIAGNOSIS — M24562 Contracture, left knee: Secondary | ICD-10-CM | POA: Diagnosis not present

## 2020-03-08 DIAGNOSIS — R0602 Shortness of breath: Secondary | ICD-10-CM | POA: Diagnosis not present

## 2020-03-08 DIAGNOSIS — R278 Other lack of coordination: Secondary | ICD-10-CM | POA: Diagnosis not present

## 2020-03-08 DIAGNOSIS — R2681 Unsteadiness on feet: Secondary | ICD-10-CM | POA: Diagnosis not present

## 2020-03-08 DIAGNOSIS — A419 Sepsis, unspecified organism: Secondary | ICD-10-CM | POA: Diagnosis not present

## 2020-03-08 DIAGNOSIS — M24561 Contracture, right knee: Secondary | ICD-10-CM | POA: Diagnosis not present

## 2020-03-08 DIAGNOSIS — L89890 Pressure ulcer of other site, unstageable: Secondary | ICD-10-CM | POA: Diagnosis not present

## 2020-03-09 DIAGNOSIS — L89890 Pressure ulcer of other site, unstageable: Secondary | ICD-10-CM | POA: Diagnosis not present

## 2020-03-09 DIAGNOSIS — A419 Sepsis, unspecified organism: Secondary | ICD-10-CM | POA: Diagnosis not present

## 2020-03-09 DIAGNOSIS — I639 Cerebral infarction, unspecified: Secondary | ICD-10-CM | POA: Diagnosis not present

## 2020-03-09 DIAGNOSIS — M5030 Other cervical disc degeneration, unspecified cervical region: Secondary | ICD-10-CM | POA: Diagnosis not present

## 2020-03-09 DIAGNOSIS — R278 Other lack of coordination: Secondary | ICD-10-CM | POA: Diagnosis not present

## 2020-03-09 DIAGNOSIS — R2681 Unsteadiness on feet: Secondary | ICD-10-CM | POA: Diagnosis not present

## 2020-03-09 DIAGNOSIS — M24562 Contracture, left knee: Secondary | ICD-10-CM | POA: Diagnosis not present

## 2020-03-09 DIAGNOSIS — M24561 Contracture, right knee: Secondary | ICD-10-CM | POA: Diagnosis not present

## 2020-03-09 DIAGNOSIS — R0602 Shortness of breath: Secondary | ICD-10-CM | POA: Diagnosis not present

## 2020-03-09 DIAGNOSIS — M5136 Other intervertebral disc degeneration, lumbar region: Secondary | ICD-10-CM | POA: Diagnosis not present

## 2020-03-10 DIAGNOSIS — M5136 Other intervertebral disc degeneration, lumbar region: Secondary | ICD-10-CM | POA: Diagnosis not present

## 2020-03-10 DIAGNOSIS — R0602 Shortness of breath: Secondary | ICD-10-CM | POA: Diagnosis not present

## 2020-03-10 DIAGNOSIS — L89890 Pressure ulcer of other site, unstageable: Secondary | ICD-10-CM | POA: Diagnosis not present

## 2020-03-10 DIAGNOSIS — M5030 Other cervical disc degeneration, unspecified cervical region: Secondary | ICD-10-CM | POA: Diagnosis not present

## 2020-03-10 DIAGNOSIS — I639 Cerebral infarction, unspecified: Secondary | ICD-10-CM | POA: Diagnosis not present

## 2020-03-10 DIAGNOSIS — M24561 Contracture, right knee: Secondary | ICD-10-CM | POA: Diagnosis not present

## 2020-03-10 DIAGNOSIS — A419 Sepsis, unspecified organism: Secondary | ICD-10-CM | POA: Diagnosis not present

## 2020-03-10 DIAGNOSIS — M24562 Contracture, left knee: Secondary | ICD-10-CM | POA: Diagnosis not present

## 2020-03-10 DIAGNOSIS — R2681 Unsteadiness on feet: Secondary | ICD-10-CM | POA: Diagnosis not present

## 2020-03-10 DIAGNOSIS — R278 Other lack of coordination: Secondary | ICD-10-CM | POA: Diagnosis not present

## 2020-03-11 DIAGNOSIS — R2681 Unsteadiness on feet: Secondary | ICD-10-CM | POA: Diagnosis not present

## 2020-03-11 DIAGNOSIS — R278 Other lack of coordination: Secondary | ICD-10-CM | POA: Diagnosis not present

## 2020-03-11 DIAGNOSIS — I639 Cerebral infarction, unspecified: Secondary | ICD-10-CM | POA: Diagnosis not present

## 2020-03-11 DIAGNOSIS — M5030 Other cervical disc degeneration, unspecified cervical region: Secondary | ICD-10-CM | POA: Diagnosis not present

## 2020-03-11 DIAGNOSIS — M24561 Contracture, right knee: Secondary | ICD-10-CM | POA: Diagnosis not present

## 2020-03-11 DIAGNOSIS — L89890 Pressure ulcer of other site, unstageable: Secondary | ICD-10-CM | POA: Diagnosis not present

## 2020-03-11 DIAGNOSIS — R0602 Shortness of breath: Secondary | ICD-10-CM | POA: Diagnosis not present

## 2020-03-11 DIAGNOSIS — M24562 Contracture, left knee: Secondary | ICD-10-CM | POA: Diagnosis not present

## 2020-03-11 DIAGNOSIS — M5136 Other intervertebral disc degeneration, lumbar region: Secondary | ICD-10-CM | POA: Diagnosis not present

## 2020-03-11 DIAGNOSIS — A419 Sepsis, unspecified organism: Secondary | ICD-10-CM | POA: Diagnosis not present

## 2020-03-12 DIAGNOSIS — R2681 Unsteadiness on feet: Secondary | ICD-10-CM | POA: Diagnosis not present

## 2020-03-12 DIAGNOSIS — M24561 Contracture, right knee: Secondary | ICD-10-CM | POA: Diagnosis not present

## 2020-03-12 DIAGNOSIS — A419 Sepsis, unspecified organism: Secondary | ICD-10-CM | POA: Diagnosis not present

## 2020-03-12 DIAGNOSIS — R0602 Shortness of breath: Secondary | ICD-10-CM | POA: Diagnosis not present

## 2020-03-12 DIAGNOSIS — M24562 Contracture, left knee: Secondary | ICD-10-CM | POA: Diagnosis not present

## 2020-03-12 DIAGNOSIS — R278 Other lack of coordination: Secondary | ICD-10-CM | POA: Diagnosis not present

## 2020-03-12 DIAGNOSIS — I639 Cerebral infarction, unspecified: Secondary | ICD-10-CM | POA: Diagnosis not present

## 2020-03-12 DIAGNOSIS — L89890 Pressure ulcer of other site, unstageable: Secondary | ICD-10-CM | POA: Diagnosis not present

## 2020-03-12 DIAGNOSIS — M5136 Other intervertebral disc degeneration, lumbar region: Secondary | ICD-10-CM | POA: Diagnosis not present

## 2020-03-12 DIAGNOSIS — M5030 Other cervical disc degeneration, unspecified cervical region: Secondary | ICD-10-CM | POA: Diagnosis not present

## 2020-03-13 DIAGNOSIS — M24562 Contracture, left knee: Secondary | ICD-10-CM | POA: Diagnosis not present

## 2020-03-13 DIAGNOSIS — I639 Cerebral infarction, unspecified: Secondary | ICD-10-CM | POA: Diagnosis not present

## 2020-03-13 DIAGNOSIS — M5030 Other cervical disc degeneration, unspecified cervical region: Secondary | ICD-10-CM | POA: Diagnosis not present

## 2020-03-13 DIAGNOSIS — R278 Other lack of coordination: Secondary | ICD-10-CM | POA: Diagnosis not present

## 2020-03-13 DIAGNOSIS — A419 Sepsis, unspecified organism: Secondary | ICD-10-CM | POA: Diagnosis not present

## 2020-03-13 DIAGNOSIS — R0602 Shortness of breath: Secondary | ICD-10-CM | POA: Diagnosis not present

## 2020-03-13 DIAGNOSIS — M24561 Contracture, right knee: Secondary | ICD-10-CM | POA: Diagnosis not present

## 2020-03-13 DIAGNOSIS — M5136 Other intervertebral disc degeneration, lumbar region: Secondary | ICD-10-CM | POA: Diagnosis not present

## 2020-03-13 DIAGNOSIS — R2681 Unsteadiness on feet: Secondary | ICD-10-CM | POA: Diagnosis not present

## 2020-03-13 DIAGNOSIS — L89890 Pressure ulcer of other site, unstageable: Secondary | ICD-10-CM | POA: Diagnosis not present

## 2020-03-15 DIAGNOSIS — M24561 Contracture, right knee: Secondary | ICD-10-CM | POA: Diagnosis not present

## 2020-03-15 DIAGNOSIS — R278 Other lack of coordination: Secondary | ICD-10-CM | POA: Diagnosis not present

## 2020-03-15 DIAGNOSIS — R0602 Shortness of breath: Secondary | ICD-10-CM | POA: Diagnosis not present

## 2020-03-15 DIAGNOSIS — M24562 Contracture, left knee: Secondary | ICD-10-CM | POA: Diagnosis not present

## 2020-03-15 DIAGNOSIS — I639 Cerebral infarction, unspecified: Secondary | ICD-10-CM | POA: Diagnosis not present

## 2020-03-15 DIAGNOSIS — R2681 Unsteadiness on feet: Secondary | ICD-10-CM | POA: Diagnosis not present

## 2020-03-15 DIAGNOSIS — M5136 Other intervertebral disc degeneration, lumbar region: Secondary | ICD-10-CM | POA: Diagnosis not present

## 2020-03-15 DIAGNOSIS — L89890 Pressure ulcer of other site, unstageable: Secondary | ICD-10-CM | POA: Diagnosis not present

## 2020-03-15 DIAGNOSIS — M5030 Other cervical disc degeneration, unspecified cervical region: Secondary | ICD-10-CM | POA: Diagnosis not present

## 2020-03-15 DIAGNOSIS — A419 Sepsis, unspecified organism: Secondary | ICD-10-CM | POA: Diagnosis not present

## 2020-03-16 DIAGNOSIS — R2681 Unsteadiness on feet: Secondary | ICD-10-CM | POA: Diagnosis not present

## 2020-03-16 DIAGNOSIS — R0602 Shortness of breath: Secondary | ICD-10-CM | POA: Diagnosis not present

## 2020-03-16 DIAGNOSIS — M24561 Contracture, right knee: Secondary | ICD-10-CM | POA: Diagnosis not present

## 2020-03-16 DIAGNOSIS — R278 Other lack of coordination: Secondary | ICD-10-CM | POA: Diagnosis not present

## 2020-03-16 DIAGNOSIS — A419 Sepsis, unspecified organism: Secondary | ICD-10-CM | POA: Diagnosis not present

## 2020-03-16 DIAGNOSIS — I639 Cerebral infarction, unspecified: Secondary | ICD-10-CM | POA: Diagnosis not present

## 2020-03-16 DIAGNOSIS — M5030 Other cervical disc degeneration, unspecified cervical region: Secondary | ICD-10-CM | POA: Diagnosis not present

## 2020-03-16 DIAGNOSIS — L89890 Pressure ulcer of other site, unstageable: Secondary | ICD-10-CM | POA: Diagnosis not present

## 2020-03-16 DIAGNOSIS — M5136 Other intervertebral disc degeneration, lumbar region: Secondary | ICD-10-CM | POA: Diagnosis not present

## 2020-03-16 DIAGNOSIS — M24562 Contracture, left knee: Secondary | ICD-10-CM | POA: Diagnosis not present

## 2020-03-17 DIAGNOSIS — M24562 Contracture, left knee: Secondary | ICD-10-CM | POA: Diagnosis not present

## 2020-03-17 DIAGNOSIS — M5136 Other intervertebral disc degeneration, lumbar region: Secondary | ICD-10-CM | POA: Diagnosis not present

## 2020-03-17 DIAGNOSIS — R278 Other lack of coordination: Secondary | ICD-10-CM | POA: Diagnosis not present

## 2020-03-17 DIAGNOSIS — M5030 Other cervical disc degeneration, unspecified cervical region: Secondary | ICD-10-CM | POA: Diagnosis not present

## 2020-03-17 DIAGNOSIS — I639 Cerebral infarction, unspecified: Secondary | ICD-10-CM | POA: Diagnosis not present

## 2020-03-17 DIAGNOSIS — M24561 Contracture, right knee: Secondary | ICD-10-CM | POA: Diagnosis not present

## 2020-03-17 DIAGNOSIS — R0602 Shortness of breath: Secondary | ICD-10-CM | POA: Diagnosis not present

## 2020-03-17 DIAGNOSIS — A419 Sepsis, unspecified organism: Secondary | ICD-10-CM | POA: Diagnosis not present

## 2020-03-17 DIAGNOSIS — L89890 Pressure ulcer of other site, unstageable: Secondary | ICD-10-CM | POA: Diagnosis not present

## 2020-03-17 DIAGNOSIS — R2681 Unsteadiness on feet: Secondary | ICD-10-CM | POA: Diagnosis not present

## 2020-03-18 DIAGNOSIS — M24561 Contracture, right knee: Secondary | ICD-10-CM | POA: Diagnosis not present

## 2020-03-18 DIAGNOSIS — M24562 Contracture, left knee: Secondary | ICD-10-CM | POA: Diagnosis not present

## 2020-03-18 DIAGNOSIS — I639 Cerebral infarction, unspecified: Secondary | ICD-10-CM | POA: Diagnosis not present

## 2020-03-18 DIAGNOSIS — M5136 Other intervertebral disc degeneration, lumbar region: Secondary | ICD-10-CM | POA: Diagnosis not present

## 2020-03-18 DIAGNOSIS — M5030 Other cervical disc degeneration, unspecified cervical region: Secondary | ICD-10-CM | POA: Diagnosis not present

## 2020-03-18 DIAGNOSIS — R278 Other lack of coordination: Secondary | ICD-10-CM | POA: Diagnosis not present

## 2020-03-18 DIAGNOSIS — L89890 Pressure ulcer of other site, unstageable: Secondary | ICD-10-CM | POA: Diagnosis not present

## 2020-03-18 DIAGNOSIS — R0602 Shortness of breath: Secondary | ICD-10-CM | POA: Diagnosis not present

## 2020-03-18 DIAGNOSIS — A419 Sepsis, unspecified organism: Secondary | ICD-10-CM | POA: Diagnosis not present

## 2020-03-18 DIAGNOSIS — R2681 Unsteadiness on feet: Secondary | ICD-10-CM | POA: Diagnosis not present

## 2020-03-19 DIAGNOSIS — A419 Sepsis, unspecified organism: Secondary | ICD-10-CM | POA: Diagnosis not present

## 2020-03-19 DIAGNOSIS — M24562 Contracture, left knee: Secondary | ICD-10-CM | POA: Diagnosis not present

## 2020-03-19 DIAGNOSIS — M5030 Other cervical disc degeneration, unspecified cervical region: Secondary | ICD-10-CM | POA: Diagnosis not present

## 2020-03-19 DIAGNOSIS — R0602 Shortness of breath: Secondary | ICD-10-CM | POA: Diagnosis not present

## 2020-03-19 DIAGNOSIS — R278 Other lack of coordination: Secondary | ICD-10-CM | POA: Diagnosis not present

## 2020-03-19 DIAGNOSIS — L89890 Pressure ulcer of other site, unstageable: Secondary | ICD-10-CM | POA: Diagnosis not present

## 2020-03-19 DIAGNOSIS — M24561 Contracture, right knee: Secondary | ICD-10-CM | POA: Diagnosis not present

## 2020-03-19 DIAGNOSIS — R2681 Unsteadiness on feet: Secondary | ICD-10-CM | POA: Diagnosis not present

## 2020-03-19 DIAGNOSIS — M5136 Other intervertebral disc degeneration, lumbar region: Secondary | ICD-10-CM | POA: Diagnosis not present

## 2020-03-19 DIAGNOSIS — I639 Cerebral infarction, unspecified: Secondary | ICD-10-CM | POA: Diagnosis not present

## 2020-03-20 DIAGNOSIS — I69320 Aphasia following cerebral infarction: Secondary | ICD-10-CM | POA: Diagnosis not present

## 2020-03-20 DIAGNOSIS — R278 Other lack of coordination: Secondary | ICD-10-CM | POA: Diagnosis not present

## 2020-03-20 DIAGNOSIS — M5136 Other intervertebral disc degeneration, lumbar region: Secondary | ICD-10-CM | POA: Diagnosis not present

## 2020-03-20 DIAGNOSIS — R2681 Unsteadiness on feet: Secondary | ICD-10-CM | POA: Diagnosis not present

## 2020-03-20 DIAGNOSIS — D869 Sarcoidosis, unspecified: Secondary | ICD-10-CM | POA: Diagnosis not present

## 2020-03-20 DIAGNOSIS — A419 Sepsis, unspecified organism: Secondary | ICD-10-CM | POA: Diagnosis not present

## 2020-03-20 DIAGNOSIS — I639 Cerebral infarction, unspecified: Secondary | ICD-10-CM | POA: Diagnosis not present

## 2020-03-20 DIAGNOSIS — L89890 Pressure ulcer of other site, unstageable: Secondary | ICD-10-CM | POA: Diagnosis not present

## 2020-03-20 DIAGNOSIS — M24561 Contracture, right knee: Secondary | ICD-10-CM | POA: Diagnosis not present

## 2020-03-20 DIAGNOSIS — M5030 Other cervical disc degeneration, unspecified cervical region: Secondary | ICD-10-CM | POA: Diagnosis not present

## 2020-03-20 DIAGNOSIS — I69354 Hemiplegia and hemiparesis following cerebral infarction affecting left non-dominant side: Secondary | ICD-10-CM | POA: Diagnosis not present

## 2020-03-20 DIAGNOSIS — M24562 Contracture, left knee: Secondary | ICD-10-CM | POA: Diagnosis not present

## 2020-03-20 DIAGNOSIS — R0602 Shortness of breath: Secondary | ICD-10-CM | POA: Diagnosis not present

## 2020-03-21 DIAGNOSIS — R2681 Unsteadiness on feet: Secondary | ICD-10-CM | POA: Diagnosis not present

## 2020-03-21 DIAGNOSIS — M24562 Contracture, left knee: Secondary | ICD-10-CM | POA: Diagnosis not present

## 2020-03-21 DIAGNOSIS — M5030 Other cervical disc degeneration, unspecified cervical region: Secondary | ICD-10-CM | POA: Diagnosis not present

## 2020-03-21 DIAGNOSIS — A419 Sepsis, unspecified organism: Secondary | ICD-10-CM | POA: Diagnosis not present

## 2020-03-21 DIAGNOSIS — R278 Other lack of coordination: Secondary | ICD-10-CM | POA: Diagnosis not present

## 2020-03-21 DIAGNOSIS — M24561 Contracture, right knee: Secondary | ICD-10-CM | POA: Diagnosis not present

## 2020-03-21 DIAGNOSIS — M5136 Other intervertebral disc degeneration, lumbar region: Secondary | ICD-10-CM | POA: Diagnosis not present

## 2020-03-21 DIAGNOSIS — L89890 Pressure ulcer of other site, unstageable: Secondary | ICD-10-CM | POA: Diagnosis not present

## 2020-03-21 DIAGNOSIS — R0602 Shortness of breath: Secondary | ICD-10-CM | POA: Diagnosis not present

## 2020-03-21 DIAGNOSIS — I639 Cerebral infarction, unspecified: Secondary | ICD-10-CM | POA: Diagnosis not present

## 2020-03-22 DIAGNOSIS — R2681 Unsteadiness on feet: Secondary | ICD-10-CM | POA: Diagnosis not present

## 2020-03-22 DIAGNOSIS — R0602 Shortness of breath: Secondary | ICD-10-CM | POA: Diagnosis not present

## 2020-03-22 DIAGNOSIS — M5030 Other cervical disc degeneration, unspecified cervical region: Secondary | ICD-10-CM | POA: Diagnosis not present

## 2020-03-22 DIAGNOSIS — A419 Sepsis, unspecified organism: Secondary | ICD-10-CM | POA: Diagnosis not present

## 2020-03-22 DIAGNOSIS — I639 Cerebral infarction, unspecified: Secondary | ICD-10-CM | POA: Diagnosis not present

## 2020-03-22 DIAGNOSIS — L89890 Pressure ulcer of other site, unstageable: Secondary | ICD-10-CM | POA: Diagnosis not present

## 2020-03-22 DIAGNOSIS — M5136 Other intervertebral disc degeneration, lumbar region: Secondary | ICD-10-CM | POA: Diagnosis not present

## 2020-03-22 DIAGNOSIS — M24561 Contracture, right knee: Secondary | ICD-10-CM | POA: Diagnosis not present

## 2020-03-22 DIAGNOSIS — R278 Other lack of coordination: Secondary | ICD-10-CM | POA: Diagnosis not present

## 2020-03-22 DIAGNOSIS — M24562 Contracture, left knee: Secondary | ICD-10-CM | POA: Diagnosis not present

## 2020-03-23 DIAGNOSIS — M24562 Contracture, left knee: Secondary | ICD-10-CM | POA: Diagnosis not present

## 2020-03-23 DIAGNOSIS — R278 Other lack of coordination: Secondary | ICD-10-CM | POA: Diagnosis not present

## 2020-03-23 DIAGNOSIS — M5030 Other cervical disc degeneration, unspecified cervical region: Secondary | ICD-10-CM | POA: Diagnosis not present

## 2020-03-23 DIAGNOSIS — M5136 Other intervertebral disc degeneration, lumbar region: Secondary | ICD-10-CM | POA: Diagnosis not present

## 2020-03-23 DIAGNOSIS — L89890 Pressure ulcer of other site, unstageable: Secondary | ICD-10-CM | POA: Diagnosis not present

## 2020-03-23 DIAGNOSIS — I639 Cerebral infarction, unspecified: Secondary | ICD-10-CM | POA: Diagnosis not present

## 2020-03-23 DIAGNOSIS — A419 Sepsis, unspecified organism: Secondary | ICD-10-CM | POA: Diagnosis not present

## 2020-03-23 DIAGNOSIS — M24561 Contracture, right knee: Secondary | ICD-10-CM | POA: Diagnosis not present

## 2020-03-23 DIAGNOSIS — R2681 Unsteadiness on feet: Secondary | ICD-10-CM | POA: Diagnosis not present

## 2020-03-23 DIAGNOSIS — R0602 Shortness of breath: Secondary | ICD-10-CM | POA: Diagnosis not present

## 2020-03-24 DIAGNOSIS — A419 Sepsis, unspecified organism: Secondary | ICD-10-CM | POA: Diagnosis not present

## 2020-03-24 DIAGNOSIS — M24562 Contracture, left knee: Secondary | ICD-10-CM | POA: Diagnosis not present

## 2020-03-24 DIAGNOSIS — M5136 Other intervertebral disc degeneration, lumbar region: Secondary | ICD-10-CM | POA: Diagnosis not present

## 2020-03-24 DIAGNOSIS — L89890 Pressure ulcer of other site, unstageable: Secondary | ICD-10-CM | POA: Diagnosis not present

## 2020-03-24 DIAGNOSIS — R278 Other lack of coordination: Secondary | ICD-10-CM | POA: Diagnosis not present

## 2020-03-24 DIAGNOSIS — R0602 Shortness of breath: Secondary | ICD-10-CM | POA: Diagnosis not present

## 2020-03-24 DIAGNOSIS — I639 Cerebral infarction, unspecified: Secondary | ICD-10-CM | POA: Diagnosis not present

## 2020-03-24 DIAGNOSIS — R2681 Unsteadiness on feet: Secondary | ICD-10-CM | POA: Diagnosis not present

## 2020-03-24 DIAGNOSIS — M5030 Other cervical disc degeneration, unspecified cervical region: Secondary | ICD-10-CM | POA: Diagnosis not present

## 2020-03-24 DIAGNOSIS — M24561 Contracture, right knee: Secondary | ICD-10-CM | POA: Diagnosis not present

## 2020-03-25 DIAGNOSIS — M24561 Contracture, right knee: Secondary | ICD-10-CM | POA: Diagnosis not present

## 2020-03-25 DIAGNOSIS — R278 Other lack of coordination: Secondary | ICD-10-CM | POA: Diagnosis not present

## 2020-03-25 DIAGNOSIS — M5136 Other intervertebral disc degeneration, lumbar region: Secondary | ICD-10-CM | POA: Diagnosis not present

## 2020-03-25 DIAGNOSIS — I639 Cerebral infarction, unspecified: Secondary | ICD-10-CM | POA: Diagnosis not present

## 2020-03-25 DIAGNOSIS — R0602 Shortness of breath: Secondary | ICD-10-CM | POA: Diagnosis not present

## 2020-03-25 DIAGNOSIS — L89890 Pressure ulcer of other site, unstageable: Secondary | ICD-10-CM | POA: Diagnosis not present

## 2020-03-25 DIAGNOSIS — M5030 Other cervical disc degeneration, unspecified cervical region: Secondary | ICD-10-CM | POA: Diagnosis not present

## 2020-03-25 DIAGNOSIS — M24562 Contracture, left knee: Secondary | ICD-10-CM | POA: Diagnosis not present

## 2020-03-25 DIAGNOSIS — A419 Sepsis, unspecified organism: Secondary | ICD-10-CM | POA: Diagnosis not present

## 2020-03-25 DIAGNOSIS — R2681 Unsteadiness on feet: Secondary | ICD-10-CM | POA: Diagnosis not present

## 2020-03-26 DIAGNOSIS — I639 Cerebral infarction, unspecified: Secondary | ICD-10-CM | POA: Diagnosis not present

## 2020-03-26 DIAGNOSIS — A419 Sepsis, unspecified organism: Secondary | ICD-10-CM | POA: Diagnosis not present

## 2020-03-26 DIAGNOSIS — R2681 Unsteadiness on feet: Secondary | ICD-10-CM | POA: Diagnosis not present

## 2020-03-26 DIAGNOSIS — L89890 Pressure ulcer of other site, unstageable: Secondary | ICD-10-CM | POA: Diagnosis not present

## 2020-03-26 DIAGNOSIS — R0602 Shortness of breath: Secondary | ICD-10-CM | POA: Diagnosis not present

## 2020-03-26 DIAGNOSIS — M24562 Contracture, left knee: Secondary | ICD-10-CM | POA: Diagnosis not present

## 2020-03-26 DIAGNOSIS — R278 Other lack of coordination: Secondary | ICD-10-CM | POA: Diagnosis not present

## 2020-03-26 DIAGNOSIS — M5136 Other intervertebral disc degeneration, lumbar region: Secondary | ICD-10-CM | POA: Diagnosis not present

## 2020-03-26 DIAGNOSIS — M24561 Contracture, right knee: Secondary | ICD-10-CM | POA: Diagnosis not present

## 2020-03-26 DIAGNOSIS — M5030 Other cervical disc degeneration, unspecified cervical region: Secondary | ICD-10-CM | POA: Diagnosis not present

## 2020-03-29 DIAGNOSIS — M24561 Contracture, right knee: Secondary | ICD-10-CM | POA: Diagnosis not present

## 2020-03-29 DIAGNOSIS — M5030 Other cervical disc degeneration, unspecified cervical region: Secondary | ICD-10-CM | POA: Diagnosis not present

## 2020-03-29 DIAGNOSIS — L89890 Pressure ulcer of other site, unstageable: Secondary | ICD-10-CM | POA: Diagnosis not present

## 2020-03-29 DIAGNOSIS — A419 Sepsis, unspecified organism: Secondary | ICD-10-CM | POA: Diagnosis not present

## 2020-03-29 DIAGNOSIS — M24562 Contracture, left knee: Secondary | ICD-10-CM | POA: Diagnosis not present

## 2020-03-29 DIAGNOSIS — R278 Other lack of coordination: Secondary | ICD-10-CM | POA: Diagnosis not present

## 2020-03-29 DIAGNOSIS — I639 Cerebral infarction, unspecified: Secondary | ICD-10-CM | POA: Diagnosis not present

## 2020-03-29 DIAGNOSIS — M5136 Other intervertebral disc degeneration, lumbar region: Secondary | ICD-10-CM | POA: Diagnosis not present

## 2020-03-29 DIAGNOSIS — R0602 Shortness of breath: Secondary | ICD-10-CM | POA: Diagnosis not present

## 2020-03-29 DIAGNOSIS — R2681 Unsteadiness on feet: Secondary | ICD-10-CM | POA: Diagnosis not present

## 2020-03-30 DIAGNOSIS — M5136 Other intervertebral disc degeneration, lumbar region: Secondary | ICD-10-CM | POA: Diagnosis not present

## 2020-03-30 DIAGNOSIS — R2681 Unsteadiness on feet: Secondary | ICD-10-CM | POA: Diagnosis not present

## 2020-03-30 DIAGNOSIS — A419 Sepsis, unspecified organism: Secondary | ICD-10-CM | POA: Diagnosis not present

## 2020-03-30 DIAGNOSIS — L89890 Pressure ulcer of other site, unstageable: Secondary | ICD-10-CM | POA: Diagnosis not present

## 2020-03-30 DIAGNOSIS — M24561 Contracture, right knee: Secondary | ICD-10-CM | POA: Diagnosis not present

## 2020-03-30 DIAGNOSIS — M5030 Other cervical disc degeneration, unspecified cervical region: Secondary | ICD-10-CM | POA: Diagnosis not present

## 2020-03-30 DIAGNOSIS — I639 Cerebral infarction, unspecified: Secondary | ICD-10-CM | POA: Diagnosis not present

## 2020-03-30 DIAGNOSIS — M24562 Contracture, left knee: Secondary | ICD-10-CM | POA: Diagnosis not present

## 2020-03-30 DIAGNOSIS — R0602 Shortness of breath: Secondary | ICD-10-CM | POA: Diagnosis not present

## 2020-03-30 DIAGNOSIS — R278 Other lack of coordination: Secondary | ICD-10-CM | POA: Diagnosis not present

## 2020-03-31 DIAGNOSIS — A419 Sepsis, unspecified organism: Secondary | ICD-10-CM | POA: Diagnosis not present

## 2020-03-31 DIAGNOSIS — L89890 Pressure ulcer of other site, unstageable: Secondary | ICD-10-CM | POA: Diagnosis not present

## 2020-03-31 DIAGNOSIS — M24562 Contracture, left knee: Secondary | ICD-10-CM | POA: Diagnosis not present

## 2020-03-31 DIAGNOSIS — M5030 Other cervical disc degeneration, unspecified cervical region: Secondary | ICD-10-CM | POA: Diagnosis not present

## 2020-03-31 DIAGNOSIS — M5136 Other intervertebral disc degeneration, lumbar region: Secondary | ICD-10-CM | POA: Diagnosis not present

## 2020-03-31 DIAGNOSIS — I639 Cerebral infarction, unspecified: Secondary | ICD-10-CM | POA: Diagnosis not present

## 2020-03-31 DIAGNOSIS — R0602 Shortness of breath: Secondary | ICD-10-CM | POA: Diagnosis not present

## 2020-03-31 DIAGNOSIS — M24561 Contracture, right knee: Secondary | ICD-10-CM | POA: Diagnosis not present

## 2020-03-31 DIAGNOSIS — R278 Other lack of coordination: Secondary | ICD-10-CM | POA: Diagnosis not present

## 2020-03-31 DIAGNOSIS — R2681 Unsteadiness on feet: Secondary | ICD-10-CM | POA: Diagnosis not present

## 2020-04-01 DIAGNOSIS — R0602 Shortness of breath: Secondary | ICD-10-CM | POA: Diagnosis not present

## 2020-04-01 DIAGNOSIS — A419 Sepsis, unspecified organism: Secondary | ICD-10-CM | POA: Diagnosis not present

## 2020-04-01 DIAGNOSIS — M5136 Other intervertebral disc degeneration, lumbar region: Secondary | ICD-10-CM | POA: Diagnosis not present

## 2020-04-01 DIAGNOSIS — M24561 Contracture, right knee: Secondary | ICD-10-CM | POA: Diagnosis not present

## 2020-04-01 DIAGNOSIS — I639 Cerebral infarction, unspecified: Secondary | ICD-10-CM | POA: Diagnosis not present

## 2020-04-01 DIAGNOSIS — L89890 Pressure ulcer of other site, unstageable: Secondary | ICD-10-CM | POA: Diagnosis not present

## 2020-04-01 DIAGNOSIS — M24562 Contracture, left knee: Secondary | ICD-10-CM | POA: Diagnosis not present

## 2020-04-01 DIAGNOSIS — R278 Other lack of coordination: Secondary | ICD-10-CM | POA: Diagnosis not present

## 2020-04-01 DIAGNOSIS — M5030 Other cervical disc degeneration, unspecified cervical region: Secondary | ICD-10-CM | POA: Diagnosis not present

## 2020-04-01 DIAGNOSIS — R2681 Unsteadiness on feet: Secondary | ICD-10-CM | POA: Diagnosis not present

## 2020-04-02 DIAGNOSIS — R2681 Unsteadiness on feet: Secondary | ICD-10-CM | POA: Diagnosis not present

## 2020-04-02 DIAGNOSIS — I639 Cerebral infarction, unspecified: Secondary | ICD-10-CM | POA: Diagnosis not present

## 2020-04-02 DIAGNOSIS — R0602 Shortness of breath: Secondary | ICD-10-CM | POA: Diagnosis not present

## 2020-04-02 DIAGNOSIS — R278 Other lack of coordination: Secondary | ICD-10-CM | POA: Diagnosis not present

## 2020-04-02 DIAGNOSIS — M24562 Contracture, left knee: Secondary | ICD-10-CM | POA: Diagnosis not present

## 2020-04-02 DIAGNOSIS — A419 Sepsis, unspecified organism: Secondary | ICD-10-CM | POA: Diagnosis not present

## 2020-04-02 DIAGNOSIS — M5136 Other intervertebral disc degeneration, lumbar region: Secondary | ICD-10-CM | POA: Diagnosis not present

## 2020-04-02 DIAGNOSIS — L89890 Pressure ulcer of other site, unstageable: Secondary | ICD-10-CM | POA: Diagnosis not present

## 2020-04-02 DIAGNOSIS — M24561 Contracture, right knee: Secondary | ICD-10-CM | POA: Diagnosis not present

## 2020-04-02 DIAGNOSIS — M5030 Other cervical disc degeneration, unspecified cervical region: Secondary | ICD-10-CM | POA: Diagnosis not present

## 2020-04-03 DIAGNOSIS — R0602 Shortness of breath: Secondary | ICD-10-CM | POA: Diagnosis not present

## 2020-04-03 DIAGNOSIS — M24562 Contracture, left knee: Secondary | ICD-10-CM | POA: Diagnosis not present

## 2020-04-03 DIAGNOSIS — M5030 Other cervical disc degeneration, unspecified cervical region: Secondary | ICD-10-CM | POA: Diagnosis not present

## 2020-04-03 DIAGNOSIS — A419 Sepsis, unspecified organism: Secondary | ICD-10-CM | POA: Diagnosis not present

## 2020-04-03 DIAGNOSIS — L89890 Pressure ulcer of other site, unstageable: Secondary | ICD-10-CM | POA: Diagnosis not present

## 2020-04-03 DIAGNOSIS — R278 Other lack of coordination: Secondary | ICD-10-CM | POA: Diagnosis not present

## 2020-04-03 DIAGNOSIS — M5136 Other intervertebral disc degeneration, lumbar region: Secondary | ICD-10-CM | POA: Diagnosis not present

## 2020-04-03 DIAGNOSIS — I639 Cerebral infarction, unspecified: Secondary | ICD-10-CM | POA: Diagnosis not present

## 2020-04-03 DIAGNOSIS — R2681 Unsteadiness on feet: Secondary | ICD-10-CM | POA: Diagnosis not present

## 2020-04-03 DIAGNOSIS — M24561 Contracture, right knee: Secondary | ICD-10-CM | POA: Diagnosis not present

## 2020-04-06 DIAGNOSIS — L89890 Pressure ulcer of other site, unstageable: Secondary | ICD-10-CM | POA: Diagnosis not present

## 2020-04-06 DIAGNOSIS — A419 Sepsis, unspecified organism: Secondary | ICD-10-CM | POA: Diagnosis not present

## 2020-04-06 DIAGNOSIS — M5136 Other intervertebral disc degeneration, lumbar region: Secondary | ICD-10-CM | POA: Diagnosis not present

## 2020-04-06 DIAGNOSIS — M24562 Contracture, left knee: Secondary | ICD-10-CM | POA: Diagnosis not present

## 2020-04-06 DIAGNOSIS — R278 Other lack of coordination: Secondary | ICD-10-CM | POA: Diagnosis not present

## 2020-04-06 DIAGNOSIS — R2681 Unsteadiness on feet: Secondary | ICD-10-CM | POA: Diagnosis not present

## 2020-04-06 DIAGNOSIS — I639 Cerebral infarction, unspecified: Secondary | ICD-10-CM | POA: Diagnosis not present

## 2020-04-06 DIAGNOSIS — R0602 Shortness of breath: Secondary | ICD-10-CM | POA: Diagnosis not present

## 2020-04-06 DIAGNOSIS — M24561 Contracture, right knee: Secondary | ICD-10-CM | POA: Diagnosis not present

## 2020-04-06 DIAGNOSIS — M5030 Other cervical disc degeneration, unspecified cervical region: Secondary | ICD-10-CM | POA: Diagnosis not present

## 2020-04-07 DIAGNOSIS — I639 Cerebral infarction, unspecified: Secondary | ICD-10-CM | POA: Diagnosis not present

## 2020-04-07 DIAGNOSIS — R2681 Unsteadiness on feet: Secondary | ICD-10-CM | POA: Diagnosis not present

## 2020-04-07 DIAGNOSIS — M5136 Other intervertebral disc degeneration, lumbar region: Secondary | ICD-10-CM | POA: Diagnosis not present

## 2020-04-07 DIAGNOSIS — I69354 Hemiplegia and hemiparesis following cerebral infarction affecting left non-dominant side: Secondary | ICD-10-CM | POA: Diagnosis not present

## 2020-04-07 DIAGNOSIS — A419 Sepsis, unspecified organism: Secondary | ICD-10-CM | POA: Diagnosis not present

## 2020-04-07 DIAGNOSIS — M24561 Contracture, right knee: Secondary | ICD-10-CM | POA: Diagnosis not present

## 2020-04-07 DIAGNOSIS — R0602 Shortness of breath: Secondary | ICD-10-CM | POA: Diagnosis not present

## 2020-04-07 DIAGNOSIS — L89890 Pressure ulcer of other site, unstageable: Secondary | ICD-10-CM | POA: Diagnosis not present

## 2020-04-07 DIAGNOSIS — M24562 Contracture, left knee: Secondary | ICD-10-CM | POA: Diagnosis not present

## 2020-04-07 DIAGNOSIS — R278 Other lack of coordination: Secondary | ICD-10-CM | POA: Diagnosis not present

## 2020-04-07 DIAGNOSIS — M5030 Other cervical disc degeneration, unspecified cervical region: Secondary | ICD-10-CM | POA: Diagnosis not present

## 2020-04-08 DIAGNOSIS — M24562 Contracture, left knee: Secondary | ICD-10-CM | POA: Diagnosis not present

## 2020-04-08 DIAGNOSIS — M5136 Other intervertebral disc degeneration, lumbar region: Secondary | ICD-10-CM | POA: Diagnosis not present

## 2020-04-08 DIAGNOSIS — R278 Other lack of coordination: Secondary | ICD-10-CM | POA: Diagnosis not present

## 2020-04-08 DIAGNOSIS — R0602 Shortness of breath: Secondary | ICD-10-CM | POA: Diagnosis not present

## 2020-04-08 DIAGNOSIS — M24561 Contracture, right knee: Secondary | ICD-10-CM | POA: Diagnosis not present

## 2020-04-08 DIAGNOSIS — L89890 Pressure ulcer of other site, unstageable: Secondary | ICD-10-CM | POA: Diagnosis not present

## 2020-04-08 DIAGNOSIS — I639 Cerebral infarction, unspecified: Secondary | ICD-10-CM | POA: Diagnosis not present

## 2020-04-08 DIAGNOSIS — R2681 Unsteadiness on feet: Secondary | ICD-10-CM | POA: Diagnosis not present

## 2020-04-08 DIAGNOSIS — M5030 Other cervical disc degeneration, unspecified cervical region: Secondary | ICD-10-CM | POA: Diagnosis not present

## 2020-04-08 DIAGNOSIS — A419 Sepsis, unspecified organism: Secondary | ICD-10-CM | POA: Diagnosis not present

## 2020-04-09 DIAGNOSIS — M5136 Other intervertebral disc degeneration, lumbar region: Secondary | ICD-10-CM | POA: Diagnosis not present

## 2020-04-09 DIAGNOSIS — M5030 Other cervical disc degeneration, unspecified cervical region: Secondary | ICD-10-CM | POA: Diagnosis not present

## 2020-04-09 DIAGNOSIS — M24562 Contracture, left knee: Secondary | ICD-10-CM | POA: Diagnosis not present

## 2020-04-09 DIAGNOSIS — A419 Sepsis, unspecified organism: Secondary | ICD-10-CM | POA: Diagnosis not present

## 2020-04-09 DIAGNOSIS — M24561 Contracture, right knee: Secondary | ICD-10-CM | POA: Diagnosis not present

## 2020-04-09 DIAGNOSIS — R278 Other lack of coordination: Secondary | ICD-10-CM | POA: Diagnosis not present

## 2020-04-09 DIAGNOSIS — R2681 Unsteadiness on feet: Secondary | ICD-10-CM | POA: Diagnosis not present

## 2020-04-09 DIAGNOSIS — R0602 Shortness of breath: Secondary | ICD-10-CM | POA: Diagnosis not present

## 2020-04-09 DIAGNOSIS — L89890 Pressure ulcer of other site, unstageable: Secondary | ICD-10-CM | POA: Diagnosis not present

## 2020-04-09 DIAGNOSIS — I639 Cerebral infarction, unspecified: Secondary | ICD-10-CM | POA: Diagnosis not present

## 2020-04-10 DIAGNOSIS — I639 Cerebral infarction, unspecified: Secondary | ICD-10-CM | POA: Diagnosis not present

## 2020-04-10 DIAGNOSIS — M5136 Other intervertebral disc degeneration, lumbar region: Secondary | ICD-10-CM | POA: Diagnosis not present

## 2020-04-10 DIAGNOSIS — R0602 Shortness of breath: Secondary | ICD-10-CM | POA: Diagnosis not present

## 2020-04-10 DIAGNOSIS — R2681 Unsteadiness on feet: Secondary | ICD-10-CM | POA: Diagnosis not present

## 2020-04-10 DIAGNOSIS — A419 Sepsis, unspecified organism: Secondary | ICD-10-CM | POA: Diagnosis not present

## 2020-04-10 DIAGNOSIS — M24561 Contracture, right knee: Secondary | ICD-10-CM | POA: Diagnosis not present

## 2020-04-10 DIAGNOSIS — M24562 Contracture, left knee: Secondary | ICD-10-CM | POA: Diagnosis not present

## 2020-04-10 DIAGNOSIS — L89890 Pressure ulcer of other site, unstageable: Secondary | ICD-10-CM | POA: Diagnosis not present

## 2020-04-10 DIAGNOSIS — R278 Other lack of coordination: Secondary | ICD-10-CM | POA: Diagnosis not present

## 2020-04-10 DIAGNOSIS — M5030 Other cervical disc degeneration, unspecified cervical region: Secondary | ICD-10-CM | POA: Diagnosis not present

## 2020-04-11 DIAGNOSIS — M24562 Contracture, left knee: Secondary | ICD-10-CM | POA: Diagnosis not present

## 2020-04-11 DIAGNOSIS — M24561 Contracture, right knee: Secondary | ICD-10-CM | POA: Diagnosis not present

## 2020-04-11 DIAGNOSIS — R2681 Unsteadiness on feet: Secondary | ICD-10-CM | POA: Diagnosis not present

## 2020-04-11 DIAGNOSIS — A419 Sepsis, unspecified organism: Secondary | ICD-10-CM | POA: Diagnosis not present

## 2020-04-11 DIAGNOSIS — R0602 Shortness of breath: Secondary | ICD-10-CM | POA: Diagnosis not present

## 2020-04-11 DIAGNOSIS — M5136 Other intervertebral disc degeneration, lumbar region: Secondary | ICD-10-CM | POA: Diagnosis not present

## 2020-04-11 DIAGNOSIS — M5030 Other cervical disc degeneration, unspecified cervical region: Secondary | ICD-10-CM | POA: Diagnosis not present

## 2020-04-11 DIAGNOSIS — R278 Other lack of coordination: Secondary | ICD-10-CM | POA: Diagnosis not present

## 2020-04-11 DIAGNOSIS — L89890 Pressure ulcer of other site, unstageable: Secondary | ICD-10-CM | POA: Diagnosis not present

## 2020-04-11 DIAGNOSIS — I639 Cerebral infarction, unspecified: Secondary | ICD-10-CM | POA: Diagnosis not present

## 2020-04-13 DIAGNOSIS — R278 Other lack of coordination: Secondary | ICD-10-CM | POA: Diagnosis not present

## 2020-04-13 DIAGNOSIS — M5030 Other cervical disc degeneration, unspecified cervical region: Secondary | ICD-10-CM | POA: Diagnosis not present

## 2020-04-13 DIAGNOSIS — M5136 Other intervertebral disc degeneration, lumbar region: Secondary | ICD-10-CM | POA: Diagnosis not present

## 2020-04-13 DIAGNOSIS — R0602 Shortness of breath: Secondary | ICD-10-CM | POA: Diagnosis not present

## 2020-04-13 DIAGNOSIS — M24562 Contracture, left knee: Secondary | ICD-10-CM | POA: Diagnosis not present

## 2020-04-13 DIAGNOSIS — M24561 Contracture, right knee: Secondary | ICD-10-CM | POA: Diagnosis not present

## 2020-04-13 DIAGNOSIS — A419 Sepsis, unspecified organism: Secondary | ICD-10-CM | POA: Diagnosis not present

## 2020-04-13 DIAGNOSIS — I639 Cerebral infarction, unspecified: Secondary | ICD-10-CM | POA: Diagnosis not present

## 2020-04-13 DIAGNOSIS — L89890 Pressure ulcer of other site, unstageable: Secondary | ICD-10-CM | POA: Diagnosis not present

## 2020-04-13 DIAGNOSIS — R2681 Unsteadiness on feet: Secondary | ICD-10-CM | POA: Diagnosis not present

## 2020-04-14 DIAGNOSIS — M24562 Contracture, left knee: Secondary | ICD-10-CM | POA: Diagnosis not present

## 2020-04-14 DIAGNOSIS — M5030 Other cervical disc degeneration, unspecified cervical region: Secondary | ICD-10-CM | POA: Diagnosis not present

## 2020-04-14 DIAGNOSIS — R2681 Unsteadiness on feet: Secondary | ICD-10-CM | POA: Diagnosis not present

## 2020-04-14 DIAGNOSIS — A419 Sepsis, unspecified organism: Secondary | ICD-10-CM | POA: Diagnosis not present

## 2020-04-14 DIAGNOSIS — M24561 Contracture, right knee: Secondary | ICD-10-CM | POA: Diagnosis not present

## 2020-04-14 DIAGNOSIS — R278 Other lack of coordination: Secondary | ICD-10-CM | POA: Diagnosis not present

## 2020-04-14 DIAGNOSIS — L89890 Pressure ulcer of other site, unstageable: Secondary | ICD-10-CM | POA: Diagnosis not present

## 2020-04-14 DIAGNOSIS — I639 Cerebral infarction, unspecified: Secondary | ICD-10-CM | POA: Diagnosis not present

## 2020-04-14 DIAGNOSIS — R0602 Shortness of breath: Secondary | ICD-10-CM | POA: Diagnosis not present

## 2020-04-14 DIAGNOSIS — S91301A Unspecified open wound, right foot, initial encounter: Secondary | ICD-10-CM | POA: Diagnosis not present

## 2020-04-14 DIAGNOSIS — M5136 Other intervertebral disc degeneration, lumbar region: Secondary | ICD-10-CM | POA: Diagnosis not present

## 2020-04-15 DIAGNOSIS — M5030 Other cervical disc degeneration, unspecified cervical region: Secondary | ICD-10-CM | POA: Diagnosis not present

## 2020-04-15 DIAGNOSIS — M24561 Contracture, right knee: Secondary | ICD-10-CM | POA: Diagnosis not present

## 2020-04-15 DIAGNOSIS — L89890 Pressure ulcer of other site, unstageable: Secondary | ICD-10-CM | POA: Diagnosis not present

## 2020-04-15 DIAGNOSIS — R2681 Unsteadiness on feet: Secondary | ICD-10-CM | POA: Diagnosis not present

## 2020-04-15 DIAGNOSIS — R0602 Shortness of breath: Secondary | ICD-10-CM | POA: Diagnosis not present

## 2020-04-15 DIAGNOSIS — R278 Other lack of coordination: Secondary | ICD-10-CM | POA: Diagnosis not present

## 2020-04-15 DIAGNOSIS — A419 Sepsis, unspecified organism: Secondary | ICD-10-CM | POA: Diagnosis not present

## 2020-04-15 DIAGNOSIS — M24562 Contracture, left knee: Secondary | ICD-10-CM | POA: Diagnosis not present

## 2020-04-15 DIAGNOSIS — M5136 Other intervertebral disc degeneration, lumbar region: Secondary | ICD-10-CM | POA: Diagnosis not present

## 2020-04-15 DIAGNOSIS — I639 Cerebral infarction, unspecified: Secondary | ICD-10-CM | POA: Diagnosis not present

## 2020-04-16 DIAGNOSIS — M5030 Other cervical disc degeneration, unspecified cervical region: Secondary | ICD-10-CM | POA: Diagnosis not present

## 2020-04-16 DIAGNOSIS — L89890 Pressure ulcer of other site, unstageable: Secondary | ICD-10-CM | POA: Diagnosis not present

## 2020-04-16 DIAGNOSIS — M24562 Contracture, left knee: Secondary | ICD-10-CM | POA: Diagnosis not present

## 2020-04-16 DIAGNOSIS — A419 Sepsis, unspecified organism: Secondary | ICD-10-CM | POA: Diagnosis not present

## 2020-04-16 DIAGNOSIS — R2681 Unsteadiness on feet: Secondary | ICD-10-CM | POA: Diagnosis not present

## 2020-04-16 DIAGNOSIS — R278 Other lack of coordination: Secondary | ICD-10-CM | POA: Diagnosis not present

## 2020-04-16 DIAGNOSIS — M24561 Contracture, right knee: Secondary | ICD-10-CM | POA: Diagnosis not present

## 2020-04-16 DIAGNOSIS — I639 Cerebral infarction, unspecified: Secondary | ICD-10-CM | POA: Diagnosis not present

## 2020-04-16 DIAGNOSIS — R0602 Shortness of breath: Secondary | ICD-10-CM | POA: Diagnosis not present

## 2020-04-16 DIAGNOSIS — M5136 Other intervertebral disc degeneration, lumbar region: Secondary | ICD-10-CM | POA: Diagnosis not present

## 2020-04-18 DIAGNOSIS — R278 Other lack of coordination: Secondary | ICD-10-CM | POA: Diagnosis not present

## 2020-04-18 DIAGNOSIS — I639 Cerebral infarction, unspecified: Secondary | ICD-10-CM | POA: Diagnosis not present

## 2020-04-18 DIAGNOSIS — M24561 Contracture, right knee: Secondary | ICD-10-CM | POA: Diagnosis not present

## 2020-04-18 DIAGNOSIS — M5030 Other cervical disc degeneration, unspecified cervical region: Secondary | ICD-10-CM | POA: Diagnosis not present

## 2020-04-18 DIAGNOSIS — R2681 Unsteadiness on feet: Secondary | ICD-10-CM | POA: Diagnosis not present

## 2020-04-18 DIAGNOSIS — M5136 Other intervertebral disc degeneration, lumbar region: Secondary | ICD-10-CM | POA: Diagnosis not present

## 2020-04-18 DIAGNOSIS — L89890 Pressure ulcer of other site, unstageable: Secondary | ICD-10-CM | POA: Diagnosis not present

## 2020-04-18 DIAGNOSIS — R0602 Shortness of breath: Secondary | ICD-10-CM | POA: Diagnosis not present

## 2020-04-18 DIAGNOSIS — M24562 Contracture, left knee: Secondary | ICD-10-CM | POA: Diagnosis not present

## 2020-04-18 DIAGNOSIS — A419 Sepsis, unspecified organism: Secondary | ICD-10-CM | POA: Diagnosis not present

## 2020-04-20 DIAGNOSIS — M24562 Contracture, left knee: Secondary | ICD-10-CM | POA: Diagnosis not present

## 2020-04-20 DIAGNOSIS — L89890 Pressure ulcer of other site, unstageable: Secondary | ICD-10-CM | POA: Diagnosis not present

## 2020-04-20 DIAGNOSIS — M5136 Other intervertebral disc degeneration, lumbar region: Secondary | ICD-10-CM | POA: Diagnosis not present

## 2020-04-20 DIAGNOSIS — R278 Other lack of coordination: Secondary | ICD-10-CM | POA: Diagnosis not present

## 2020-04-20 DIAGNOSIS — R2681 Unsteadiness on feet: Secondary | ICD-10-CM | POA: Diagnosis not present

## 2020-04-20 DIAGNOSIS — M24561 Contracture, right knee: Secondary | ICD-10-CM | POA: Diagnosis not present

## 2020-04-20 DIAGNOSIS — M5030 Other cervical disc degeneration, unspecified cervical region: Secondary | ICD-10-CM | POA: Diagnosis not present

## 2020-04-20 DIAGNOSIS — I639 Cerebral infarction, unspecified: Secondary | ICD-10-CM | POA: Diagnosis not present

## 2020-04-20 DIAGNOSIS — R0602 Shortness of breath: Secondary | ICD-10-CM | POA: Diagnosis not present

## 2020-04-20 DIAGNOSIS — A419 Sepsis, unspecified organism: Secondary | ICD-10-CM | POA: Diagnosis not present

## 2020-04-21 DIAGNOSIS — M5030 Other cervical disc degeneration, unspecified cervical region: Secondary | ICD-10-CM | POA: Diagnosis not present

## 2020-04-21 DIAGNOSIS — R0602 Shortness of breath: Secondary | ICD-10-CM | POA: Diagnosis not present

## 2020-04-21 DIAGNOSIS — S91301D Unspecified open wound, right foot, subsequent encounter: Secondary | ICD-10-CM | POA: Diagnosis not present

## 2020-04-21 DIAGNOSIS — R278 Other lack of coordination: Secondary | ICD-10-CM | POA: Diagnosis not present

## 2020-04-21 DIAGNOSIS — A419 Sepsis, unspecified organism: Secondary | ICD-10-CM | POA: Diagnosis not present

## 2020-04-21 DIAGNOSIS — M24562 Contracture, left knee: Secondary | ICD-10-CM | POA: Diagnosis not present

## 2020-04-21 DIAGNOSIS — I639 Cerebral infarction, unspecified: Secondary | ICD-10-CM | POA: Diagnosis not present

## 2020-04-21 DIAGNOSIS — L89890 Pressure ulcer of other site, unstageable: Secondary | ICD-10-CM | POA: Diagnosis not present

## 2020-04-21 DIAGNOSIS — M5136 Other intervertebral disc degeneration, lumbar region: Secondary | ICD-10-CM | POA: Diagnosis not present

## 2020-04-21 DIAGNOSIS — R2681 Unsteadiness on feet: Secondary | ICD-10-CM | POA: Diagnosis not present

## 2020-04-21 DIAGNOSIS — M24561 Contracture, right knee: Secondary | ICD-10-CM | POA: Diagnosis not present

## 2020-04-22 DIAGNOSIS — R278 Other lack of coordination: Secondary | ICD-10-CM | POA: Diagnosis not present

## 2020-04-22 DIAGNOSIS — I639 Cerebral infarction, unspecified: Secondary | ICD-10-CM | POA: Diagnosis not present

## 2020-04-22 DIAGNOSIS — R0602 Shortness of breath: Secondary | ICD-10-CM | POA: Diagnosis not present

## 2020-04-22 DIAGNOSIS — R2681 Unsteadiness on feet: Secondary | ICD-10-CM | POA: Diagnosis not present

## 2020-04-22 DIAGNOSIS — M5136 Other intervertebral disc degeneration, lumbar region: Secondary | ICD-10-CM | POA: Diagnosis not present

## 2020-04-22 DIAGNOSIS — A419 Sepsis, unspecified organism: Secondary | ICD-10-CM | POA: Diagnosis not present

## 2020-04-22 DIAGNOSIS — M24561 Contracture, right knee: Secondary | ICD-10-CM | POA: Diagnosis not present

## 2020-04-22 DIAGNOSIS — L89890 Pressure ulcer of other site, unstageable: Secondary | ICD-10-CM | POA: Diagnosis not present

## 2020-04-22 DIAGNOSIS — M24562 Contracture, left knee: Secondary | ICD-10-CM | POA: Diagnosis not present

## 2020-04-22 DIAGNOSIS — M5030 Other cervical disc degeneration, unspecified cervical region: Secondary | ICD-10-CM | POA: Diagnosis not present

## 2020-04-23 DIAGNOSIS — R278 Other lack of coordination: Secondary | ICD-10-CM | POA: Diagnosis not present

## 2020-04-23 DIAGNOSIS — M24562 Contracture, left knee: Secondary | ICD-10-CM | POA: Diagnosis not present

## 2020-04-23 DIAGNOSIS — M24561 Contracture, right knee: Secondary | ICD-10-CM | POA: Diagnosis not present

## 2020-04-23 DIAGNOSIS — R0602 Shortness of breath: Secondary | ICD-10-CM | POA: Diagnosis not present

## 2020-04-23 DIAGNOSIS — I639 Cerebral infarction, unspecified: Secondary | ICD-10-CM | POA: Diagnosis not present

## 2020-04-23 DIAGNOSIS — A419 Sepsis, unspecified organism: Secondary | ICD-10-CM | POA: Diagnosis not present

## 2020-04-23 DIAGNOSIS — R2681 Unsteadiness on feet: Secondary | ICD-10-CM | POA: Diagnosis not present

## 2020-04-23 DIAGNOSIS — M5030 Other cervical disc degeneration, unspecified cervical region: Secondary | ICD-10-CM | POA: Diagnosis not present

## 2020-04-23 DIAGNOSIS — L89890 Pressure ulcer of other site, unstageable: Secondary | ICD-10-CM | POA: Diagnosis not present

## 2020-04-23 DIAGNOSIS — M5136 Other intervertebral disc degeneration, lumbar region: Secondary | ICD-10-CM | POA: Diagnosis not present

## 2020-04-24 DIAGNOSIS — N390 Urinary tract infection, site not specified: Secondary | ICD-10-CM | POA: Diagnosis not present

## 2020-04-24 DIAGNOSIS — R0602 Shortness of breath: Secondary | ICD-10-CM | POA: Diagnosis not present

## 2020-04-24 DIAGNOSIS — M4316 Spondylolisthesis, lumbar region: Secondary | ICD-10-CM | POA: Diagnosis not present

## 2020-04-24 DIAGNOSIS — R278 Other lack of coordination: Secondary | ICD-10-CM | POA: Diagnosis not present

## 2020-04-24 DIAGNOSIS — M24562 Contracture, left knee: Secondary | ICD-10-CM | POA: Diagnosis not present

## 2020-04-24 DIAGNOSIS — L89890 Pressure ulcer of other site, unstageable: Secondary | ICD-10-CM | POA: Diagnosis not present

## 2020-04-24 DIAGNOSIS — M24561 Contracture, right knee: Secondary | ICD-10-CM | POA: Diagnosis not present

## 2020-04-24 DIAGNOSIS — R2681 Unsteadiness on feet: Secondary | ICD-10-CM | POA: Diagnosis not present

## 2020-04-24 DIAGNOSIS — A419 Sepsis, unspecified organism: Secondary | ICD-10-CM | POA: Diagnosis not present

## 2020-04-24 DIAGNOSIS — I639 Cerebral infarction, unspecified: Secondary | ICD-10-CM | POA: Diagnosis not present

## 2020-04-24 DIAGNOSIS — M5030 Other cervical disc degeneration, unspecified cervical region: Secondary | ICD-10-CM | POA: Diagnosis not present

## 2020-04-24 DIAGNOSIS — I69354 Hemiplegia and hemiparesis following cerebral infarction affecting left non-dominant side: Secondary | ICD-10-CM | POA: Diagnosis not present

## 2020-04-24 DIAGNOSIS — M5136 Other intervertebral disc degeneration, lumbar region: Secondary | ICD-10-CM | POA: Diagnosis not present

## 2020-04-24 DIAGNOSIS — E230 Hypopituitarism: Secondary | ICD-10-CM | POA: Diagnosis not present

## 2020-04-25 DIAGNOSIS — M5136 Other intervertebral disc degeneration, lumbar region: Secondary | ICD-10-CM | POA: Diagnosis not present

## 2020-04-25 DIAGNOSIS — R2681 Unsteadiness on feet: Secondary | ICD-10-CM | POA: Diagnosis not present

## 2020-04-25 DIAGNOSIS — I639 Cerebral infarction, unspecified: Secondary | ICD-10-CM | POA: Diagnosis not present

## 2020-04-25 DIAGNOSIS — A419 Sepsis, unspecified organism: Secondary | ICD-10-CM | POA: Diagnosis not present

## 2020-04-25 DIAGNOSIS — M24561 Contracture, right knee: Secondary | ICD-10-CM | POA: Diagnosis not present

## 2020-04-25 DIAGNOSIS — R0602 Shortness of breath: Secondary | ICD-10-CM | POA: Diagnosis not present

## 2020-04-25 DIAGNOSIS — M24562 Contracture, left knee: Secondary | ICD-10-CM | POA: Diagnosis not present

## 2020-04-25 DIAGNOSIS — L89890 Pressure ulcer of other site, unstageable: Secondary | ICD-10-CM | POA: Diagnosis not present

## 2020-04-25 DIAGNOSIS — R278 Other lack of coordination: Secondary | ICD-10-CM | POA: Diagnosis not present

## 2020-04-25 DIAGNOSIS — M5030 Other cervical disc degeneration, unspecified cervical region: Secondary | ICD-10-CM | POA: Diagnosis not present

## 2020-04-27 DIAGNOSIS — A419 Sepsis, unspecified organism: Secondary | ICD-10-CM | POA: Diagnosis not present

## 2020-04-27 DIAGNOSIS — R278 Other lack of coordination: Secondary | ICD-10-CM | POA: Diagnosis not present

## 2020-04-27 DIAGNOSIS — M5030 Other cervical disc degeneration, unspecified cervical region: Secondary | ICD-10-CM | POA: Diagnosis not present

## 2020-04-27 DIAGNOSIS — I639 Cerebral infarction, unspecified: Secondary | ICD-10-CM | POA: Diagnosis not present

## 2020-04-27 DIAGNOSIS — M5136 Other intervertebral disc degeneration, lumbar region: Secondary | ICD-10-CM | POA: Diagnosis not present

## 2020-04-27 DIAGNOSIS — M24561 Contracture, right knee: Secondary | ICD-10-CM | POA: Diagnosis not present

## 2020-04-27 DIAGNOSIS — R0602 Shortness of breath: Secondary | ICD-10-CM | POA: Diagnosis not present

## 2020-04-27 DIAGNOSIS — R2681 Unsteadiness on feet: Secondary | ICD-10-CM | POA: Diagnosis not present

## 2020-04-27 DIAGNOSIS — L89890 Pressure ulcer of other site, unstageable: Secondary | ICD-10-CM | POA: Diagnosis not present

## 2020-04-27 DIAGNOSIS — M24562 Contracture, left knee: Secondary | ICD-10-CM | POA: Diagnosis not present

## 2020-04-28 DIAGNOSIS — I639 Cerebral infarction, unspecified: Secondary | ICD-10-CM | POA: Diagnosis not present

## 2020-04-28 DIAGNOSIS — A419 Sepsis, unspecified organism: Secondary | ICD-10-CM | POA: Diagnosis not present

## 2020-04-28 DIAGNOSIS — L89890 Pressure ulcer of other site, unstageable: Secondary | ICD-10-CM | POA: Diagnosis not present

## 2020-04-28 DIAGNOSIS — M24562 Contracture, left knee: Secondary | ICD-10-CM | POA: Diagnosis not present

## 2020-04-28 DIAGNOSIS — M5030 Other cervical disc degeneration, unspecified cervical region: Secondary | ICD-10-CM | POA: Diagnosis not present

## 2020-04-28 DIAGNOSIS — R0602 Shortness of breath: Secondary | ICD-10-CM | POA: Diagnosis not present

## 2020-04-28 DIAGNOSIS — R278 Other lack of coordination: Secondary | ICD-10-CM | POA: Diagnosis not present

## 2020-04-28 DIAGNOSIS — R2681 Unsteadiness on feet: Secondary | ICD-10-CM | POA: Diagnosis not present

## 2020-04-28 DIAGNOSIS — M5136 Other intervertebral disc degeneration, lumbar region: Secondary | ICD-10-CM | POA: Diagnosis not present

## 2020-04-28 DIAGNOSIS — M24561 Contracture, right knee: Secondary | ICD-10-CM | POA: Diagnosis not present

## 2020-04-29 DIAGNOSIS — A419 Sepsis, unspecified organism: Secondary | ICD-10-CM | POA: Diagnosis not present

## 2020-04-29 DIAGNOSIS — M24562 Contracture, left knee: Secondary | ICD-10-CM | POA: Diagnosis not present

## 2020-04-29 DIAGNOSIS — M5030 Other cervical disc degeneration, unspecified cervical region: Secondary | ICD-10-CM | POA: Diagnosis not present

## 2020-04-29 DIAGNOSIS — I639 Cerebral infarction, unspecified: Secondary | ICD-10-CM | POA: Diagnosis not present

## 2020-04-29 DIAGNOSIS — M5136 Other intervertebral disc degeneration, lumbar region: Secondary | ICD-10-CM | POA: Diagnosis not present

## 2020-04-29 DIAGNOSIS — R278 Other lack of coordination: Secondary | ICD-10-CM | POA: Diagnosis not present

## 2020-04-29 DIAGNOSIS — M24561 Contracture, right knee: Secondary | ICD-10-CM | POA: Diagnosis not present

## 2020-04-29 DIAGNOSIS — L89890 Pressure ulcer of other site, unstageable: Secondary | ICD-10-CM | POA: Diagnosis not present

## 2020-04-29 DIAGNOSIS — R2681 Unsteadiness on feet: Secondary | ICD-10-CM | POA: Diagnosis not present

## 2020-04-29 DIAGNOSIS — R0602 Shortness of breath: Secondary | ICD-10-CM | POA: Diagnosis not present

## 2020-04-30 DIAGNOSIS — M5136 Other intervertebral disc degeneration, lumbar region: Secondary | ICD-10-CM | POA: Diagnosis not present

## 2020-04-30 DIAGNOSIS — M24561 Contracture, right knee: Secondary | ICD-10-CM | POA: Diagnosis not present

## 2020-04-30 DIAGNOSIS — I639 Cerebral infarction, unspecified: Secondary | ICD-10-CM | POA: Diagnosis not present

## 2020-04-30 DIAGNOSIS — R2681 Unsteadiness on feet: Secondary | ICD-10-CM | POA: Diagnosis not present

## 2020-04-30 DIAGNOSIS — R278 Other lack of coordination: Secondary | ICD-10-CM | POA: Diagnosis not present

## 2020-04-30 DIAGNOSIS — L89890 Pressure ulcer of other site, unstageable: Secondary | ICD-10-CM | POA: Diagnosis not present

## 2020-04-30 DIAGNOSIS — M24562 Contracture, left knee: Secondary | ICD-10-CM | POA: Diagnosis not present

## 2020-04-30 DIAGNOSIS — A419 Sepsis, unspecified organism: Secondary | ICD-10-CM | POA: Diagnosis not present

## 2020-04-30 DIAGNOSIS — M5030 Other cervical disc degeneration, unspecified cervical region: Secondary | ICD-10-CM | POA: Diagnosis not present

## 2020-04-30 DIAGNOSIS — R0602 Shortness of breath: Secondary | ICD-10-CM | POA: Diagnosis not present

## 2020-05-01 DIAGNOSIS — M5136 Other intervertebral disc degeneration, lumbar region: Secondary | ICD-10-CM | POA: Diagnosis not present

## 2020-05-01 DIAGNOSIS — M24562 Contracture, left knee: Secondary | ICD-10-CM | POA: Diagnosis not present

## 2020-05-01 DIAGNOSIS — M5030 Other cervical disc degeneration, unspecified cervical region: Secondary | ICD-10-CM | POA: Diagnosis not present

## 2020-05-01 DIAGNOSIS — R2681 Unsteadiness on feet: Secondary | ICD-10-CM | POA: Diagnosis not present

## 2020-05-01 DIAGNOSIS — A419 Sepsis, unspecified organism: Secondary | ICD-10-CM | POA: Diagnosis not present

## 2020-05-01 DIAGNOSIS — L89890 Pressure ulcer of other site, unstageable: Secondary | ICD-10-CM | POA: Diagnosis not present

## 2020-05-01 DIAGNOSIS — R278 Other lack of coordination: Secondary | ICD-10-CM | POA: Diagnosis not present

## 2020-05-01 DIAGNOSIS — R0602 Shortness of breath: Secondary | ICD-10-CM | POA: Diagnosis not present

## 2020-05-01 DIAGNOSIS — M24561 Contracture, right knee: Secondary | ICD-10-CM | POA: Diagnosis not present

## 2020-05-01 DIAGNOSIS — I639 Cerebral infarction, unspecified: Secondary | ICD-10-CM | POA: Diagnosis not present

## 2020-05-02 DIAGNOSIS — R278 Other lack of coordination: Secondary | ICD-10-CM | POA: Diagnosis not present

## 2020-05-02 DIAGNOSIS — I639 Cerebral infarction, unspecified: Secondary | ICD-10-CM | POA: Diagnosis not present

## 2020-05-02 DIAGNOSIS — R0602 Shortness of breath: Secondary | ICD-10-CM | POA: Diagnosis not present

## 2020-05-02 DIAGNOSIS — M5136 Other intervertebral disc degeneration, lumbar region: Secondary | ICD-10-CM | POA: Diagnosis not present

## 2020-05-02 DIAGNOSIS — M24562 Contracture, left knee: Secondary | ICD-10-CM | POA: Diagnosis not present

## 2020-05-02 DIAGNOSIS — A419 Sepsis, unspecified organism: Secondary | ICD-10-CM | POA: Diagnosis not present

## 2020-05-02 DIAGNOSIS — M24561 Contracture, right knee: Secondary | ICD-10-CM | POA: Diagnosis not present

## 2020-05-02 DIAGNOSIS — R2681 Unsteadiness on feet: Secondary | ICD-10-CM | POA: Diagnosis not present

## 2020-05-02 DIAGNOSIS — L89890 Pressure ulcer of other site, unstageable: Secondary | ICD-10-CM | POA: Diagnosis not present

## 2020-05-02 DIAGNOSIS — M5030 Other cervical disc degeneration, unspecified cervical region: Secondary | ICD-10-CM | POA: Diagnosis not present

## 2020-05-04 DIAGNOSIS — M5030 Other cervical disc degeneration, unspecified cervical region: Secondary | ICD-10-CM | POA: Diagnosis not present

## 2020-05-04 DIAGNOSIS — L89890 Pressure ulcer of other site, unstageable: Secondary | ICD-10-CM | POA: Diagnosis not present

## 2020-05-04 DIAGNOSIS — R0602 Shortness of breath: Secondary | ICD-10-CM | POA: Diagnosis not present

## 2020-05-04 DIAGNOSIS — M24561 Contracture, right knee: Secondary | ICD-10-CM | POA: Diagnosis not present

## 2020-05-04 DIAGNOSIS — M5136 Other intervertebral disc degeneration, lumbar region: Secondary | ICD-10-CM | POA: Diagnosis not present

## 2020-05-04 DIAGNOSIS — R2681 Unsteadiness on feet: Secondary | ICD-10-CM | POA: Diagnosis not present

## 2020-05-04 DIAGNOSIS — R278 Other lack of coordination: Secondary | ICD-10-CM | POA: Diagnosis not present

## 2020-05-04 DIAGNOSIS — I639 Cerebral infarction, unspecified: Secondary | ICD-10-CM | POA: Diagnosis not present

## 2020-05-04 DIAGNOSIS — M24562 Contracture, left knee: Secondary | ICD-10-CM | POA: Diagnosis not present

## 2020-05-04 DIAGNOSIS — A419 Sepsis, unspecified organism: Secondary | ICD-10-CM | POA: Diagnosis not present

## 2020-05-05 DIAGNOSIS — M5136 Other intervertebral disc degeneration, lumbar region: Secondary | ICD-10-CM | POA: Diagnosis not present

## 2020-05-05 DIAGNOSIS — I639 Cerebral infarction, unspecified: Secondary | ICD-10-CM | POA: Diagnosis not present

## 2020-05-05 DIAGNOSIS — R2681 Unsteadiness on feet: Secondary | ICD-10-CM | POA: Diagnosis not present

## 2020-05-05 DIAGNOSIS — M24561 Contracture, right knee: Secondary | ICD-10-CM | POA: Diagnosis not present

## 2020-05-05 DIAGNOSIS — R278 Other lack of coordination: Secondary | ICD-10-CM | POA: Diagnosis not present

## 2020-05-05 DIAGNOSIS — R0602 Shortness of breath: Secondary | ICD-10-CM | POA: Diagnosis not present

## 2020-05-05 DIAGNOSIS — M5030 Other cervical disc degeneration, unspecified cervical region: Secondary | ICD-10-CM | POA: Diagnosis not present

## 2020-05-05 DIAGNOSIS — L89890 Pressure ulcer of other site, unstageable: Secondary | ICD-10-CM | POA: Diagnosis not present

## 2020-05-05 DIAGNOSIS — A419 Sepsis, unspecified organism: Secondary | ICD-10-CM | POA: Diagnosis not present

## 2020-05-05 DIAGNOSIS — M24562 Contracture, left knee: Secondary | ICD-10-CM | POA: Diagnosis not present

## 2020-05-06 DIAGNOSIS — M24562 Contracture, left knee: Secondary | ICD-10-CM | POA: Diagnosis not present

## 2020-05-06 DIAGNOSIS — M5136 Other intervertebral disc degeneration, lumbar region: Secondary | ICD-10-CM | POA: Diagnosis not present

## 2020-05-06 DIAGNOSIS — A419 Sepsis, unspecified organism: Secondary | ICD-10-CM | POA: Diagnosis not present

## 2020-05-06 DIAGNOSIS — M24561 Contracture, right knee: Secondary | ICD-10-CM | POA: Diagnosis not present

## 2020-05-06 DIAGNOSIS — R0602 Shortness of breath: Secondary | ICD-10-CM | POA: Diagnosis not present

## 2020-05-06 DIAGNOSIS — R2681 Unsteadiness on feet: Secondary | ICD-10-CM | POA: Diagnosis not present

## 2020-05-06 DIAGNOSIS — L89890 Pressure ulcer of other site, unstageable: Secondary | ICD-10-CM | POA: Diagnosis not present

## 2020-05-06 DIAGNOSIS — R278 Other lack of coordination: Secondary | ICD-10-CM | POA: Diagnosis not present

## 2020-05-06 DIAGNOSIS — M5030 Other cervical disc degeneration, unspecified cervical region: Secondary | ICD-10-CM | POA: Diagnosis not present

## 2020-05-06 DIAGNOSIS — I639 Cerebral infarction, unspecified: Secondary | ICD-10-CM | POA: Diagnosis not present

## 2020-05-07 DIAGNOSIS — M5030 Other cervical disc degeneration, unspecified cervical region: Secondary | ICD-10-CM | POA: Diagnosis not present

## 2020-05-07 DIAGNOSIS — R278 Other lack of coordination: Secondary | ICD-10-CM | POA: Diagnosis not present

## 2020-05-07 DIAGNOSIS — R2681 Unsteadiness on feet: Secondary | ICD-10-CM | POA: Diagnosis not present

## 2020-05-07 DIAGNOSIS — I639 Cerebral infarction, unspecified: Secondary | ICD-10-CM | POA: Diagnosis not present

## 2020-05-07 DIAGNOSIS — R0602 Shortness of breath: Secondary | ICD-10-CM | POA: Diagnosis not present

## 2020-05-07 DIAGNOSIS — A419 Sepsis, unspecified organism: Secondary | ICD-10-CM | POA: Diagnosis not present

## 2020-05-07 DIAGNOSIS — M5136 Other intervertebral disc degeneration, lumbar region: Secondary | ICD-10-CM | POA: Diagnosis not present

## 2020-05-07 DIAGNOSIS — M24562 Contracture, left knee: Secondary | ICD-10-CM | POA: Diagnosis not present

## 2020-05-07 DIAGNOSIS — M24561 Contracture, right knee: Secondary | ICD-10-CM | POA: Diagnosis not present

## 2020-05-07 DIAGNOSIS — L89890 Pressure ulcer of other site, unstageable: Secondary | ICD-10-CM | POA: Diagnosis not present

## 2020-05-08 DIAGNOSIS — L89890 Pressure ulcer of other site, unstageable: Secondary | ICD-10-CM | POA: Diagnosis not present

## 2020-05-08 DIAGNOSIS — A419 Sepsis, unspecified organism: Secondary | ICD-10-CM | POA: Diagnosis not present

## 2020-05-08 DIAGNOSIS — I639 Cerebral infarction, unspecified: Secondary | ICD-10-CM | POA: Diagnosis not present

## 2020-05-08 DIAGNOSIS — M5030 Other cervical disc degeneration, unspecified cervical region: Secondary | ICD-10-CM | POA: Diagnosis not present

## 2020-05-08 DIAGNOSIS — R0602 Shortness of breath: Secondary | ICD-10-CM | POA: Diagnosis not present

## 2020-05-08 DIAGNOSIS — R278 Other lack of coordination: Secondary | ICD-10-CM | POA: Diagnosis not present

## 2020-05-08 DIAGNOSIS — M5136 Other intervertebral disc degeneration, lumbar region: Secondary | ICD-10-CM | POA: Diagnosis not present

## 2020-05-08 DIAGNOSIS — R2681 Unsteadiness on feet: Secondary | ICD-10-CM | POA: Diagnosis not present

## 2020-05-08 DIAGNOSIS — M24562 Contracture, left knee: Secondary | ICD-10-CM | POA: Diagnosis not present

## 2020-05-08 DIAGNOSIS — M24561 Contracture, right knee: Secondary | ICD-10-CM | POA: Diagnosis not present

## 2020-05-10 DIAGNOSIS — M24562 Contracture, left knee: Secondary | ICD-10-CM | POA: Diagnosis not present

## 2020-05-10 DIAGNOSIS — M5030 Other cervical disc degeneration, unspecified cervical region: Secondary | ICD-10-CM | POA: Diagnosis not present

## 2020-05-10 DIAGNOSIS — R278 Other lack of coordination: Secondary | ICD-10-CM | POA: Diagnosis not present

## 2020-05-10 DIAGNOSIS — L89890 Pressure ulcer of other site, unstageable: Secondary | ICD-10-CM | POA: Diagnosis not present

## 2020-05-10 DIAGNOSIS — M24561 Contracture, right knee: Secondary | ICD-10-CM | POA: Diagnosis not present

## 2020-05-10 DIAGNOSIS — M5136 Other intervertebral disc degeneration, lumbar region: Secondary | ICD-10-CM | POA: Diagnosis not present

## 2020-05-10 DIAGNOSIS — R0602 Shortness of breath: Secondary | ICD-10-CM | POA: Diagnosis not present

## 2020-05-10 DIAGNOSIS — I639 Cerebral infarction, unspecified: Secondary | ICD-10-CM | POA: Diagnosis not present

## 2020-05-10 DIAGNOSIS — R2681 Unsteadiness on feet: Secondary | ICD-10-CM | POA: Diagnosis not present

## 2020-05-10 DIAGNOSIS — A419 Sepsis, unspecified organism: Secondary | ICD-10-CM | POA: Diagnosis not present

## 2020-05-12 DIAGNOSIS — M5030 Other cervical disc degeneration, unspecified cervical region: Secondary | ICD-10-CM | POA: Diagnosis not present

## 2020-05-12 DIAGNOSIS — M24561 Contracture, right knee: Secondary | ICD-10-CM | POA: Diagnosis not present

## 2020-05-12 DIAGNOSIS — A419 Sepsis, unspecified organism: Secondary | ICD-10-CM | POA: Diagnosis not present

## 2020-05-12 DIAGNOSIS — M24562 Contracture, left knee: Secondary | ICD-10-CM | POA: Diagnosis not present

## 2020-05-12 DIAGNOSIS — I639 Cerebral infarction, unspecified: Secondary | ICD-10-CM | POA: Diagnosis not present

## 2020-05-12 DIAGNOSIS — R0602 Shortness of breath: Secondary | ICD-10-CM | POA: Diagnosis not present

## 2020-05-12 DIAGNOSIS — R278 Other lack of coordination: Secondary | ICD-10-CM | POA: Diagnosis not present

## 2020-05-12 DIAGNOSIS — R2681 Unsteadiness on feet: Secondary | ICD-10-CM | POA: Diagnosis not present

## 2020-05-12 DIAGNOSIS — L89890 Pressure ulcer of other site, unstageable: Secondary | ICD-10-CM | POA: Diagnosis not present

## 2020-05-12 DIAGNOSIS — M5136 Other intervertebral disc degeneration, lumbar region: Secondary | ICD-10-CM | POA: Diagnosis not present

## 2020-05-13 DIAGNOSIS — A419 Sepsis, unspecified organism: Secondary | ICD-10-CM | POA: Diagnosis not present

## 2020-05-13 DIAGNOSIS — R0602 Shortness of breath: Secondary | ICD-10-CM | POA: Diagnosis not present

## 2020-05-13 DIAGNOSIS — M5136 Other intervertebral disc degeneration, lumbar region: Secondary | ICD-10-CM | POA: Diagnosis not present

## 2020-05-13 DIAGNOSIS — I639 Cerebral infarction, unspecified: Secondary | ICD-10-CM | POA: Diagnosis not present

## 2020-05-13 DIAGNOSIS — L89890 Pressure ulcer of other site, unstageable: Secondary | ICD-10-CM | POA: Diagnosis not present

## 2020-05-13 DIAGNOSIS — R278 Other lack of coordination: Secondary | ICD-10-CM | POA: Diagnosis not present

## 2020-05-13 DIAGNOSIS — M24562 Contracture, left knee: Secondary | ICD-10-CM | POA: Diagnosis not present

## 2020-05-13 DIAGNOSIS — M5030 Other cervical disc degeneration, unspecified cervical region: Secondary | ICD-10-CM | POA: Diagnosis not present

## 2020-05-13 DIAGNOSIS — M24561 Contracture, right knee: Secondary | ICD-10-CM | POA: Diagnosis not present

## 2020-05-13 DIAGNOSIS — R2681 Unsteadiness on feet: Secondary | ICD-10-CM | POA: Diagnosis not present

## 2020-05-14 DIAGNOSIS — M24561 Contracture, right knee: Secondary | ICD-10-CM | POA: Diagnosis not present

## 2020-05-14 DIAGNOSIS — R0602 Shortness of breath: Secondary | ICD-10-CM | POA: Diagnosis not present

## 2020-05-14 DIAGNOSIS — R0989 Other specified symptoms and signs involving the circulatory and respiratory systems: Secondary | ICD-10-CM | POA: Diagnosis not present

## 2020-05-14 DIAGNOSIS — R278 Other lack of coordination: Secondary | ICD-10-CM | POA: Diagnosis not present

## 2020-05-14 DIAGNOSIS — M5030 Other cervical disc degeneration, unspecified cervical region: Secondary | ICD-10-CM | POA: Diagnosis not present

## 2020-05-14 DIAGNOSIS — L89890 Pressure ulcer of other site, unstageable: Secondary | ICD-10-CM | POA: Diagnosis not present

## 2020-05-14 DIAGNOSIS — R2681 Unsteadiness on feet: Secondary | ICD-10-CM | POA: Diagnosis not present

## 2020-05-14 DIAGNOSIS — R059 Cough, unspecified: Secondary | ICD-10-CM | POA: Diagnosis not present

## 2020-05-14 DIAGNOSIS — M24562 Contracture, left knee: Secondary | ICD-10-CM | POA: Diagnosis not present

## 2020-05-14 DIAGNOSIS — R051 Acute cough: Secondary | ICD-10-CM | POA: Diagnosis not present

## 2020-05-14 DIAGNOSIS — A419 Sepsis, unspecified organism: Secondary | ICD-10-CM | POA: Diagnosis not present

## 2020-05-14 DIAGNOSIS — I639 Cerebral infarction, unspecified: Secondary | ICD-10-CM | POA: Diagnosis not present

## 2020-05-14 DIAGNOSIS — M5136 Other intervertebral disc degeneration, lumbar region: Secondary | ICD-10-CM | POA: Diagnosis not present

## 2020-05-15 DIAGNOSIS — R2681 Unsteadiness on feet: Secondary | ICD-10-CM | POA: Diagnosis not present

## 2020-05-15 DIAGNOSIS — L89890 Pressure ulcer of other site, unstageable: Secondary | ICD-10-CM | POA: Diagnosis not present

## 2020-05-15 DIAGNOSIS — M24561 Contracture, right knee: Secondary | ICD-10-CM | POA: Diagnosis not present

## 2020-05-15 DIAGNOSIS — M5136 Other intervertebral disc degeneration, lumbar region: Secondary | ICD-10-CM | POA: Diagnosis not present

## 2020-05-15 DIAGNOSIS — R278 Other lack of coordination: Secondary | ICD-10-CM | POA: Diagnosis not present

## 2020-05-15 DIAGNOSIS — M24562 Contracture, left knee: Secondary | ICD-10-CM | POA: Diagnosis not present

## 2020-05-15 DIAGNOSIS — A419 Sepsis, unspecified organism: Secondary | ICD-10-CM | POA: Diagnosis not present

## 2020-05-15 DIAGNOSIS — I639 Cerebral infarction, unspecified: Secondary | ICD-10-CM | POA: Diagnosis not present

## 2020-05-15 DIAGNOSIS — M5030 Other cervical disc degeneration, unspecified cervical region: Secondary | ICD-10-CM | POA: Diagnosis not present

## 2020-05-15 DIAGNOSIS — R0602 Shortness of breath: Secondary | ICD-10-CM | POA: Diagnosis not present

## 2020-05-18 DIAGNOSIS — M5136 Other intervertebral disc degeneration, lumbar region: Secondary | ICD-10-CM | POA: Diagnosis not present

## 2020-05-18 DIAGNOSIS — M24561 Contracture, right knee: Secondary | ICD-10-CM | POA: Diagnosis not present

## 2020-05-18 DIAGNOSIS — R2681 Unsteadiness on feet: Secondary | ICD-10-CM | POA: Diagnosis not present

## 2020-05-18 DIAGNOSIS — M5030 Other cervical disc degeneration, unspecified cervical region: Secondary | ICD-10-CM | POA: Diagnosis not present

## 2020-05-18 DIAGNOSIS — R0602 Shortness of breath: Secondary | ICD-10-CM | POA: Diagnosis not present

## 2020-05-18 DIAGNOSIS — I639 Cerebral infarction, unspecified: Secondary | ICD-10-CM | POA: Diagnosis not present

## 2020-05-18 DIAGNOSIS — L89890 Pressure ulcer of other site, unstageable: Secondary | ICD-10-CM | POA: Diagnosis not present

## 2020-05-18 DIAGNOSIS — A419 Sepsis, unspecified organism: Secondary | ICD-10-CM | POA: Diagnosis not present

## 2020-05-18 DIAGNOSIS — M24562 Contracture, left knee: Secondary | ICD-10-CM | POA: Diagnosis not present

## 2020-05-18 DIAGNOSIS — R278 Other lack of coordination: Secondary | ICD-10-CM | POA: Diagnosis not present

## 2020-05-19 DIAGNOSIS — A419 Sepsis, unspecified organism: Secondary | ICD-10-CM | POA: Diagnosis not present

## 2020-05-19 DIAGNOSIS — I639 Cerebral infarction, unspecified: Secondary | ICD-10-CM | POA: Diagnosis not present

## 2020-05-19 DIAGNOSIS — M24561 Contracture, right knee: Secondary | ICD-10-CM | POA: Diagnosis not present

## 2020-05-19 DIAGNOSIS — L89890 Pressure ulcer of other site, unstageable: Secondary | ICD-10-CM | POA: Diagnosis not present

## 2020-05-19 DIAGNOSIS — M5030 Other cervical disc degeneration, unspecified cervical region: Secondary | ICD-10-CM | POA: Diagnosis not present

## 2020-05-19 DIAGNOSIS — M24562 Contracture, left knee: Secondary | ICD-10-CM | POA: Diagnosis not present

## 2020-05-19 DIAGNOSIS — R2681 Unsteadiness on feet: Secondary | ICD-10-CM | POA: Diagnosis not present

## 2020-05-19 DIAGNOSIS — R278 Other lack of coordination: Secondary | ICD-10-CM | POA: Diagnosis not present

## 2020-05-19 DIAGNOSIS — R0602 Shortness of breath: Secondary | ICD-10-CM | POA: Diagnosis not present

## 2020-05-19 DIAGNOSIS — M5136 Other intervertebral disc degeneration, lumbar region: Secondary | ICD-10-CM | POA: Diagnosis not present

## 2020-05-20 DIAGNOSIS — M5136 Other intervertebral disc degeneration, lumbar region: Secondary | ICD-10-CM | POA: Diagnosis not present

## 2020-05-20 DIAGNOSIS — M5030 Other cervical disc degeneration, unspecified cervical region: Secondary | ICD-10-CM | POA: Diagnosis not present

## 2020-05-20 DIAGNOSIS — R0602 Shortness of breath: Secondary | ICD-10-CM | POA: Diagnosis not present

## 2020-05-20 DIAGNOSIS — R2681 Unsteadiness on feet: Secondary | ICD-10-CM | POA: Diagnosis not present

## 2020-05-20 DIAGNOSIS — M24561 Contracture, right knee: Secondary | ICD-10-CM | POA: Diagnosis not present

## 2020-05-20 DIAGNOSIS — I639 Cerebral infarction, unspecified: Secondary | ICD-10-CM | POA: Diagnosis not present

## 2020-05-20 DIAGNOSIS — M24562 Contracture, left knee: Secondary | ICD-10-CM | POA: Diagnosis not present

## 2020-05-20 DIAGNOSIS — R278 Other lack of coordination: Secondary | ICD-10-CM | POA: Diagnosis not present

## 2020-05-20 DIAGNOSIS — L89890 Pressure ulcer of other site, unstageable: Secondary | ICD-10-CM | POA: Diagnosis not present

## 2020-05-20 DIAGNOSIS — A419 Sepsis, unspecified organism: Secondary | ICD-10-CM | POA: Diagnosis not present

## 2020-05-21 DIAGNOSIS — M24562 Contracture, left knee: Secondary | ICD-10-CM | POA: Diagnosis not present

## 2020-05-21 DIAGNOSIS — A419 Sepsis, unspecified organism: Secondary | ICD-10-CM | POA: Diagnosis not present

## 2020-05-21 DIAGNOSIS — M24561 Contracture, right knee: Secondary | ICD-10-CM | POA: Diagnosis not present

## 2020-05-21 DIAGNOSIS — R0602 Shortness of breath: Secondary | ICD-10-CM | POA: Diagnosis not present

## 2020-05-21 DIAGNOSIS — R2681 Unsteadiness on feet: Secondary | ICD-10-CM | POA: Diagnosis not present

## 2020-05-21 DIAGNOSIS — M5030 Other cervical disc degeneration, unspecified cervical region: Secondary | ICD-10-CM | POA: Diagnosis not present

## 2020-05-21 DIAGNOSIS — R278 Other lack of coordination: Secondary | ICD-10-CM | POA: Diagnosis not present

## 2020-05-21 DIAGNOSIS — I639 Cerebral infarction, unspecified: Secondary | ICD-10-CM | POA: Diagnosis not present

## 2020-05-21 DIAGNOSIS — L89890 Pressure ulcer of other site, unstageable: Secondary | ICD-10-CM | POA: Diagnosis not present

## 2020-05-21 DIAGNOSIS — M5136 Other intervertebral disc degeneration, lumbar region: Secondary | ICD-10-CM | POA: Diagnosis not present

## 2020-05-22 DIAGNOSIS — I639 Cerebral infarction, unspecified: Secondary | ICD-10-CM | POA: Diagnosis not present

## 2020-05-22 DIAGNOSIS — R278 Other lack of coordination: Secondary | ICD-10-CM | POA: Diagnosis not present

## 2020-05-22 DIAGNOSIS — R0602 Shortness of breath: Secondary | ICD-10-CM | POA: Diagnosis not present

## 2020-05-22 DIAGNOSIS — L89890 Pressure ulcer of other site, unstageable: Secondary | ICD-10-CM | POA: Diagnosis not present

## 2020-05-22 DIAGNOSIS — M5136 Other intervertebral disc degeneration, lumbar region: Secondary | ICD-10-CM | POA: Diagnosis not present

## 2020-05-22 DIAGNOSIS — A419 Sepsis, unspecified organism: Secondary | ICD-10-CM | POA: Diagnosis not present

## 2020-05-22 DIAGNOSIS — M5030 Other cervical disc degeneration, unspecified cervical region: Secondary | ICD-10-CM | POA: Diagnosis not present

## 2020-05-22 DIAGNOSIS — M24561 Contracture, right knee: Secondary | ICD-10-CM | POA: Diagnosis not present

## 2020-05-22 DIAGNOSIS — R2681 Unsteadiness on feet: Secondary | ICD-10-CM | POA: Diagnosis not present

## 2020-05-22 DIAGNOSIS — M24562 Contracture, left knee: Secondary | ICD-10-CM | POA: Diagnosis not present

## 2020-05-25 DIAGNOSIS — M24561 Contracture, right knee: Secondary | ICD-10-CM | POA: Diagnosis not present

## 2020-05-25 DIAGNOSIS — M5030 Other cervical disc degeneration, unspecified cervical region: Secondary | ICD-10-CM | POA: Diagnosis not present

## 2020-05-25 DIAGNOSIS — L89890 Pressure ulcer of other site, unstageable: Secondary | ICD-10-CM | POA: Diagnosis not present

## 2020-05-25 DIAGNOSIS — R2681 Unsteadiness on feet: Secondary | ICD-10-CM | POA: Diagnosis not present

## 2020-05-25 DIAGNOSIS — R0602 Shortness of breath: Secondary | ICD-10-CM | POA: Diagnosis not present

## 2020-05-25 DIAGNOSIS — A419 Sepsis, unspecified organism: Secondary | ICD-10-CM | POA: Diagnosis not present

## 2020-05-25 DIAGNOSIS — M5136 Other intervertebral disc degeneration, lumbar region: Secondary | ICD-10-CM | POA: Diagnosis not present

## 2020-05-25 DIAGNOSIS — I639 Cerebral infarction, unspecified: Secondary | ICD-10-CM | POA: Diagnosis not present

## 2020-05-25 DIAGNOSIS — M24562 Contracture, left knee: Secondary | ICD-10-CM | POA: Diagnosis not present

## 2020-05-25 DIAGNOSIS — R278 Other lack of coordination: Secondary | ICD-10-CM | POA: Diagnosis not present

## 2020-05-26 DIAGNOSIS — M5030 Other cervical disc degeneration, unspecified cervical region: Secondary | ICD-10-CM | POA: Diagnosis not present

## 2020-05-26 DIAGNOSIS — L89890 Pressure ulcer of other site, unstageable: Secondary | ICD-10-CM | POA: Diagnosis not present

## 2020-05-26 DIAGNOSIS — M24562 Contracture, left knee: Secondary | ICD-10-CM | POA: Diagnosis not present

## 2020-05-26 DIAGNOSIS — R0602 Shortness of breath: Secondary | ICD-10-CM | POA: Diagnosis not present

## 2020-05-26 DIAGNOSIS — I639 Cerebral infarction, unspecified: Secondary | ICD-10-CM | POA: Diagnosis not present

## 2020-05-26 DIAGNOSIS — M5136 Other intervertebral disc degeneration, lumbar region: Secondary | ICD-10-CM | POA: Diagnosis not present

## 2020-05-26 DIAGNOSIS — R278 Other lack of coordination: Secondary | ICD-10-CM | POA: Diagnosis not present

## 2020-05-26 DIAGNOSIS — A419 Sepsis, unspecified organism: Secondary | ICD-10-CM | POA: Diagnosis not present

## 2020-05-26 DIAGNOSIS — R2681 Unsteadiness on feet: Secondary | ICD-10-CM | POA: Diagnosis not present

## 2020-05-26 DIAGNOSIS — M24561 Contracture, right knee: Secondary | ICD-10-CM | POA: Diagnosis not present

## 2020-05-27 DIAGNOSIS — M5030 Other cervical disc degeneration, unspecified cervical region: Secondary | ICD-10-CM | POA: Diagnosis not present

## 2020-05-27 DIAGNOSIS — L89890 Pressure ulcer of other site, unstageable: Secondary | ICD-10-CM | POA: Diagnosis not present

## 2020-05-27 DIAGNOSIS — M24562 Contracture, left knee: Secondary | ICD-10-CM | POA: Diagnosis not present

## 2020-05-27 DIAGNOSIS — A419 Sepsis, unspecified organism: Secondary | ICD-10-CM | POA: Diagnosis not present

## 2020-05-27 DIAGNOSIS — R278 Other lack of coordination: Secondary | ICD-10-CM | POA: Diagnosis not present

## 2020-05-27 DIAGNOSIS — M5136 Other intervertebral disc degeneration, lumbar region: Secondary | ICD-10-CM | POA: Diagnosis not present

## 2020-05-27 DIAGNOSIS — I639 Cerebral infarction, unspecified: Secondary | ICD-10-CM | POA: Diagnosis not present

## 2020-05-27 DIAGNOSIS — M24561 Contracture, right knee: Secondary | ICD-10-CM | POA: Diagnosis not present

## 2020-05-27 DIAGNOSIS — R2681 Unsteadiness on feet: Secondary | ICD-10-CM | POA: Diagnosis not present

## 2020-05-27 DIAGNOSIS — R0602 Shortness of breath: Secondary | ICD-10-CM | POA: Diagnosis not present

## 2020-05-28 DIAGNOSIS — I639 Cerebral infarction, unspecified: Secondary | ICD-10-CM | POA: Diagnosis not present

## 2020-05-28 DIAGNOSIS — M24561 Contracture, right knee: Secondary | ICD-10-CM | POA: Diagnosis not present

## 2020-05-28 DIAGNOSIS — R0602 Shortness of breath: Secondary | ICD-10-CM | POA: Diagnosis not present

## 2020-05-28 DIAGNOSIS — M5030 Other cervical disc degeneration, unspecified cervical region: Secondary | ICD-10-CM | POA: Diagnosis not present

## 2020-05-28 DIAGNOSIS — L89890 Pressure ulcer of other site, unstageable: Secondary | ICD-10-CM | POA: Diagnosis not present

## 2020-05-28 DIAGNOSIS — M24562 Contracture, left knee: Secondary | ICD-10-CM | POA: Diagnosis not present

## 2020-05-28 DIAGNOSIS — R2681 Unsteadiness on feet: Secondary | ICD-10-CM | POA: Diagnosis not present

## 2020-05-28 DIAGNOSIS — R278 Other lack of coordination: Secondary | ICD-10-CM | POA: Diagnosis not present

## 2020-05-28 DIAGNOSIS — M5136 Other intervertebral disc degeneration, lumbar region: Secondary | ICD-10-CM | POA: Diagnosis not present

## 2020-05-28 DIAGNOSIS — A419 Sepsis, unspecified organism: Secondary | ICD-10-CM | POA: Diagnosis not present

## 2020-06-01 DIAGNOSIS — I639 Cerebral infarction, unspecified: Secondary | ICD-10-CM | POA: Diagnosis not present

## 2020-06-01 DIAGNOSIS — M5136 Other intervertebral disc degeneration, lumbar region: Secondary | ICD-10-CM | POA: Diagnosis not present

## 2020-06-01 DIAGNOSIS — R278 Other lack of coordination: Secondary | ICD-10-CM | POA: Diagnosis not present

## 2020-06-01 DIAGNOSIS — R0602 Shortness of breath: Secondary | ICD-10-CM | POA: Diagnosis not present

## 2020-06-01 DIAGNOSIS — M24561 Contracture, right knee: Secondary | ICD-10-CM | POA: Diagnosis not present

## 2020-06-01 DIAGNOSIS — R2681 Unsteadiness on feet: Secondary | ICD-10-CM | POA: Diagnosis not present

## 2020-06-01 DIAGNOSIS — A419 Sepsis, unspecified organism: Secondary | ICD-10-CM | POA: Diagnosis not present

## 2020-06-01 DIAGNOSIS — L89890 Pressure ulcer of other site, unstageable: Secondary | ICD-10-CM | POA: Diagnosis not present

## 2020-06-01 DIAGNOSIS — M24562 Contracture, left knee: Secondary | ICD-10-CM | POA: Diagnosis not present

## 2020-06-01 DIAGNOSIS — M5030 Other cervical disc degeneration, unspecified cervical region: Secondary | ICD-10-CM | POA: Diagnosis not present

## 2020-06-02 DIAGNOSIS — A419 Sepsis, unspecified organism: Secondary | ICD-10-CM | POA: Diagnosis not present

## 2020-06-02 DIAGNOSIS — D869 Sarcoidosis, unspecified: Secondary | ICD-10-CM | POA: Diagnosis not present

## 2020-06-02 DIAGNOSIS — M24561 Contracture, right knee: Secondary | ICD-10-CM | POA: Diagnosis not present

## 2020-06-02 DIAGNOSIS — L89890 Pressure ulcer of other site, unstageable: Secondary | ICD-10-CM | POA: Diagnosis not present

## 2020-06-02 DIAGNOSIS — M24562 Contracture, left knee: Secondary | ICD-10-CM | POA: Diagnosis not present

## 2020-06-02 DIAGNOSIS — R2681 Unsteadiness on feet: Secondary | ICD-10-CM | POA: Diagnosis not present

## 2020-06-02 DIAGNOSIS — R0602 Shortness of breath: Secondary | ICD-10-CM | POA: Diagnosis not present

## 2020-06-02 DIAGNOSIS — I69320 Aphasia following cerebral infarction: Secondary | ICD-10-CM | POA: Diagnosis not present

## 2020-06-02 DIAGNOSIS — I639 Cerebral infarction, unspecified: Secondary | ICD-10-CM | POA: Diagnosis not present

## 2020-06-02 DIAGNOSIS — M5030 Other cervical disc degeneration, unspecified cervical region: Secondary | ICD-10-CM | POA: Diagnosis not present

## 2020-06-02 DIAGNOSIS — R278 Other lack of coordination: Secondary | ICD-10-CM | POA: Diagnosis not present

## 2020-06-02 DIAGNOSIS — M5136 Other intervertebral disc degeneration, lumbar region: Secondary | ICD-10-CM | POA: Diagnosis not present

## 2020-06-02 DIAGNOSIS — I69354 Hemiplegia and hemiparesis following cerebral infarction affecting left non-dominant side: Secondary | ICD-10-CM | POA: Diagnosis not present

## 2020-06-03 DIAGNOSIS — M5030 Other cervical disc degeneration, unspecified cervical region: Secondary | ICD-10-CM | POA: Diagnosis not present

## 2020-06-03 DIAGNOSIS — R278 Other lack of coordination: Secondary | ICD-10-CM | POA: Diagnosis not present

## 2020-06-03 DIAGNOSIS — M24562 Contracture, left knee: Secondary | ICD-10-CM | POA: Diagnosis not present

## 2020-06-03 DIAGNOSIS — L89890 Pressure ulcer of other site, unstageable: Secondary | ICD-10-CM | POA: Diagnosis not present

## 2020-06-03 DIAGNOSIS — M5136 Other intervertebral disc degeneration, lumbar region: Secondary | ICD-10-CM | POA: Diagnosis not present

## 2020-06-03 DIAGNOSIS — M24561 Contracture, right knee: Secondary | ICD-10-CM | POA: Diagnosis not present

## 2020-06-03 DIAGNOSIS — R0602 Shortness of breath: Secondary | ICD-10-CM | POA: Diagnosis not present

## 2020-06-03 DIAGNOSIS — I639 Cerebral infarction, unspecified: Secondary | ICD-10-CM | POA: Diagnosis not present

## 2020-06-03 DIAGNOSIS — R2681 Unsteadiness on feet: Secondary | ICD-10-CM | POA: Diagnosis not present

## 2020-06-03 DIAGNOSIS — A419 Sepsis, unspecified organism: Secondary | ICD-10-CM | POA: Diagnosis not present

## 2020-06-04 DIAGNOSIS — R2681 Unsteadiness on feet: Secondary | ICD-10-CM | POA: Diagnosis not present

## 2020-06-04 DIAGNOSIS — R278 Other lack of coordination: Secondary | ICD-10-CM | POA: Diagnosis not present

## 2020-06-04 DIAGNOSIS — L89890 Pressure ulcer of other site, unstageable: Secondary | ICD-10-CM | POA: Diagnosis not present

## 2020-06-04 DIAGNOSIS — R0602 Shortness of breath: Secondary | ICD-10-CM | POA: Diagnosis not present

## 2020-06-04 DIAGNOSIS — M24562 Contracture, left knee: Secondary | ICD-10-CM | POA: Diagnosis not present

## 2020-06-04 DIAGNOSIS — M24561 Contracture, right knee: Secondary | ICD-10-CM | POA: Diagnosis not present

## 2020-06-04 DIAGNOSIS — M5136 Other intervertebral disc degeneration, lumbar region: Secondary | ICD-10-CM | POA: Diagnosis not present

## 2020-06-04 DIAGNOSIS — A419 Sepsis, unspecified organism: Secondary | ICD-10-CM | POA: Diagnosis not present

## 2020-06-04 DIAGNOSIS — I639 Cerebral infarction, unspecified: Secondary | ICD-10-CM | POA: Diagnosis not present

## 2020-06-04 DIAGNOSIS — M5030 Other cervical disc degeneration, unspecified cervical region: Secondary | ICD-10-CM | POA: Diagnosis not present

## 2020-06-05 DIAGNOSIS — M24562 Contracture, left knee: Secondary | ICD-10-CM | POA: Diagnosis not present

## 2020-06-05 DIAGNOSIS — M5030 Other cervical disc degeneration, unspecified cervical region: Secondary | ICD-10-CM | POA: Diagnosis not present

## 2020-06-05 DIAGNOSIS — R278 Other lack of coordination: Secondary | ICD-10-CM | POA: Diagnosis not present

## 2020-06-05 DIAGNOSIS — L89890 Pressure ulcer of other site, unstageable: Secondary | ICD-10-CM | POA: Diagnosis not present

## 2020-06-05 DIAGNOSIS — M5136 Other intervertebral disc degeneration, lumbar region: Secondary | ICD-10-CM | POA: Diagnosis not present

## 2020-06-05 DIAGNOSIS — I639 Cerebral infarction, unspecified: Secondary | ICD-10-CM | POA: Diagnosis not present

## 2020-06-05 DIAGNOSIS — A419 Sepsis, unspecified organism: Secondary | ICD-10-CM | POA: Diagnosis not present

## 2020-06-05 DIAGNOSIS — M24561 Contracture, right knee: Secondary | ICD-10-CM | POA: Diagnosis not present

## 2020-06-05 DIAGNOSIS — R2681 Unsteadiness on feet: Secondary | ICD-10-CM | POA: Diagnosis not present

## 2020-06-05 DIAGNOSIS — R0602 Shortness of breath: Secondary | ICD-10-CM | POA: Diagnosis not present

## 2020-06-08 DIAGNOSIS — M24561 Contracture, right knee: Secondary | ICD-10-CM | POA: Diagnosis not present

## 2020-06-08 DIAGNOSIS — R0602 Shortness of breath: Secondary | ICD-10-CM | POA: Diagnosis not present

## 2020-06-08 DIAGNOSIS — A419 Sepsis, unspecified organism: Secondary | ICD-10-CM | POA: Diagnosis not present

## 2020-06-08 DIAGNOSIS — L89890 Pressure ulcer of other site, unstageable: Secondary | ICD-10-CM | POA: Diagnosis not present

## 2020-06-08 DIAGNOSIS — R278 Other lack of coordination: Secondary | ICD-10-CM | POA: Diagnosis not present

## 2020-06-08 DIAGNOSIS — I639 Cerebral infarction, unspecified: Secondary | ICD-10-CM | POA: Diagnosis not present

## 2020-06-08 DIAGNOSIS — R2681 Unsteadiness on feet: Secondary | ICD-10-CM | POA: Diagnosis not present

## 2020-06-08 DIAGNOSIS — M5030 Other cervical disc degeneration, unspecified cervical region: Secondary | ICD-10-CM | POA: Diagnosis not present

## 2020-06-08 DIAGNOSIS — M24562 Contracture, left knee: Secondary | ICD-10-CM | POA: Diagnosis not present

## 2020-06-08 DIAGNOSIS — M5136 Other intervertebral disc degeneration, lumbar region: Secondary | ICD-10-CM | POA: Diagnosis not present

## 2020-06-09 DIAGNOSIS — R0602 Shortness of breath: Secondary | ICD-10-CM | POA: Diagnosis not present

## 2020-06-09 DIAGNOSIS — M5136 Other intervertebral disc degeneration, lumbar region: Secondary | ICD-10-CM | POA: Diagnosis not present

## 2020-06-09 DIAGNOSIS — L89890 Pressure ulcer of other site, unstageable: Secondary | ICD-10-CM | POA: Diagnosis not present

## 2020-06-09 DIAGNOSIS — M24561 Contracture, right knee: Secondary | ICD-10-CM | POA: Diagnosis not present

## 2020-06-09 DIAGNOSIS — M24562 Contracture, left knee: Secondary | ICD-10-CM | POA: Diagnosis not present

## 2020-06-09 DIAGNOSIS — M5030 Other cervical disc degeneration, unspecified cervical region: Secondary | ICD-10-CM | POA: Diagnosis not present

## 2020-06-09 DIAGNOSIS — R278 Other lack of coordination: Secondary | ICD-10-CM | POA: Diagnosis not present

## 2020-06-09 DIAGNOSIS — I639 Cerebral infarction, unspecified: Secondary | ICD-10-CM | POA: Diagnosis not present

## 2020-06-09 DIAGNOSIS — R2681 Unsteadiness on feet: Secondary | ICD-10-CM | POA: Diagnosis not present

## 2020-06-09 DIAGNOSIS — A419 Sepsis, unspecified organism: Secondary | ICD-10-CM | POA: Diagnosis not present

## 2020-06-10 DIAGNOSIS — R2681 Unsteadiness on feet: Secondary | ICD-10-CM | POA: Diagnosis not present

## 2020-06-10 DIAGNOSIS — M5030 Other cervical disc degeneration, unspecified cervical region: Secondary | ICD-10-CM | POA: Diagnosis not present

## 2020-06-10 DIAGNOSIS — E230 Hypopituitarism: Secondary | ICD-10-CM | POA: Diagnosis not present

## 2020-06-10 DIAGNOSIS — M24562 Contracture, left knee: Secondary | ICD-10-CM | POA: Diagnosis not present

## 2020-06-10 DIAGNOSIS — L89890 Pressure ulcer of other site, unstageable: Secondary | ICD-10-CM | POA: Diagnosis not present

## 2020-06-10 DIAGNOSIS — R0602 Shortness of breath: Secondary | ICD-10-CM | POA: Diagnosis not present

## 2020-06-10 DIAGNOSIS — M79675 Pain in left toe(s): Secondary | ICD-10-CM | POA: Diagnosis not present

## 2020-06-10 DIAGNOSIS — A419 Sepsis, unspecified organism: Secondary | ICD-10-CM | POA: Diagnosis not present

## 2020-06-10 DIAGNOSIS — R278 Other lack of coordination: Secondary | ICD-10-CM | POA: Diagnosis not present

## 2020-06-10 DIAGNOSIS — M24561 Contracture, right knee: Secondary | ICD-10-CM | POA: Diagnosis not present

## 2020-06-10 DIAGNOSIS — M5136 Other intervertebral disc degeneration, lumbar region: Secondary | ICD-10-CM | POA: Diagnosis not present

## 2020-06-10 DIAGNOSIS — I639 Cerebral infarction, unspecified: Secondary | ICD-10-CM | POA: Diagnosis not present

## 2020-06-11 DIAGNOSIS — M24561 Contracture, right knee: Secondary | ICD-10-CM | POA: Diagnosis not present

## 2020-06-11 DIAGNOSIS — M24562 Contracture, left knee: Secondary | ICD-10-CM | POA: Diagnosis not present

## 2020-06-11 DIAGNOSIS — A419 Sepsis, unspecified organism: Secondary | ICD-10-CM | POA: Diagnosis not present

## 2020-06-11 DIAGNOSIS — R2681 Unsteadiness on feet: Secondary | ICD-10-CM | POA: Diagnosis not present

## 2020-06-11 DIAGNOSIS — R278 Other lack of coordination: Secondary | ICD-10-CM | POA: Diagnosis not present

## 2020-06-11 DIAGNOSIS — I639 Cerebral infarction, unspecified: Secondary | ICD-10-CM | POA: Diagnosis not present

## 2020-06-11 DIAGNOSIS — L89890 Pressure ulcer of other site, unstageable: Secondary | ICD-10-CM | POA: Diagnosis not present

## 2020-06-11 DIAGNOSIS — M5030 Other cervical disc degeneration, unspecified cervical region: Secondary | ICD-10-CM | POA: Diagnosis not present

## 2020-06-11 DIAGNOSIS — R0602 Shortness of breath: Secondary | ICD-10-CM | POA: Diagnosis not present

## 2020-06-11 DIAGNOSIS — M5136 Other intervertebral disc degeneration, lumbar region: Secondary | ICD-10-CM | POA: Diagnosis not present

## 2020-06-12 DIAGNOSIS — M5136 Other intervertebral disc degeneration, lumbar region: Secondary | ICD-10-CM | POA: Diagnosis not present

## 2020-06-12 DIAGNOSIS — I639 Cerebral infarction, unspecified: Secondary | ICD-10-CM | POA: Diagnosis not present

## 2020-06-12 DIAGNOSIS — L89890 Pressure ulcer of other site, unstageable: Secondary | ICD-10-CM | POA: Diagnosis not present

## 2020-06-12 DIAGNOSIS — R2681 Unsteadiness on feet: Secondary | ICD-10-CM | POA: Diagnosis not present

## 2020-06-12 DIAGNOSIS — M5030 Other cervical disc degeneration, unspecified cervical region: Secondary | ICD-10-CM | POA: Diagnosis not present

## 2020-06-12 DIAGNOSIS — A419 Sepsis, unspecified organism: Secondary | ICD-10-CM | POA: Diagnosis not present

## 2020-06-12 DIAGNOSIS — R0602 Shortness of breath: Secondary | ICD-10-CM | POA: Diagnosis not present

## 2020-06-12 DIAGNOSIS — M24561 Contracture, right knee: Secondary | ICD-10-CM | POA: Diagnosis not present

## 2020-06-12 DIAGNOSIS — M24562 Contracture, left knee: Secondary | ICD-10-CM | POA: Diagnosis not present

## 2020-06-12 DIAGNOSIS — R278 Other lack of coordination: Secondary | ICD-10-CM | POA: Diagnosis not present

## 2020-06-13 DIAGNOSIS — R7989 Other specified abnormal findings of blood chemistry: Secondary | ICD-10-CM | POA: Diagnosis not present

## 2020-06-15 DIAGNOSIS — M5030 Other cervical disc degeneration, unspecified cervical region: Secondary | ICD-10-CM | POA: Diagnosis not present

## 2020-06-15 DIAGNOSIS — M5136 Other intervertebral disc degeneration, lumbar region: Secondary | ICD-10-CM | POA: Diagnosis not present

## 2020-06-15 DIAGNOSIS — R2681 Unsteadiness on feet: Secondary | ICD-10-CM | POA: Diagnosis not present

## 2020-06-15 DIAGNOSIS — M24562 Contracture, left knee: Secondary | ICD-10-CM | POA: Diagnosis not present

## 2020-06-15 DIAGNOSIS — L89890 Pressure ulcer of other site, unstageable: Secondary | ICD-10-CM | POA: Diagnosis not present

## 2020-06-15 DIAGNOSIS — R0602 Shortness of breath: Secondary | ICD-10-CM | POA: Diagnosis not present

## 2020-06-15 DIAGNOSIS — I639 Cerebral infarction, unspecified: Secondary | ICD-10-CM | POA: Diagnosis not present

## 2020-06-15 DIAGNOSIS — A419 Sepsis, unspecified organism: Secondary | ICD-10-CM | POA: Diagnosis not present

## 2020-06-15 DIAGNOSIS — R278 Other lack of coordination: Secondary | ICD-10-CM | POA: Diagnosis not present

## 2020-06-15 DIAGNOSIS — M24561 Contracture, right knee: Secondary | ICD-10-CM | POA: Diagnosis not present

## 2020-06-16 DIAGNOSIS — M5030 Other cervical disc degeneration, unspecified cervical region: Secondary | ICD-10-CM | POA: Diagnosis not present

## 2020-06-16 DIAGNOSIS — R2681 Unsteadiness on feet: Secondary | ICD-10-CM | POA: Diagnosis not present

## 2020-06-16 DIAGNOSIS — M5136 Other intervertebral disc degeneration, lumbar region: Secondary | ICD-10-CM | POA: Diagnosis not present

## 2020-06-16 DIAGNOSIS — M24562 Contracture, left knee: Secondary | ICD-10-CM | POA: Diagnosis not present

## 2020-06-16 DIAGNOSIS — L89890 Pressure ulcer of other site, unstageable: Secondary | ICD-10-CM | POA: Diagnosis not present

## 2020-06-16 DIAGNOSIS — R0602 Shortness of breath: Secondary | ICD-10-CM | POA: Diagnosis not present

## 2020-06-16 DIAGNOSIS — A419 Sepsis, unspecified organism: Secondary | ICD-10-CM | POA: Diagnosis not present

## 2020-06-16 DIAGNOSIS — M24561 Contracture, right knee: Secondary | ICD-10-CM | POA: Diagnosis not present

## 2020-06-16 DIAGNOSIS — R278 Other lack of coordination: Secondary | ICD-10-CM | POA: Diagnosis not present

## 2020-06-16 DIAGNOSIS — I639 Cerebral infarction, unspecified: Secondary | ICD-10-CM | POA: Diagnosis not present

## 2020-06-17 DIAGNOSIS — A419 Sepsis, unspecified organism: Secondary | ICD-10-CM | POA: Diagnosis not present

## 2020-06-17 DIAGNOSIS — M5030 Other cervical disc degeneration, unspecified cervical region: Secondary | ICD-10-CM | POA: Diagnosis not present

## 2020-06-17 DIAGNOSIS — M24562 Contracture, left knee: Secondary | ICD-10-CM | POA: Diagnosis not present

## 2020-06-17 DIAGNOSIS — L89890 Pressure ulcer of other site, unstageable: Secondary | ICD-10-CM | POA: Diagnosis not present

## 2020-06-17 DIAGNOSIS — R278 Other lack of coordination: Secondary | ICD-10-CM | POA: Diagnosis not present

## 2020-06-17 DIAGNOSIS — M24561 Contracture, right knee: Secondary | ICD-10-CM | POA: Diagnosis not present

## 2020-06-17 DIAGNOSIS — I639 Cerebral infarction, unspecified: Secondary | ICD-10-CM | POA: Diagnosis not present

## 2020-06-17 DIAGNOSIS — R2681 Unsteadiness on feet: Secondary | ICD-10-CM | POA: Diagnosis not present

## 2020-06-17 DIAGNOSIS — R0602 Shortness of breath: Secondary | ICD-10-CM | POA: Diagnosis not present

## 2020-06-17 DIAGNOSIS — M5136 Other intervertebral disc degeneration, lumbar region: Secondary | ICD-10-CM | POA: Diagnosis not present

## 2020-06-18 DIAGNOSIS — M5136 Other intervertebral disc degeneration, lumbar region: Secondary | ICD-10-CM | POA: Diagnosis not present

## 2020-06-18 DIAGNOSIS — R2681 Unsteadiness on feet: Secondary | ICD-10-CM | POA: Diagnosis not present

## 2020-06-18 DIAGNOSIS — I639 Cerebral infarction, unspecified: Secondary | ICD-10-CM | POA: Diagnosis not present

## 2020-06-18 DIAGNOSIS — M24561 Contracture, right knee: Secondary | ICD-10-CM | POA: Diagnosis not present

## 2020-06-18 DIAGNOSIS — R278 Other lack of coordination: Secondary | ICD-10-CM | POA: Diagnosis not present

## 2020-06-18 DIAGNOSIS — L89890 Pressure ulcer of other site, unstageable: Secondary | ICD-10-CM | POA: Diagnosis not present

## 2020-06-18 DIAGNOSIS — A419 Sepsis, unspecified organism: Secondary | ICD-10-CM | POA: Diagnosis not present

## 2020-06-18 DIAGNOSIS — M24562 Contracture, left knee: Secondary | ICD-10-CM | POA: Diagnosis not present

## 2020-06-18 DIAGNOSIS — R0602 Shortness of breath: Secondary | ICD-10-CM | POA: Diagnosis not present

## 2020-06-18 DIAGNOSIS — M5030 Other cervical disc degeneration, unspecified cervical region: Secondary | ICD-10-CM | POA: Diagnosis not present

## 2020-06-19 DIAGNOSIS — R0602 Shortness of breath: Secondary | ICD-10-CM | POA: Diagnosis not present

## 2020-06-19 DIAGNOSIS — R278 Other lack of coordination: Secondary | ICD-10-CM | POA: Diagnosis not present

## 2020-06-19 DIAGNOSIS — L89890 Pressure ulcer of other site, unstageable: Secondary | ICD-10-CM | POA: Diagnosis not present

## 2020-06-19 DIAGNOSIS — M5030 Other cervical disc degeneration, unspecified cervical region: Secondary | ICD-10-CM | POA: Diagnosis not present

## 2020-06-19 DIAGNOSIS — I639 Cerebral infarction, unspecified: Secondary | ICD-10-CM | POA: Diagnosis not present

## 2020-06-19 DIAGNOSIS — M24561 Contracture, right knee: Secondary | ICD-10-CM | POA: Diagnosis not present

## 2020-06-19 DIAGNOSIS — M5136 Other intervertebral disc degeneration, lumbar region: Secondary | ICD-10-CM | POA: Diagnosis not present

## 2020-06-19 DIAGNOSIS — R2681 Unsteadiness on feet: Secondary | ICD-10-CM | POA: Diagnosis not present

## 2020-06-19 DIAGNOSIS — M24562 Contracture, left knee: Secondary | ICD-10-CM | POA: Diagnosis not present

## 2020-06-19 DIAGNOSIS — A419 Sepsis, unspecified organism: Secondary | ICD-10-CM | POA: Diagnosis not present

## 2020-06-22 DIAGNOSIS — R030 Elevated blood-pressure reading, without diagnosis of hypertension: Secondary | ICD-10-CM | POA: Diagnosis not present

## 2020-06-22 DIAGNOSIS — R2681 Unsteadiness on feet: Secondary | ICD-10-CM | POA: Diagnosis not present

## 2020-06-22 DIAGNOSIS — I639 Cerebral infarction, unspecified: Secondary | ICD-10-CM | POA: Diagnosis not present

## 2020-06-22 DIAGNOSIS — M24562 Contracture, left knee: Secondary | ICD-10-CM | POA: Diagnosis not present

## 2020-06-22 DIAGNOSIS — M79675 Pain in left toe(s): Secondary | ICD-10-CM | POA: Diagnosis not present

## 2020-06-22 DIAGNOSIS — M5136 Other intervertebral disc degeneration, lumbar region: Secondary | ICD-10-CM | POA: Diagnosis not present

## 2020-06-22 DIAGNOSIS — M24561 Contracture, right knee: Secondary | ICD-10-CM | POA: Diagnosis not present

## 2020-06-22 DIAGNOSIS — M5030 Other cervical disc degeneration, unspecified cervical region: Secondary | ICD-10-CM | POA: Diagnosis not present

## 2020-06-22 DIAGNOSIS — R0602 Shortness of breath: Secondary | ICD-10-CM | POA: Diagnosis not present

## 2020-06-22 DIAGNOSIS — A419 Sepsis, unspecified organism: Secondary | ICD-10-CM | POA: Diagnosis not present

## 2020-06-22 DIAGNOSIS — L89890 Pressure ulcer of other site, unstageable: Secondary | ICD-10-CM | POA: Diagnosis not present

## 2020-06-22 DIAGNOSIS — R278 Other lack of coordination: Secondary | ICD-10-CM | POA: Diagnosis not present

## 2020-06-23 DIAGNOSIS — M24561 Contracture, right knee: Secondary | ICD-10-CM | POA: Diagnosis not present

## 2020-06-23 DIAGNOSIS — M5136 Other intervertebral disc degeneration, lumbar region: Secondary | ICD-10-CM | POA: Diagnosis not present

## 2020-06-23 DIAGNOSIS — L89890 Pressure ulcer of other site, unstageable: Secondary | ICD-10-CM | POA: Diagnosis not present

## 2020-06-23 DIAGNOSIS — I639 Cerebral infarction, unspecified: Secondary | ICD-10-CM | POA: Diagnosis not present

## 2020-06-23 DIAGNOSIS — M24562 Contracture, left knee: Secondary | ICD-10-CM | POA: Diagnosis not present

## 2020-06-23 DIAGNOSIS — R2681 Unsteadiness on feet: Secondary | ICD-10-CM | POA: Diagnosis not present

## 2020-06-23 DIAGNOSIS — R0602 Shortness of breath: Secondary | ICD-10-CM | POA: Diagnosis not present

## 2020-06-23 DIAGNOSIS — M5030 Other cervical disc degeneration, unspecified cervical region: Secondary | ICD-10-CM | POA: Diagnosis not present

## 2020-06-23 DIAGNOSIS — R278 Other lack of coordination: Secondary | ICD-10-CM | POA: Diagnosis not present

## 2020-06-23 DIAGNOSIS — A419 Sepsis, unspecified organism: Secondary | ICD-10-CM | POA: Diagnosis not present

## 2020-06-25 DIAGNOSIS — R0602 Shortness of breath: Secondary | ICD-10-CM | POA: Diagnosis not present

## 2020-06-25 DIAGNOSIS — M24561 Contracture, right knee: Secondary | ICD-10-CM | POA: Diagnosis not present

## 2020-06-25 DIAGNOSIS — M24562 Contracture, left knee: Secondary | ICD-10-CM | POA: Diagnosis not present

## 2020-06-25 DIAGNOSIS — R2681 Unsteadiness on feet: Secondary | ICD-10-CM | POA: Diagnosis not present

## 2020-06-25 DIAGNOSIS — I639 Cerebral infarction, unspecified: Secondary | ICD-10-CM | POA: Diagnosis not present

## 2020-06-25 DIAGNOSIS — R278 Other lack of coordination: Secondary | ICD-10-CM | POA: Diagnosis not present

## 2020-06-25 DIAGNOSIS — A419 Sepsis, unspecified organism: Secondary | ICD-10-CM | POA: Diagnosis not present

## 2020-06-25 DIAGNOSIS — M5136 Other intervertebral disc degeneration, lumbar region: Secondary | ICD-10-CM | POA: Diagnosis not present

## 2020-06-25 DIAGNOSIS — L89890 Pressure ulcer of other site, unstageable: Secondary | ICD-10-CM | POA: Diagnosis not present

## 2020-06-25 DIAGNOSIS — M5030 Other cervical disc degeneration, unspecified cervical region: Secondary | ICD-10-CM | POA: Diagnosis not present

## 2020-06-26 DIAGNOSIS — M5030 Other cervical disc degeneration, unspecified cervical region: Secondary | ICD-10-CM | POA: Diagnosis not present

## 2020-06-26 DIAGNOSIS — M24562 Contracture, left knee: Secondary | ICD-10-CM | POA: Diagnosis not present

## 2020-06-26 DIAGNOSIS — L89890 Pressure ulcer of other site, unstageable: Secondary | ICD-10-CM | POA: Diagnosis not present

## 2020-06-26 DIAGNOSIS — M24561 Contracture, right knee: Secondary | ICD-10-CM | POA: Diagnosis not present

## 2020-06-26 DIAGNOSIS — M5136 Other intervertebral disc degeneration, lumbar region: Secondary | ICD-10-CM | POA: Diagnosis not present

## 2020-06-26 DIAGNOSIS — I639 Cerebral infarction, unspecified: Secondary | ICD-10-CM | POA: Diagnosis not present

## 2020-06-26 DIAGNOSIS — R2681 Unsteadiness on feet: Secondary | ICD-10-CM | POA: Diagnosis not present

## 2020-06-26 DIAGNOSIS — R278 Other lack of coordination: Secondary | ICD-10-CM | POA: Diagnosis not present

## 2020-06-26 DIAGNOSIS — R0602 Shortness of breath: Secondary | ICD-10-CM | POA: Diagnosis not present

## 2020-06-26 DIAGNOSIS — A419 Sepsis, unspecified organism: Secondary | ICD-10-CM | POA: Diagnosis not present

## 2020-06-27 DIAGNOSIS — M24562 Contracture, left knee: Secondary | ICD-10-CM | POA: Diagnosis not present

## 2020-06-27 DIAGNOSIS — I639 Cerebral infarction, unspecified: Secondary | ICD-10-CM | POA: Diagnosis not present

## 2020-06-27 DIAGNOSIS — A419 Sepsis, unspecified organism: Secondary | ICD-10-CM | POA: Diagnosis not present

## 2020-06-27 DIAGNOSIS — M5030 Other cervical disc degeneration, unspecified cervical region: Secondary | ICD-10-CM | POA: Diagnosis not present

## 2020-06-27 DIAGNOSIS — L89890 Pressure ulcer of other site, unstageable: Secondary | ICD-10-CM | POA: Diagnosis not present

## 2020-06-27 DIAGNOSIS — M24561 Contracture, right knee: Secondary | ICD-10-CM | POA: Diagnosis not present

## 2020-06-27 DIAGNOSIS — R278 Other lack of coordination: Secondary | ICD-10-CM | POA: Diagnosis not present

## 2020-06-27 DIAGNOSIS — M5136 Other intervertebral disc degeneration, lumbar region: Secondary | ICD-10-CM | POA: Diagnosis not present

## 2020-06-27 DIAGNOSIS — R0602 Shortness of breath: Secondary | ICD-10-CM | POA: Diagnosis not present

## 2020-06-27 DIAGNOSIS — R2681 Unsteadiness on feet: Secondary | ICD-10-CM | POA: Diagnosis not present

## 2020-06-30 DIAGNOSIS — R0602 Shortness of breath: Secondary | ICD-10-CM | POA: Diagnosis not present

## 2020-06-30 DIAGNOSIS — I739 Peripheral vascular disease, unspecified: Secondary | ICD-10-CM | POA: Diagnosis not present

## 2020-06-30 DIAGNOSIS — I639 Cerebral infarction, unspecified: Secondary | ICD-10-CM | POA: Diagnosis not present

## 2020-06-30 DIAGNOSIS — L603 Nail dystrophy: Secondary | ICD-10-CM | POA: Diagnosis not present

## 2020-06-30 DIAGNOSIS — R2681 Unsteadiness on feet: Secondary | ICD-10-CM | POA: Diagnosis not present

## 2020-06-30 DIAGNOSIS — R278 Other lack of coordination: Secondary | ICD-10-CM | POA: Diagnosis not present

## 2020-06-30 DIAGNOSIS — B351 Tinea unguium: Secondary | ICD-10-CM | POA: Diagnosis not present

## 2020-06-30 DIAGNOSIS — R030 Elevated blood-pressure reading, without diagnosis of hypertension: Secondary | ICD-10-CM | POA: Diagnosis not present

## 2020-06-30 DIAGNOSIS — M24562 Contracture, left knee: Secondary | ICD-10-CM | POA: Diagnosis not present

## 2020-06-30 DIAGNOSIS — M5030 Other cervical disc degeneration, unspecified cervical region: Secondary | ICD-10-CM | POA: Diagnosis not present

## 2020-06-30 DIAGNOSIS — A419 Sepsis, unspecified organism: Secondary | ICD-10-CM | POA: Diagnosis not present

## 2020-06-30 DIAGNOSIS — L89890 Pressure ulcer of other site, unstageable: Secondary | ICD-10-CM | POA: Diagnosis not present

## 2020-06-30 DIAGNOSIS — M5136 Other intervertebral disc degeneration, lumbar region: Secondary | ICD-10-CM | POA: Diagnosis not present

## 2020-06-30 DIAGNOSIS — M24561 Contracture, right knee: Secondary | ICD-10-CM | POA: Diagnosis not present

## 2020-07-01 DIAGNOSIS — R2681 Unsteadiness on feet: Secondary | ICD-10-CM | POA: Diagnosis not present

## 2020-07-01 DIAGNOSIS — I639 Cerebral infarction, unspecified: Secondary | ICD-10-CM | POA: Diagnosis not present

## 2020-07-01 DIAGNOSIS — L89890 Pressure ulcer of other site, unstageable: Secondary | ICD-10-CM | POA: Diagnosis not present

## 2020-07-01 DIAGNOSIS — M24562 Contracture, left knee: Secondary | ICD-10-CM | POA: Diagnosis not present

## 2020-07-01 DIAGNOSIS — R0602 Shortness of breath: Secondary | ICD-10-CM | POA: Diagnosis not present

## 2020-07-01 DIAGNOSIS — A419 Sepsis, unspecified organism: Secondary | ICD-10-CM | POA: Diagnosis not present

## 2020-07-01 DIAGNOSIS — M25562 Pain in left knee: Secondary | ICD-10-CM | POA: Diagnosis not present

## 2020-07-01 DIAGNOSIS — M5136 Other intervertebral disc degeneration, lumbar region: Secondary | ICD-10-CM | POA: Diagnosis not present

## 2020-07-01 DIAGNOSIS — M24561 Contracture, right knee: Secondary | ICD-10-CM | POA: Diagnosis not present

## 2020-07-01 DIAGNOSIS — M5030 Other cervical disc degeneration, unspecified cervical region: Secondary | ICD-10-CM | POA: Diagnosis not present

## 2020-07-01 DIAGNOSIS — R278 Other lack of coordination: Secondary | ICD-10-CM | POA: Diagnosis not present

## 2020-07-02 DIAGNOSIS — A419 Sepsis, unspecified organism: Secondary | ICD-10-CM | POA: Diagnosis not present

## 2020-07-02 DIAGNOSIS — L89890 Pressure ulcer of other site, unstageable: Secondary | ICD-10-CM | POA: Diagnosis not present

## 2020-07-02 DIAGNOSIS — R0602 Shortness of breath: Secondary | ICD-10-CM | POA: Diagnosis not present

## 2020-07-02 DIAGNOSIS — R278 Other lack of coordination: Secondary | ICD-10-CM | POA: Diagnosis not present

## 2020-07-02 DIAGNOSIS — M5030 Other cervical disc degeneration, unspecified cervical region: Secondary | ICD-10-CM | POA: Diagnosis not present

## 2020-07-02 DIAGNOSIS — M5136 Other intervertebral disc degeneration, lumbar region: Secondary | ICD-10-CM | POA: Diagnosis not present

## 2020-07-02 DIAGNOSIS — M24562 Contracture, left knee: Secondary | ICD-10-CM | POA: Diagnosis not present

## 2020-07-02 DIAGNOSIS — M24561 Contracture, right knee: Secondary | ICD-10-CM | POA: Diagnosis not present

## 2020-07-02 DIAGNOSIS — R2681 Unsteadiness on feet: Secondary | ICD-10-CM | POA: Diagnosis not present

## 2020-07-02 DIAGNOSIS — I639 Cerebral infarction, unspecified: Secondary | ICD-10-CM | POA: Diagnosis not present

## 2020-07-03 DIAGNOSIS — L89890 Pressure ulcer of other site, unstageable: Secondary | ICD-10-CM | POA: Diagnosis not present

## 2020-07-03 DIAGNOSIS — M5030 Other cervical disc degeneration, unspecified cervical region: Secondary | ICD-10-CM | POA: Diagnosis not present

## 2020-07-03 DIAGNOSIS — M25512 Pain in left shoulder: Secondary | ICD-10-CM | POA: Diagnosis not present

## 2020-07-03 DIAGNOSIS — R0602 Shortness of breath: Secondary | ICD-10-CM | POA: Diagnosis not present

## 2020-07-03 DIAGNOSIS — R278 Other lack of coordination: Secondary | ICD-10-CM | POA: Diagnosis not present

## 2020-07-03 DIAGNOSIS — M24561 Contracture, right knee: Secondary | ICD-10-CM | POA: Diagnosis not present

## 2020-07-03 DIAGNOSIS — M24562 Contracture, left knee: Secondary | ICD-10-CM | POA: Diagnosis not present

## 2020-07-03 DIAGNOSIS — M5136 Other intervertebral disc degeneration, lumbar region: Secondary | ICD-10-CM | POA: Diagnosis not present

## 2020-07-03 DIAGNOSIS — M25569 Pain in unspecified knee: Secondary | ICD-10-CM | POA: Diagnosis not present

## 2020-07-03 DIAGNOSIS — A419 Sepsis, unspecified organism: Secondary | ICD-10-CM | POA: Diagnosis not present

## 2020-07-03 DIAGNOSIS — R2681 Unsteadiness on feet: Secondary | ICD-10-CM | POA: Diagnosis not present

## 2020-07-03 DIAGNOSIS — I639 Cerebral infarction, unspecified: Secondary | ICD-10-CM | POA: Diagnosis not present

## 2020-07-09 DIAGNOSIS — D869 Sarcoidosis, unspecified: Secondary | ICD-10-CM | POA: Diagnosis not present

## 2020-07-09 DIAGNOSIS — I69320 Aphasia following cerebral infarction: Secondary | ICD-10-CM | POA: Diagnosis not present

## 2020-07-09 DIAGNOSIS — E230 Hypopituitarism: Secondary | ICD-10-CM | POA: Diagnosis not present

## 2020-08-11 DIAGNOSIS — R030 Elevated blood-pressure reading, without diagnosis of hypertension: Secondary | ICD-10-CM | POA: Diagnosis not present

## 2020-08-11 DIAGNOSIS — E230 Hypopituitarism: Secondary | ICD-10-CM | POA: Diagnosis not present

## 2020-08-11 DIAGNOSIS — I69354 Hemiplegia and hemiparesis following cerebral infarction affecting left non-dominant side: Secondary | ICD-10-CM | POA: Diagnosis not present

## 2020-08-18 DIAGNOSIS — J309 Allergic rhinitis, unspecified: Secondary | ICD-10-CM | POA: Diagnosis not present

## 2020-09-04 DIAGNOSIS — D860 Sarcoidosis of lung: Secondary | ICD-10-CM | POA: Diagnosis not present

## 2020-09-04 DIAGNOSIS — E559 Vitamin D deficiency, unspecified: Secondary | ICD-10-CM | POA: Diagnosis not present

## 2020-09-04 DIAGNOSIS — E782 Mixed hyperlipidemia: Secondary | ICD-10-CM | POA: Diagnosis not present

## 2020-09-04 DIAGNOSIS — G4733 Obstructive sleep apnea (adult) (pediatric): Secondary | ICD-10-CM | POA: Diagnosis not present

## 2020-09-04 DIAGNOSIS — M62838 Other muscle spasm: Secondary | ICD-10-CM | POA: Diagnosis not present

## 2020-09-04 DIAGNOSIS — Z7409 Other reduced mobility: Secondary | ICD-10-CM | POA: Diagnosis not present

## 2020-09-04 DIAGNOSIS — I82509 Chronic embolism and thrombosis of unspecified deep veins of unspecified lower extremity: Secondary | ICD-10-CM | POA: Diagnosis not present

## 2020-09-04 DIAGNOSIS — I69851 Hemiplegia and hemiparesis following other cerebrovascular disease affecting right dominant side: Secondary | ICD-10-CM | POA: Diagnosis not present

## 2020-09-04 DIAGNOSIS — J302 Other seasonal allergic rhinitis: Secondary | ICD-10-CM | POA: Diagnosis not present

## 2020-09-04 DIAGNOSIS — G894 Chronic pain syndrome: Secondary | ICD-10-CM | POA: Diagnosis not present

## 2020-09-04 DIAGNOSIS — M6281 Muscle weakness (generalized): Secondary | ICD-10-CM | POA: Diagnosis not present

## 2020-09-04 DIAGNOSIS — K219 Gastro-esophageal reflux disease without esophagitis: Secondary | ICD-10-CM | POA: Diagnosis not present

## 2020-09-11 DIAGNOSIS — I69851 Hemiplegia and hemiparesis following other cerebrovascular disease affecting right dominant side: Secondary | ICD-10-CM | POA: Diagnosis not present

## 2020-09-11 DIAGNOSIS — E782 Mixed hyperlipidemia: Secondary | ICD-10-CM | POA: Diagnosis not present

## 2020-09-11 DIAGNOSIS — E230 Hypopituitarism: Secondary | ICD-10-CM | POA: Diagnosis not present

## 2020-09-11 DIAGNOSIS — E221 Hyperprolactinemia: Secondary | ICD-10-CM | POA: Diagnosis not present

## 2020-09-11 DIAGNOSIS — D860 Sarcoidosis of lung: Secondary | ICD-10-CM | POA: Diagnosis not present

## 2020-09-11 DIAGNOSIS — G4733 Obstructive sleep apnea (adult) (pediatric): Secondary | ICD-10-CM | POA: Diagnosis not present

## 2020-09-11 DIAGNOSIS — E278 Other specified disorders of adrenal gland: Secondary | ICD-10-CM | POA: Diagnosis not present

## 2020-09-15 DIAGNOSIS — M5030 Other cervical disc degeneration, unspecified cervical region: Secondary | ICD-10-CM | POA: Diagnosis not present

## 2020-09-15 DIAGNOSIS — R0602 Shortness of breath: Secondary | ICD-10-CM | POA: Diagnosis not present

## 2020-09-15 DIAGNOSIS — R278 Other lack of coordination: Secondary | ICD-10-CM | POA: Diagnosis not present

## 2020-09-15 DIAGNOSIS — M24561 Contracture, right knee: Secondary | ICD-10-CM | POA: Diagnosis not present

## 2020-09-15 DIAGNOSIS — R2681 Unsteadiness on feet: Secondary | ICD-10-CM | POA: Diagnosis not present

## 2020-09-15 DIAGNOSIS — A419 Sepsis, unspecified organism: Secondary | ICD-10-CM | POA: Diagnosis not present

## 2020-09-15 DIAGNOSIS — L89890 Pressure ulcer of other site, unstageable: Secondary | ICD-10-CM | POA: Diagnosis not present

## 2020-09-15 DIAGNOSIS — I639 Cerebral infarction, unspecified: Secondary | ICD-10-CM | POA: Diagnosis not present

## 2020-09-15 DIAGNOSIS — G9341 Metabolic encephalopathy: Secondary | ICD-10-CM | POA: Diagnosis not present

## 2020-09-15 DIAGNOSIS — M6281 Muscle weakness (generalized): Secondary | ICD-10-CM | POA: Diagnosis not present

## 2020-09-15 DIAGNOSIS — M24562 Contracture, left knee: Secondary | ICD-10-CM | POA: Diagnosis not present

## 2020-09-15 DIAGNOSIS — M5136 Other intervertebral disc degeneration, lumbar region: Secondary | ICD-10-CM | POA: Diagnosis not present

## 2020-09-18 DIAGNOSIS — I639 Cerebral infarction, unspecified: Secondary | ICD-10-CM | POA: Diagnosis not present

## 2020-09-18 DIAGNOSIS — M5136 Other intervertebral disc degeneration, lumbar region: Secondary | ICD-10-CM | POA: Diagnosis not present

## 2020-09-18 DIAGNOSIS — L89890 Pressure ulcer of other site, unstageable: Secondary | ICD-10-CM | POA: Diagnosis not present

## 2020-09-18 DIAGNOSIS — M5030 Other cervical disc degeneration, unspecified cervical region: Secondary | ICD-10-CM | POA: Diagnosis not present

## 2020-09-18 DIAGNOSIS — G9341 Metabolic encephalopathy: Secondary | ICD-10-CM | POA: Diagnosis not present

## 2020-09-18 DIAGNOSIS — M24561 Contracture, right knee: Secondary | ICD-10-CM | POA: Diagnosis not present

## 2020-09-18 DIAGNOSIS — R278 Other lack of coordination: Secondary | ICD-10-CM | POA: Diagnosis not present

## 2020-09-18 DIAGNOSIS — R0602 Shortness of breath: Secondary | ICD-10-CM | POA: Diagnosis not present

## 2020-09-18 DIAGNOSIS — R2681 Unsteadiness on feet: Secondary | ICD-10-CM | POA: Diagnosis not present

## 2020-09-18 DIAGNOSIS — A419 Sepsis, unspecified organism: Secondary | ICD-10-CM | POA: Diagnosis not present

## 2020-09-18 DIAGNOSIS — M24562 Contracture, left knee: Secondary | ICD-10-CM | POA: Diagnosis not present

## 2020-09-18 DIAGNOSIS — M6281 Muscle weakness (generalized): Secondary | ICD-10-CM | POA: Diagnosis not present

## 2020-09-19 DIAGNOSIS — R278 Other lack of coordination: Secondary | ICD-10-CM | POA: Diagnosis not present

## 2020-09-19 DIAGNOSIS — R0602 Shortness of breath: Secondary | ICD-10-CM | POA: Diagnosis not present

## 2020-09-19 DIAGNOSIS — I639 Cerebral infarction, unspecified: Secondary | ICD-10-CM | POA: Diagnosis not present

## 2020-09-19 DIAGNOSIS — G9341 Metabolic encephalopathy: Secondary | ICD-10-CM | POA: Diagnosis not present

## 2020-09-19 DIAGNOSIS — M5136 Other intervertebral disc degeneration, lumbar region: Secondary | ICD-10-CM | POA: Diagnosis not present

## 2020-09-19 DIAGNOSIS — M24561 Contracture, right knee: Secondary | ICD-10-CM | POA: Diagnosis not present

## 2020-09-19 DIAGNOSIS — M24562 Contracture, left knee: Secondary | ICD-10-CM | POA: Diagnosis not present

## 2020-09-19 DIAGNOSIS — R2681 Unsteadiness on feet: Secondary | ICD-10-CM | POA: Diagnosis not present

## 2020-09-19 DIAGNOSIS — L89890 Pressure ulcer of other site, unstageable: Secondary | ICD-10-CM | POA: Diagnosis not present

## 2020-09-19 DIAGNOSIS — M5030 Other cervical disc degeneration, unspecified cervical region: Secondary | ICD-10-CM | POA: Diagnosis not present

## 2020-09-19 DIAGNOSIS — A419 Sepsis, unspecified organism: Secondary | ICD-10-CM | POA: Diagnosis not present

## 2020-09-19 DIAGNOSIS — M6281 Muscle weakness (generalized): Secondary | ICD-10-CM | POA: Diagnosis not present

## 2020-09-20 DIAGNOSIS — R2681 Unsteadiness on feet: Secondary | ICD-10-CM | POA: Diagnosis not present

## 2020-09-20 DIAGNOSIS — M24562 Contracture, left knee: Secondary | ICD-10-CM | POA: Diagnosis not present

## 2020-09-20 DIAGNOSIS — M6281 Muscle weakness (generalized): Secondary | ICD-10-CM | POA: Diagnosis not present

## 2020-09-20 DIAGNOSIS — A419 Sepsis, unspecified organism: Secondary | ICD-10-CM | POA: Diagnosis not present

## 2020-09-20 DIAGNOSIS — I639 Cerebral infarction, unspecified: Secondary | ICD-10-CM | POA: Diagnosis not present

## 2020-09-20 DIAGNOSIS — M5030 Other cervical disc degeneration, unspecified cervical region: Secondary | ICD-10-CM | POA: Diagnosis not present

## 2020-09-20 DIAGNOSIS — R278 Other lack of coordination: Secondary | ICD-10-CM | POA: Diagnosis not present

## 2020-09-20 DIAGNOSIS — M24561 Contracture, right knee: Secondary | ICD-10-CM | POA: Diagnosis not present

## 2020-09-20 DIAGNOSIS — M5136 Other intervertebral disc degeneration, lumbar region: Secondary | ICD-10-CM | POA: Diagnosis not present

## 2020-09-20 DIAGNOSIS — G9341 Metabolic encephalopathy: Secondary | ICD-10-CM | POA: Diagnosis not present

## 2020-09-20 DIAGNOSIS — R0602 Shortness of breath: Secondary | ICD-10-CM | POA: Diagnosis not present

## 2020-09-20 DIAGNOSIS — L89890 Pressure ulcer of other site, unstageable: Secondary | ICD-10-CM | POA: Diagnosis not present

## 2020-09-21 DIAGNOSIS — R278 Other lack of coordination: Secondary | ICD-10-CM | POA: Diagnosis not present

## 2020-09-21 DIAGNOSIS — G9341 Metabolic encephalopathy: Secondary | ICD-10-CM | POA: Diagnosis not present

## 2020-09-21 DIAGNOSIS — R0602 Shortness of breath: Secondary | ICD-10-CM | POA: Diagnosis not present

## 2020-09-21 DIAGNOSIS — M5136 Other intervertebral disc degeneration, lumbar region: Secondary | ICD-10-CM | POA: Diagnosis not present

## 2020-09-21 DIAGNOSIS — M5030 Other cervical disc degeneration, unspecified cervical region: Secondary | ICD-10-CM | POA: Diagnosis not present

## 2020-09-21 DIAGNOSIS — L89890 Pressure ulcer of other site, unstageable: Secondary | ICD-10-CM | POA: Diagnosis not present

## 2020-09-21 DIAGNOSIS — M24562 Contracture, left knee: Secondary | ICD-10-CM | POA: Diagnosis not present

## 2020-09-21 DIAGNOSIS — M6281 Muscle weakness (generalized): Secondary | ICD-10-CM | POA: Diagnosis not present

## 2020-09-21 DIAGNOSIS — R2681 Unsteadiness on feet: Secondary | ICD-10-CM | POA: Diagnosis not present

## 2020-09-21 DIAGNOSIS — M24561 Contracture, right knee: Secondary | ICD-10-CM | POA: Diagnosis not present

## 2020-09-21 DIAGNOSIS — A419 Sepsis, unspecified organism: Secondary | ICD-10-CM | POA: Diagnosis not present

## 2020-09-21 DIAGNOSIS — I639 Cerebral infarction, unspecified: Secondary | ICD-10-CM | POA: Diagnosis not present

## 2020-09-22 DIAGNOSIS — M24562 Contracture, left knee: Secondary | ICD-10-CM | POA: Diagnosis not present

## 2020-09-22 DIAGNOSIS — A419 Sepsis, unspecified organism: Secondary | ICD-10-CM | POA: Diagnosis not present

## 2020-09-22 DIAGNOSIS — M6281 Muscle weakness (generalized): Secondary | ICD-10-CM | POA: Diagnosis not present

## 2020-09-22 DIAGNOSIS — G9341 Metabolic encephalopathy: Secondary | ICD-10-CM | POA: Diagnosis not present

## 2020-09-22 DIAGNOSIS — M5030 Other cervical disc degeneration, unspecified cervical region: Secondary | ICD-10-CM | POA: Diagnosis not present

## 2020-09-22 DIAGNOSIS — R2681 Unsteadiness on feet: Secondary | ICD-10-CM | POA: Diagnosis not present

## 2020-09-22 DIAGNOSIS — M5136 Other intervertebral disc degeneration, lumbar region: Secondary | ICD-10-CM | POA: Diagnosis not present

## 2020-09-22 DIAGNOSIS — I639 Cerebral infarction, unspecified: Secondary | ICD-10-CM | POA: Diagnosis not present

## 2020-09-22 DIAGNOSIS — R278 Other lack of coordination: Secondary | ICD-10-CM | POA: Diagnosis not present

## 2020-09-22 DIAGNOSIS — R0602 Shortness of breath: Secondary | ICD-10-CM | POA: Diagnosis not present

## 2020-09-22 DIAGNOSIS — M24561 Contracture, right knee: Secondary | ICD-10-CM | POA: Diagnosis not present

## 2020-09-22 DIAGNOSIS — L89890 Pressure ulcer of other site, unstageable: Secondary | ICD-10-CM | POA: Diagnosis not present

## 2020-09-23 DIAGNOSIS — R278 Other lack of coordination: Secondary | ICD-10-CM | POA: Diagnosis not present

## 2020-09-23 DIAGNOSIS — M6281 Muscle weakness (generalized): Secondary | ICD-10-CM | POA: Diagnosis not present

## 2020-09-23 DIAGNOSIS — L89890 Pressure ulcer of other site, unstageable: Secondary | ICD-10-CM | POA: Diagnosis not present

## 2020-09-23 DIAGNOSIS — M24562 Contracture, left knee: Secondary | ICD-10-CM | POA: Diagnosis not present

## 2020-09-23 DIAGNOSIS — M5030 Other cervical disc degeneration, unspecified cervical region: Secondary | ICD-10-CM | POA: Diagnosis not present

## 2020-09-23 DIAGNOSIS — R2681 Unsteadiness on feet: Secondary | ICD-10-CM | POA: Diagnosis not present

## 2020-09-23 DIAGNOSIS — A419 Sepsis, unspecified organism: Secondary | ICD-10-CM | POA: Diagnosis not present

## 2020-09-23 DIAGNOSIS — I639 Cerebral infarction, unspecified: Secondary | ICD-10-CM | POA: Diagnosis not present

## 2020-09-23 DIAGNOSIS — R0602 Shortness of breath: Secondary | ICD-10-CM | POA: Diagnosis not present

## 2020-09-23 DIAGNOSIS — M5136 Other intervertebral disc degeneration, lumbar region: Secondary | ICD-10-CM | POA: Diagnosis not present

## 2020-09-23 DIAGNOSIS — G9341 Metabolic encephalopathy: Secondary | ICD-10-CM | POA: Diagnosis not present

## 2020-09-23 DIAGNOSIS — M24561 Contracture, right knee: Secondary | ICD-10-CM | POA: Diagnosis not present

## 2020-09-24 DIAGNOSIS — R2681 Unsteadiness on feet: Secondary | ICD-10-CM | POA: Diagnosis not present

## 2020-09-24 DIAGNOSIS — L89890 Pressure ulcer of other site, unstageable: Secondary | ICD-10-CM | POA: Diagnosis not present

## 2020-09-24 DIAGNOSIS — R0602 Shortness of breath: Secondary | ICD-10-CM | POA: Diagnosis not present

## 2020-09-24 DIAGNOSIS — I639 Cerebral infarction, unspecified: Secondary | ICD-10-CM | POA: Diagnosis not present

## 2020-09-24 DIAGNOSIS — G9341 Metabolic encephalopathy: Secondary | ICD-10-CM | POA: Diagnosis not present

## 2020-09-24 DIAGNOSIS — M24562 Contracture, left knee: Secondary | ICD-10-CM | POA: Diagnosis not present

## 2020-09-24 DIAGNOSIS — M24561 Contracture, right knee: Secondary | ICD-10-CM | POA: Diagnosis not present

## 2020-09-24 DIAGNOSIS — A419 Sepsis, unspecified organism: Secondary | ICD-10-CM | POA: Diagnosis not present

## 2020-09-24 DIAGNOSIS — M6281 Muscle weakness (generalized): Secondary | ICD-10-CM | POA: Diagnosis not present

## 2020-09-24 DIAGNOSIS — R278 Other lack of coordination: Secondary | ICD-10-CM | POA: Diagnosis not present

## 2020-09-24 DIAGNOSIS — M5136 Other intervertebral disc degeneration, lumbar region: Secondary | ICD-10-CM | POA: Diagnosis not present

## 2020-09-24 DIAGNOSIS — M5030 Other cervical disc degeneration, unspecified cervical region: Secondary | ICD-10-CM | POA: Diagnosis not present

## 2020-09-25 DIAGNOSIS — M5136 Other intervertebral disc degeneration, lumbar region: Secondary | ICD-10-CM | POA: Diagnosis not present

## 2020-09-25 DIAGNOSIS — M24562 Contracture, left knee: Secondary | ICD-10-CM | POA: Diagnosis not present

## 2020-09-25 DIAGNOSIS — L89890 Pressure ulcer of other site, unstageable: Secondary | ICD-10-CM | POA: Diagnosis not present

## 2020-09-25 DIAGNOSIS — I639 Cerebral infarction, unspecified: Secondary | ICD-10-CM | POA: Diagnosis not present

## 2020-09-25 DIAGNOSIS — R2681 Unsteadiness on feet: Secondary | ICD-10-CM | POA: Diagnosis not present

## 2020-09-25 DIAGNOSIS — R0602 Shortness of breath: Secondary | ICD-10-CM | POA: Diagnosis not present

## 2020-09-25 DIAGNOSIS — M5030 Other cervical disc degeneration, unspecified cervical region: Secondary | ICD-10-CM | POA: Diagnosis not present

## 2020-09-25 DIAGNOSIS — R278 Other lack of coordination: Secondary | ICD-10-CM | POA: Diagnosis not present

## 2020-09-25 DIAGNOSIS — M24561 Contracture, right knee: Secondary | ICD-10-CM | POA: Diagnosis not present

## 2020-09-25 DIAGNOSIS — G9341 Metabolic encephalopathy: Secondary | ICD-10-CM | POA: Diagnosis not present

## 2020-09-25 DIAGNOSIS — A419 Sepsis, unspecified organism: Secondary | ICD-10-CM | POA: Diagnosis not present

## 2020-09-25 DIAGNOSIS — M6281 Muscle weakness (generalized): Secondary | ICD-10-CM | POA: Diagnosis not present

## 2020-09-28 DIAGNOSIS — M5030 Other cervical disc degeneration, unspecified cervical region: Secondary | ICD-10-CM | POA: Diagnosis not present

## 2020-09-28 DIAGNOSIS — M6281 Muscle weakness (generalized): Secondary | ICD-10-CM | POA: Diagnosis not present

## 2020-09-28 DIAGNOSIS — R2681 Unsteadiness on feet: Secondary | ICD-10-CM | POA: Diagnosis not present

## 2020-09-28 DIAGNOSIS — M24562 Contracture, left knee: Secondary | ICD-10-CM | POA: Diagnosis not present

## 2020-09-28 DIAGNOSIS — L89890 Pressure ulcer of other site, unstageable: Secondary | ICD-10-CM | POA: Diagnosis not present

## 2020-09-28 DIAGNOSIS — I639 Cerebral infarction, unspecified: Secondary | ICD-10-CM | POA: Diagnosis not present

## 2020-09-28 DIAGNOSIS — R0602 Shortness of breath: Secondary | ICD-10-CM | POA: Diagnosis not present

## 2020-09-28 DIAGNOSIS — A419 Sepsis, unspecified organism: Secondary | ICD-10-CM | POA: Diagnosis not present

## 2020-09-28 DIAGNOSIS — R278 Other lack of coordination: Secondary | ICD-10-CM | POA: Diagnosis not present

## 2020-09-28 DIAGNOSIS — M24561 Contracture, right knee: Secondary | ICD-10-CM | POA: Diagnosis not present

## 2020-09-28 DIAGNOSIS — G9341 Metabolic encephalopathy: Secondary | ICD-10-CM | POA: Diagnosis not present

## 2020-09-28 DIAGNOSIS — M5136 Other intervertebral disc degeneration, lumbar region: Secondary | ICD-10-CM | POA: Diagnosis not present

## 2020-09-29 DIAGNOSIS — A419 Sepsis, unspecified organism: Secondary | ICD-10-CM | POA: Diagnosis not present

## 2020-09-29 DIAGNOSIS — R0602 Shortness of breath: Secondary | ICD-10-CM | POA: Diagnosis not present

## 2020-09-29 DIAGNOSIS — M5136 Other intervertebral disc degeneration, lumbar region: Secondary | ICD-10-CM | POA: Diagnosis not present

## 2020-09-29 DIAGNOSIS — L89890 Pressure ulcer of other site, unstageable: Secondary | ICD-10-CM | POA: Diagnosis not present

## 2020-09-29 DIAGNOSIS — M5030 Other cervical disc degeneration, unspecified cervical region: Secondary | ICD-10-CM | POA: Diagnosis not present

## 2020-09-29 DIAGNOSIS — M6281 Muscle weakness (generalized): Secondary | ICD-10-CM | POA: Diagnosis not present

## 2020-09-29 DIAGNOSIS — I639 Cerebral infarction, unspecified: Secondary | ICD-10-CM | POA: Diagnosis not present

## 2020-09-29 DIAGNOSIS — R278 Other lack of coordination: Secondary | ICD-10-CM | POA: Diagnosis not present

## 2020-09-29 DIAGNOSIS — M24561 Contracture, right knee: Secondary | ICD-10-CM | POA: Diagnosis not present

## 2020-09-29 DIAGNOSIS — M24562 Contracture, left knee: Secondary | ICD-10-CM | POA: Diagnosis not present

## 2020-09-29 DIAGNOSIS — G9341 Metabolic encephalopathy: Secondary | ICD-10-CM | POA: Diagnosis not present

## 2020-09-29 DIAGNOSIS — R2681 Unsteadiness on feet: Secondary | ICD-10-CM | POA: Diagnosis not present

## 2020-09-30 DIAGNOSIS — M5136 Other intervertebral disc degeneration, lumbar region: Secondary | ICD-10-CM | POA: Diagnosis not present

## 2020-09-30 DIAGNOSIS — M6281 Muscle weakness (generalized): Secondary | ICD-10-CM | POA: Diagnosis not present

## 2020-09-30 DIAGNOSIS — G9341 Metabolic encephalopathy: Secondary | ICD-10-CM | POA: Diagnosis not present

## 2020-09-30 DIAGNOSIS — M5030 Other cervical disc degeneration, unspecified cervical region: Secondary | ICD-10-CM | POA: Diagnosis not present

## 2020-09-30 DIAGNOSIS — A419 Sepsis, unspecified organism: Secondary | ICD-10-CM | POA: Diagnosis not present

## 2020-09-30 DIAGNOSIS — R278 Other lack of coordination: Secondary | ICD-10-CM | POA: Diagnosis not present

## 2020-09-30 DIAGNOSIS — R0602 Shortness of breath: Secondary | ICD-10-CM | POA: Diagnosis not present

## 2020-09-30 DIAGNOSIS — R2681 Unsteadiness on feet: Secondary | ICD-10-CM | POA: Diagnosis not present

## 2020-09-30 DIAGNOSIS — L89890 Pressure ulcer of other site, unstageable: Secondary | ICD-10-CM | POA: Diagnosis not present

## 2020-09-30 DIAGNOSIS — I639 Cerebral infarction, unspecified: Secondary | ICD-10-CM | POA: Diagnosis not present

## 2020-09-30 DIAGNOSIS — M24562 Contracture, left knee: Secondary | ICD-10-CM | POA: Diagnosis not present

## 2020-09-30 DIAGNOSIS — M24561 Contracture, right knee: Secondary | ICD-10-CM | POA: Diagnosis not present

## 2020-10-01 DIAGNOSIS — M6281 Muscle weakness (generalized): Secondary | ICD-10-CM | POA: Diagnosis not present

## 2020-10-01 DIAGNOSIS — M5030 Other cervical disc degeneration, unspecified cervical region: Secondary | ICD-10-CM | POA: Diagnosis not present

## 2020-10-01 DIAGNOSIS — M24561 Contracture, right knee: Secondary | ICD-10-CM | POA: Diagnosis not present

## 2020-10-01 DIAGNOSIS — R2681 Unsteadiness on feet: Secondary | ICD-10-CM | POA: Diagnosis not present

## 2020-10-01 DIAGNOSIS — L89890 Pressure ulcer of other site, unstageable: Secondary | ICD-10-CM | POA: Diagnosis not present

## 2020-10-01 DIAGNOSIS — G9341 Metabolic encephalopathy: Secondary | ICD-10-CM | POA: Diagnosis not present

## 2020-10-01 DIAGNOSIS — A419 Sepsis, unspecified organism: Secondary | ICD-10-CM | POA: Diagnosis not present

## 2020-10-01 DIAGNOSIS — M24562 Contracture, left knee: Secondary | ICD-10-CM | POA: Diagnosis not present

## 2020-10-01 DIAGNOSIS — I639 Cerebral infarction, unspecified: Secondary | ICD-10-CM | POA: Diagnosis not present

## 2020-10-01 DIAGNOSIS — R278 Other lack of coordination: Secondary | ICD-10-CM | POA: Diagnosis not present

## 2020-10-01 DIAGNOSIS — M5136 Other intervertebral disc degeneration, lumbar region: Secondary | ICD-10-CM | POA: Diagnosis not present

## 2020-10-01 DIAGNOSIS — R0602 Shortness of breath: Secondary | ICD-10-CM | POA: Diagnosis not present

## 2020-12-24 ENCOUNTER — Encounter: Payer: Self-pay | Admitting: Emergency Medicine

## 2020-12-24 ENCOUNTER — Other Ambulatory Visit: Payer: Self-pay

## 2020-12-24 ENCOUNTER — Emergency Department: Payer: Medicare Other

## 2020-12-24 ENCOUNTER — Emergency Department
Admission: EM | Admit: 2020-12-24 | Discharge: 2020-12-24 | Disposition: A | Discharge status: Home / Self Care | Payer: Medicare Other | Attending: Emergency Medicine | Admitting: Emergency Medicine

## 2020-12-24 DIAGNOSIS — E039 Hypothyroidism, unspecified: Secondary | ICD-10-CM | POA: Insufficient documentation

## 2020-12-24 DIAGNOSIS — R1084 Generalized abdominal pain: Secondary | ICD-10-CM | POA: Insufficient documentation

## 2020-12-24 DIAGNOSIS — R109 Unspecified abdominal pain: Secondary | ICD-10-CM | POA: Diagnosis present

## 2020-12-24 DIAGNOSIS — R112 Nausea with vomiting, unspecified: Secondary | ICD-10-CM | POA: Insufficient documentation

## 2020-12-24 DIAGNOSIS — Z79899 Other long term (current) drug therapy: Secondary | ICD-10-CM | POA: Insufficient documentation

## 2020-12-24 DIAGNOSIS — R531 Weakness: Secondary | ICD-10-CM | POA: Insufficient documentation

## 2020-12-24 DIAGNOSIS — R110 Nausea: Secondary | ICD-10-CM

## 2020-12-24 DIAGNOSIS — R000 Tachycardia, unspecified: Secondary | ICD-10-CM | POA: Insufficient documentation

## 2020-12-24 LAB — COMPREHENSIVE METABOLIC PANEL
ALT: 30 U/L (ref 0–44)
AST: 39 U/L (ref 15–41)
Albumin: 4.1 g/dL (ref 3.5–5.0)
Alkaline Phosphatase: 72 U/L (ref 38–126)
Anion gap: 10 (ref 5–15)
BUN: 16 mg/dL (ref 8–23)
CO2: 23 mmol/L (ref 22–32)
Calcium: 9.3 mg/dL (ref 8.9–10.3)
Chloride: 101 mmol/L (ref 98–111)
Creatinine, Ser: 0.99 mg/dL (ref 0.44–1.00)
GFR, Estimated: >60 mL/min (ref >60)
Glucose, Bld: 151 mg/dL — ABNORMAL HIGH (ref 70–99)
Potassium: 3.1 mmol/L — ABNORMAL LOW (ref 3.5–5.1)
Sodium: 134 mmol/L — ABNORMAL LOW (ref 135–145)
Total Bilirubin: 0.8 mg/dL (ref 0.3–1.2)
Total Protein: 6.8 g/dL (ref 6.5–8.1)

## 2020-12-24 LAB — CBC
HCT: 43.3 % (ref 36.0–46.0)
Hemoglobin: 14 g/dL (ref 12.0–15.0)
MCH: 28.7 pg (ref 26.0–34.0)
MCHC: 32.3 g/dL (ref 30.0–36.0)
MCV: 88.9 fL (ref 80.0–100.0)
Platelets: 362 10*3/uL (ref 150–400)
RBC: 4.87 MIL/uL (ref 3.87–5.11)
RDW: 14.9 % (ref 11.5–15.5)
WBC: 9 10*3/uL (ref 4.0–10.5)
nRBC: 0 % (ref 0.0–0.2)

## 2020-12-24 LAB — LIPASE, BLOOD: Lipase: 36 U/L (ref 11–51)

## 2020-12-24 MED ORDER — IOHEXOL 350 MG/ML SOLN
80.0000 mL | Freq: Once | INTRAVENOUS | Status: AC | PRN
  Administered 2020-12-24: 80 mL via INTRAVENOUS
  Filled 2020-12-24: qty 80

## 2020-12-24 MED ORDER — SIMETHICONE 80 MG PO CHEW: 80.0000 mg | CHEWABLE_TABLET | Freq: Four times a day (QID) | ORAL | 0 refills | Status: AC | PRN

## 2020-12-24 MED ORDER — METOCLOPRAMIDE HCL 10 MG PO TABS: 10.0000 mg | ORAL_TABLET | Freq: Four times a day (QID) | ORAL | 0 refills | Status: AC | PRN

## 2020-12-24 MED ORDER — ONDANSETRON 4 MG PO TBDP
4.0000 mg | ORAL_TABLET | Freq: Once | ORAL | Status: AC
  Administered 2020-12-24: 4 mg via ORAL
  Filled 2020-12-24: qty 1

## 2020-12-24 NOTE — ED Triage Notes (Signed)
Pt to ED via [facility] from [facility] c/o N/V 6hrs ago after dinner.  EMS states given antiemetic at facility, had an episode of spitting up in EMS, VSS.  Hx stroke with right side deficit and at facility for rehab.  Feeling nauseous at this time, denies pain, chest rise even and unlabored.

## 2020-12-24 NOTE — ED Notes (Signed)
Pt resting at present   awaiting for EMS  pt has not had any vomiting this am

## 2020-12-24 NOTE — ED Notes (Signed)
Spoke with husband  update given   also called [employer] update given

## 2020-12-24 NOTE — ED Notes (Signed)
Called [facility] they couldn't transport, [facility] said they couldn't , [facility] said they couldn't, husband said he couldn't.  Called [facility] they said they would help and transport [phone]

## 2020-12-24 NOTE — ED Notes (Signed)
NP from rehab facility called this nurse to give a report. Provider states pt had complaints of vomiting for few hours, she recommended the patient try some Zofran at the facility. Pt states she did have normal BM yesterday, no recent complaint of constipation. Facility called EMS prior to patient getting Zofran and against recommendations of NP.

## 2020-12-24 NOTE — ED Provider Notes (Signed)
[facility] Emergency Department Provider Note  ____________________________________________   Event Date/Time   First MD Initiated Contact with Patient 12/24/20 0732     (approximate)  I have reviewed the triage vital signs and the nursing notes.   HISTORY  Chief Complaint Nausea and Vomiting    HPI Morgan Carpenter is a 70 y.o. female with medical history as listed below which notably includes a prior CVA with some residual expressive aphasia and some right-sided weakness.  She presents from her nursing facility for evaluation of vomiting.  There was some confusion initially because it was reported that she only had 1 episode of vomiting at the facility, but the patient states that is not true.  She says she developed abdominal pain and nausea and vomiting which persisted for hours and that she had multiple episodes of vomiting including on the ambulance.  She said that she does not feel severely nauseated currently but she is still having abdominal pain.  She denies fever, sore throat, chest pain, shortness of breath, and dysuria.  Nothing in particular makes her pain better or worse.  She says that she has had a cholecystectomy in the past.     Past Medical History:  Diagnosis Date   Adrenal insufficiency (HCC)    DDD (degenerative disc disease), cervical    DDD (degenerative disc disease), lumbar    Hemiplegia (HCC)    Hypercholesteremia    Hyperprolactinemia (HCC)    Hypopituitarism (HCC)    Lymphocytic hypophysitis (HCC)    OSA (obstructive sleep apnea)    Patient is Jehovah's Witness    Recurrent deep vein thrombosis (DVT) (HCC)    Sarcoidosis    Stroke (HCC)    Thyroid disease    Vitamin D deficiency     Patient Active Problem List   Diagnosis Date Noted   Deep vein thrombosis (DVT) of proximal lower extremity (HCC)    Depression    Hypernatremia    Hypokalemia    Panhypopituitarism (HCC)    Acute metabolic encephalopathy    Acute  cystitis without hematuria    Hyperlipidemia    Pressure injury of skin 08/23/2019   Severe sepsis (HCC) 08/22/2019   Osteoarthritis of knee 08/05/2019   Transaminitis 08/05/2019   Weakness 08/05/2019   Cervical myelopathy (HCC) 08/05/2019   Complicated UTI (urinary tract infection) 08/01/2019   Hypothyroidism    Sarcoidosis    OSA (obstructive sleep apnea)     Past Surgical History:  Procedure Laterality Date   CHOLECYSTECTOMY     NOSE SURGERY      Prior to Admission medications   Medication Sig Start Date End Date Taking? Authorizing Provider  acetaminophen (TYLENOL) 500 MG tablet Take 2 tablets (1,000 mg total) by mouth every 8 (eight) hours. 08/05/19   Dhungel, Nishant, MD  atorvastatin (LIPITOR) 40 MG tablet Take 40 mg by mouth daily. 07/21/19   [provider]  FLUoxetine (PROZAC) 10 MG capsule Take 10 mg by mouth daily. 07/13/19   [provider]  gabapentin (NEURONTIN) 300 MG capsule Take 1 capsule (300 mg total) by mouth 2 (two) times daily. 09/10/19   [name], MD  hydrocortisone (CORTEF) 10 MG tablet Take 1 tablet (10 mg total) by mouth at bedtime. 09/04/19   [name], MD  hydrocortisone (CORTEF) 20 MG tablet Take 1 tablet (20 mg total) by mouth daily. 09/05/19   [name], MD  levothyroxine (SYNTHROID) 75 MCG tablet Take 1 tablet (75 mcg total) by mouth daily  before breakfast. 09/10/19   [name], MD  Multiple Vitamin (MULTIVITAMIN) capsule Take by mouth.    [provider]  nitrofurantoin, macrocrystal-monohydrate, (MACROBID) 100 MG capsule Take 1 capsule (100 mg total) by mouth at bedtime. 09/05/19   [name], MD  pantoprazole (PROTONIX) 40 MG tablet Take 1 tablet (40 mg total) by mouth daily. 08/10/19   [name], MD  rivaroxaban (XARELTO) 20 MG TABS tablet Take 1 tablet (20 mg total) by mouth daily. 08/10/19   [name], MD  tiZANidine (ZANAFLEX) 2 MG tablet Take 2 mg by mouth at bedtime as needed. 06/24/19   [provider]     Allergies Naprosyn [naproxen], Shellfish allergy, and Sulfa antibiotics  History reviewed. No pertinent family history.  Social History Social History   Tobacco Use   Smoking status: Never   Smokeless tobacco: Never  Substance Use Topics   Alcohol use: Never   Drug use: Never    Review of Systems Constitutional: No fever/chills Eyes: No visual changes. ENT: No sore throat. Cardiovascular: Denies chest pain. Respiratory: Denies shortness of breath. Gastrointestinal: Generalized abdominal pain with nausea and vomiting  genitourinary: Negative for dysuria. Musculoskeletal: Negative for neck pain.  Negative for back pain. Integumentary: Negative for rash. Neurological: Chronic aphasia and right-sided deficits status post CVA.  Negative for headaches, focal weakness or numbness.   ____________________________________________   PHYSICAL EXAM:  VITAL SIGNS: ED Triage Vitals  Enc Vitals Group     BP 12/24/20 0455 118/79     Pulse Rate 12/24/20 0455 (!) 104     Resp 12/24/20 0455 20     Temp 12/24/20 0455 98.5 F (36.9 C)     Temp Source 12/24/20 0455 Oral     SpO2 12/24/20 0455 94 %     Weight 12/24/20 0452 98.4 kg (217 lb)     Height 12/24/20 0452 1.626 m (5\' 4" )     Head Circumference --      Peak Flow --      Pain Score 12/24/20 0452 0     Pain Loc --      Pain Edu? --      Excl. in GC? --     Constitutional: Alert and oriented.  Eyes: Conjunctivae are normal.  Head: Atraumatic. Nose: No congestion/rhinnorhea. Mouth/Throat: Patient is wearing a mask. Neck: No stridor.  No meningeal signs.   Cardiovascular: Normal rate, regular rhythm. Good peripheral circulation. Respiratory: Normal respiratory effort.  No retractions. Gastrointestinal: Morbid obesity.  Soft with tenderness to palpation throughout the abdomen, nonlocalized but at least moderate in severity.  Evidence of dried emesis around her face and on close. Musculoskeletal: No lower extremity  tenderness nor edema. No gross deformities of extremities. Neurologic: Mild expressive aphasia that the patient indicates is normal for her.  Some mild chronic right-sided focal neurological deficits which she also says is normal.  Patient seems to understand everything I am saying and I do not believe there is any element of receptive aphasia. Skin:  Skin is warm, dry and intact. Psychiatric: Mood and affect are normal. Speech and behavior are normal.  ____________________________________________   LABS (all labs ordered are listed, but only abnormal results are displayed)  Labs Reviewed  COMPREHENSIVE METABOLIC PANEL - Abnormal; Notable for the following components:      Result Value   Sodium 134 (*)    Potassium 3.1 (*)    Glucose, Bld 151 (*)    All other components within normal limits  LIPASE, BLOOD  CBC   ____________________________________________   RADIOLOGY CT scan of the abdomen and pelvis is pending at the time of transfer of care.  ____________________________________________     INITIAL IMPRESSION / MDM / ASSESSMENT AND PLAN / ED COURSE  As part of my medical decision making, I reviewed the following data within the electronic MEDICAL RECORD NUMBER Nursing notes reviewed and incorporated, Labs reviewed , Old chart reviewed, Patient signed out to Dr. Lenard Lance, and reviewed Notes from prior ED visits   Differential diagnosis includes, but is not limited to, viral illness, SBO/ileus, nonspecific gastritis.  Patient has some mild tachycardia which could be volume related or just due to discomfort.  Vital signs otherwise stable.  She feels little bit better right now than she did earlier but she still has significant tenderness to palpation and reportedly has had multiple episodes of vomiting over the last 9 hours or so.  She has many comorbidities and precise localization of her discomfort is difficult due to body habitus.  CBC, lipase, and comprehensive metabolic  panel are essentially normal.  Patient has had as long as I am not pushing on her she is not hurting too much and she said she is no longer nauseated after getting medicine on the ambulance.  I will hold off on medication but obtain a CT scan of the abdomen and pelvis with IV contrast for further evaluation.      Clinical Course as of 12/24/20 8563  Thu Dec 24, 2020  0732 Transferring ED care to Dr. Lenard Lance [CF]    Clinical Course User Index [CF] Loleta Janaya, MD     ____________________________________________  FINAL CLINICAL IMPRESSION(S) / ED DIAGNOSES  Generalized abdominal pain Nausea and vomiting   MEDICATIONS GIVEN DURING THIS VISIT:  Medications  ondansetron (ZOFRAN-ODT) disintegrating tablet 4 mg (4 mg Oral Given 12/24/20 0502)  iohexol (OMNIPAQUE) 350 MG/ML injection 80 mL (80 mLs Intravenous Contrast Given 12/24/20 0735)     ED Discharge Orders     None        Note:  This document was prepared using Dragon voice recognition software and may include unintentional dictation errors.   Loleta Cheyna, MD 12/24/20 [phone]

## 2020-12-24 NOTE — ED Provider Notes (Signed)
-----------------------------------------   8:45 AM on 12/24/2020 ----------------------------------------- Patient CT scan shows likely ileus but no bowel obstruction.  Patient states he is feeling better just feeling tired.  Given the patient's reassuring work-up we will discharge with Reglan for nausea as well as simethicone.  Discussed return precautions.  Patient agreeable to plan of care.   Minna Antis, MD 12/24/20 [phone]

## 2021-01-18 IMAGING — MR MR LUMBAR SPINE WO/W CM
7 of 12 series · 29 of 48 positions shown · IV contrast (gadavist)
Comparison: None.

CLINICAL DATA: Inability to walk

EXAM:
MRI LUMBAR SPINE WITHOUT AND WITH CONTRAST
TECHNIQUE: Multiplanar and multiecho pulse sequences of the lumbar spine were
obtained without and with intravenous contrast.
CONTRAST:  8mL GADAVIST GADOBUTROL 1 MMOL/ML IV SOLN

[Series 5: T2 · sagittal · 4.0mm · 0.81mm/px · 2 of 17 slices shown (1 of 3)]
[im 1/17]
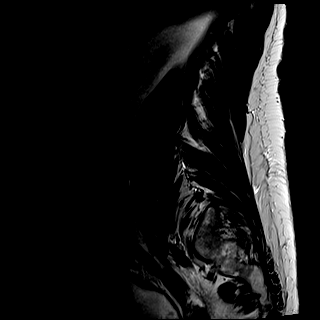
[im 17/17]
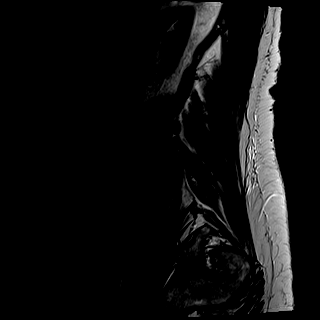

[Series 6: T1 · sagittal · 4.0mm · 0.81mm/px · 2 of 17 slices shown (1 of 4)]
[im 1/17]
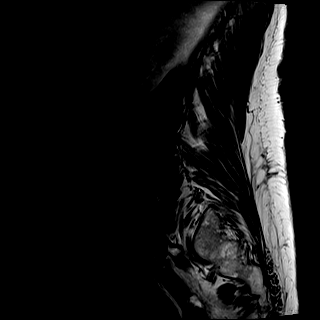
[im 17/17]
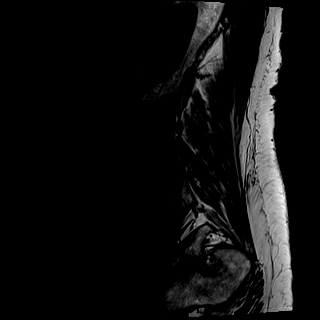

[Series 8: T2 · axial · 4.0mm · 0.78mm/px · z∈[-132,+74]mm · 6 of 38 slices shown (2 of 3)]
[im 1/38]
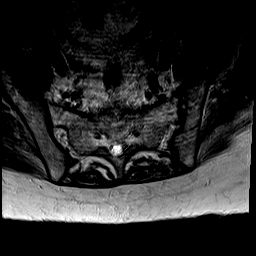
[im 8/38]
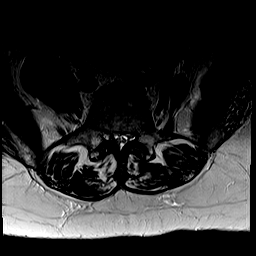
[im 15/38]
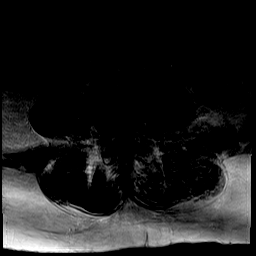
[im 23/38]
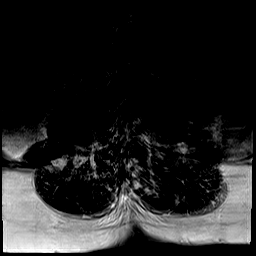
[im 30/38]
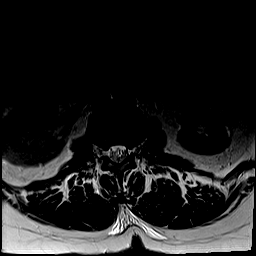
[im 38/38]
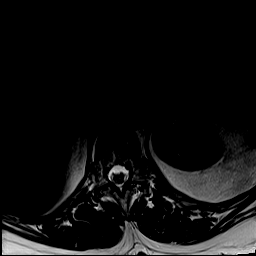

[Series 10: T1 · axial · 4.0mm · 0.39mm/px · z∈[-132,+74]mm · 6 of 38 slices shown (2 of 4)]
[im 1/38]
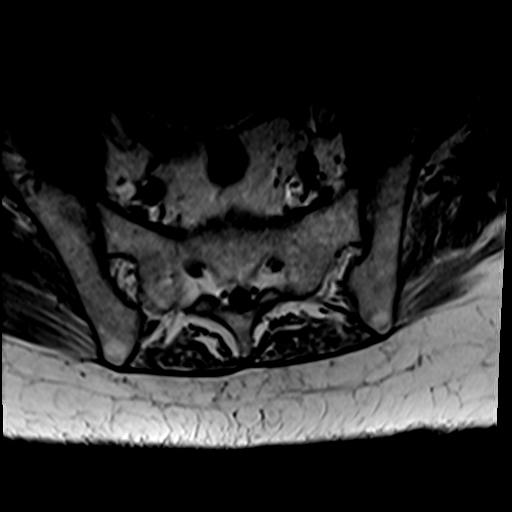
[im 8/38]
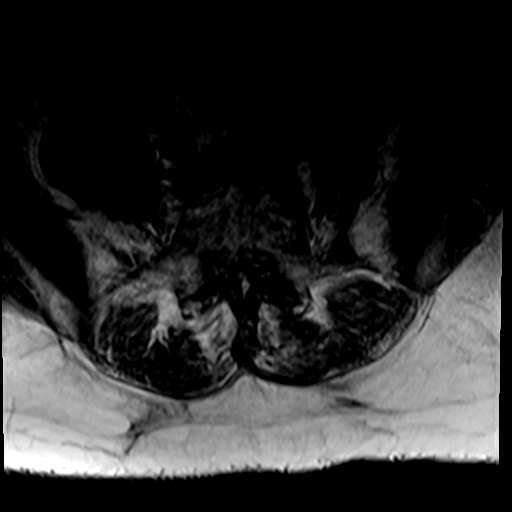
[im 15/38]
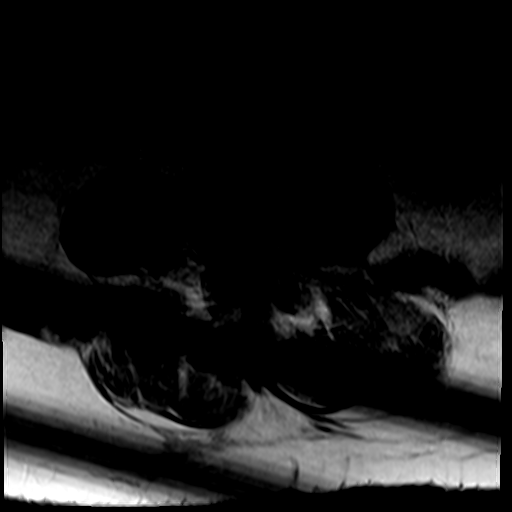
[im 23/38]
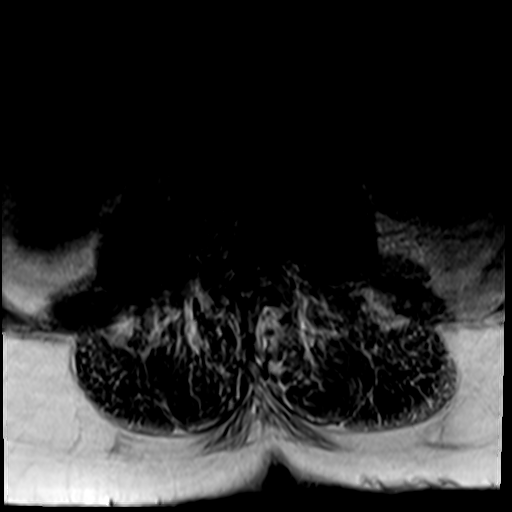
[im 30/38]
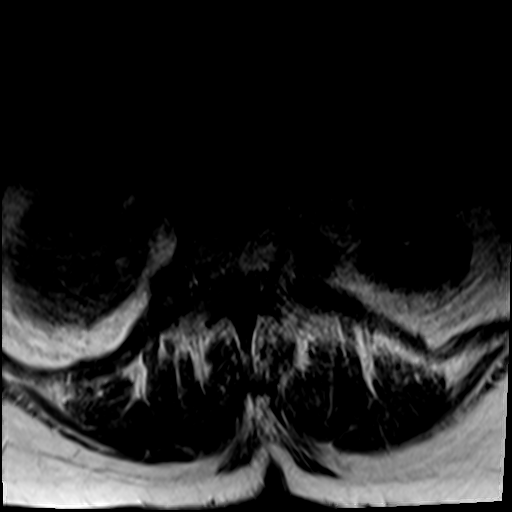
[im 38/38]
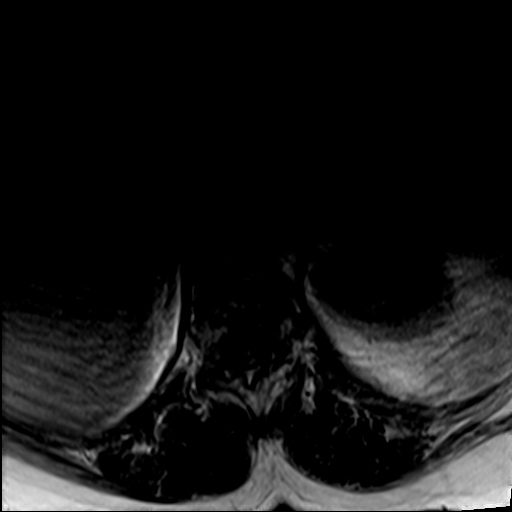

[Series 12: T2 · axial · 4.0mm · 0.78mm/px · z∈[-132,+74]mm · 6 of 38 slices shown (3 of 3)]
[im 1/38]
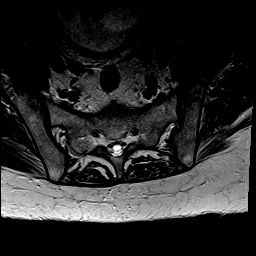
[im 8/38]
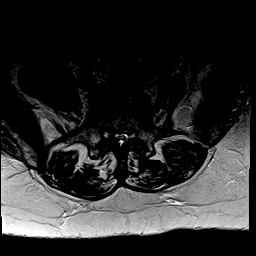
[im 15/38]
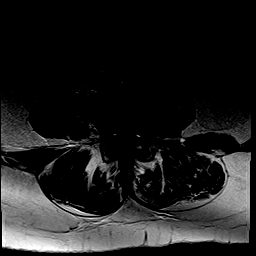
[im 23/38]
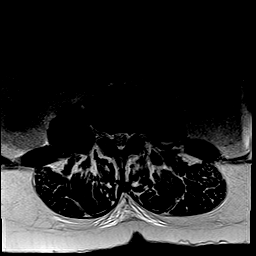
[im 30/38]
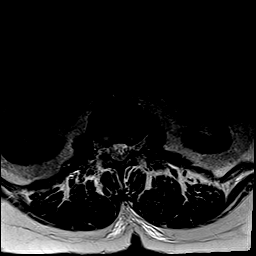
[im 38/38]
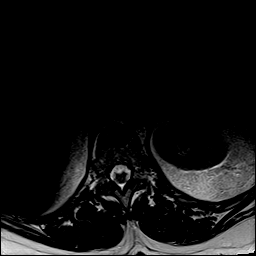

[Series 13: T1 · axial · 4.0mm · 0.39mm/px · z∈[-132,+74]mm · 6 of 38 slices shown (3 of 4)]
[im 1/38]
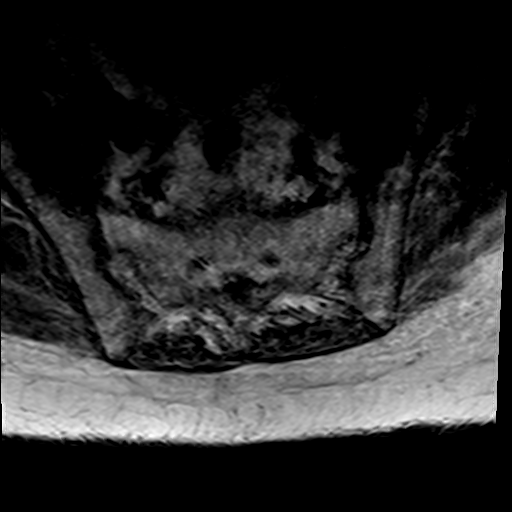
[im 8/38]
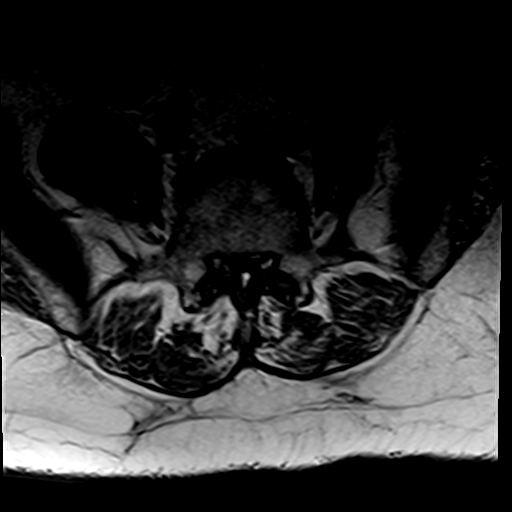
[im 15/38]
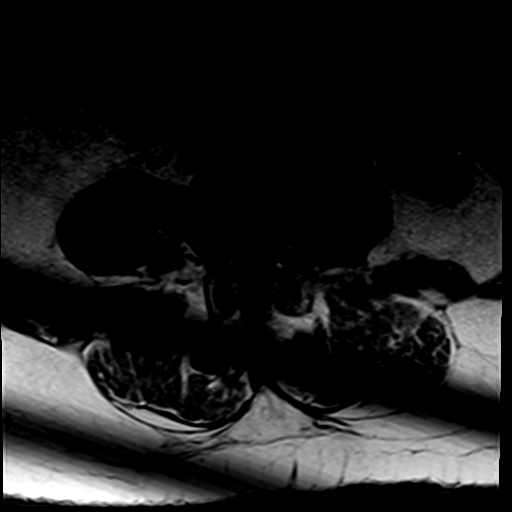
[im 23/38]
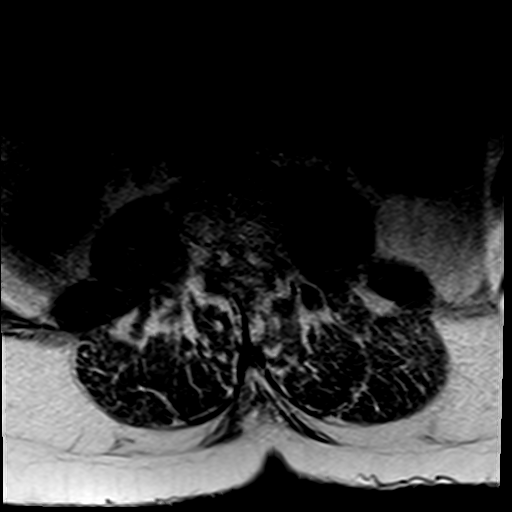
[im 30/38]
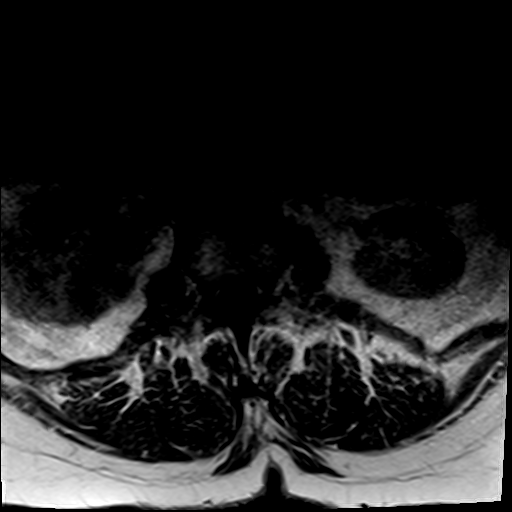
[im 38/38]
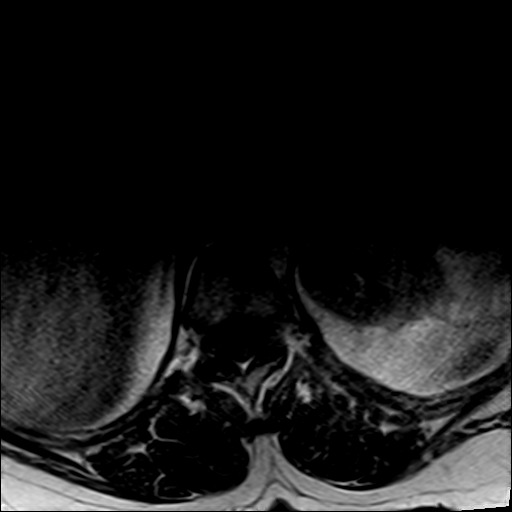

[Series 14: T1 · axial · 4.0mm · 0.39mm/px · 1 of 38 slices shown (4 of 4)]
[im 1/38]
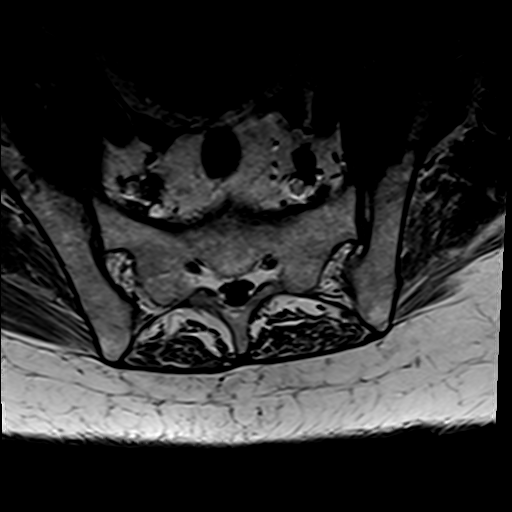

[29 of 48 positions shown; findings below may reference images not displayed]

FINDINGS: Segmentation:  Standard

Alignment:  Grade 1 anterolisthesis at L4-5 and L3-4

Vertebrae:  No fracture, evidence of discitis, or bone lesion.

Conus medullaris and cauda equina: Conus extends to the L1 level.
Conus and cauda equina appear normal.

Paraspinal and other soft tissues: Negative

Disc levels:

T12-L1: Normal.

L1-L2: Normal disc space and facet joints. There is no spinal canal
stenosis. No neural foraminal stenosis.

L2-L3: Normal disc space and facet joints. There is no spinal canal
stenosis. No neural foraminal stenosis.

L3-L4: Moderate facet hypertrophy. Small disc bulge. Severe spinal
canal stenosis. Moderate bilateral neural foraminal stenosis.

L4-L5: Severe facet hypertrophy with mild diffuse disc bulge.
Moderate spinal canal stenosis. Moderate right neural foraminal
stenosis.

L5-S1: Mild facet hypertrophy with minimal disc bulge. There is no
spinal canal stenosis. No neural foraminal stenosis.

Visualized sacrum: Normal.

No abnormal contrast enhancement.
IMPRESSION: 1. L3-L4 severe spinal canal stenosis with moderate bilateral neural
foraminal stenosis secondary to combination of facet arthrosis and
disc bulge.
2. L4-L5 severe facet arthrosis with moderate spinal canal and
moderate right neural foraminal stenosis.
3. Grade 1 anterolisthesis at L3-4 and L4-5.

## 2023-04-17 DIAGNOSIS — M6281 Muscle weakness (generalized): Secondary | ICD-10-CM | POA: Diagnosis not present

## 2023-04-17 DIAGNOSIS — R278 Other lack of coordination: Secondary | ICD-10-CM | POA: Diagnosis not present

## 2023-04-18 DIAGNOSIS — M6281 Muscle weakness (generalized): Secondary | ICD-10-CM | POA: Diagnosis not present

## 2023-04-18 DIAGNOSIS — D860 Sarcoidosis of lung: Secondary | ICD-10-CM | POA: Diagnosis not present

## 2023-04-18 DIAGNOSIS — E237 Disorder of pituitary gland, unspecified: Secondary | ICD-10-CM | POA: Diagnosis not present

## 2023-04-18 DIAGNOSIS — I69351 Hemiplegia and hemiparesis following cerebral infarction affecting right dominant side: Secondary | ICD-10-CM | POA: Diagnosis not present

## 2023-04-18 DIAGNOSIS — R278 Other lack of coordination: Secondary | ICD-10-CM | POA: Diagnosis not present

## 2023-04-19 DIAGNOSIS — R278 Other lack of coordination: Secondary | ICD-10-CM | POA: Diagnosis not present

## 2023-04-19 DIAGNOSIS — M6281 Muscle weakness (generalized): Secondary | ICD-10-CM | POA: Diagnosis not present

## 2023-04-20 DIAGNOSIS — R278 Other lack of coordination: Secondary | ICD-10-CM | POA: Diagnosis not present

## 2023-04-20 DIAGNOSIS — M6281 Muscle weakness (generalized): Secondary | ICD-10-CM | POA: Diagnosis not present

## 2023-04-21 DIAGNOSIS — R278 Other lack of coordination: Secondary | ICD-10-CM | POA: Diagnosis not present

## 2023-04-21 DIAGNOSIS — M6281 Muscle weakness (generalized): Secondary | ICD-10-CM | POA: Diagnosis not present

## 2023-04-22 DIAGNOSIS — R278 Other lack of coordination: Secondary | ICD-10-CM | POA: Diagnosis not present

## 2023-04-22 DIAGNOSIS — M6281 Muscle weakness (generalized): Secondary | ICD-10-CM | POA: Diagnosis not present

## 2023-04-25 DIAGNOSIS — E038 Other specified hypothyroidism: Secondary | ICD-10-CM | POA: Diagnosis not present

## 2023-04-25 DIAGNOSIS — R278 Other lack of coordination: Secondary | ICD-10-CM | POA: Diagnosis not present

## 2023-04-25 DIAGNOSIS — I69351 Hemiplegia and hemiparesis following cerebral infarction affecting right dominant side: Secondary | ICD-10-CM | POA: Diagnosis not present

## 2023-04-25 DIAGNOSIS — I82509 Chronic embolism and thrombosis of unspecified deep veins of unspecified lower extremity: Secondary | ICD-10-CM | POA: Diagnosis not present

## 2023-04-25 DIAGNOSIS — D860 Sarcoidosis of lung: Secondary | ICD-10-CM | POA: Diagnosis not present

## 2023-04-25 DIAGNOSIS — M6281 Muscle weakness (generalized): Secondary | ICD-10-CM | POA: Diagnosis not present

## 2023-04-25 DIAGNOSIS — E237 Disorder of pituitary gland, unspecified: Secondary | ICD-10-CM | POA: Diagnosis not present

## 2023-04-26 DIAGNOSIS — R278 Other lack of coordination: Secondary | ICD-10-CM | POA: Diagnosis not present

## 2023-04-26 DIAGNOSIS — M6281 Muscle weakness (generalized): Secondary | ICD-10-CM | POA: Diagnosis not present

## 2023-04-27 DIAGNOSIS — R278 Other lack of coordination: Secondary | ICD-10-CM | POA: Diagnosis not present

## 2023-04-27 DIAGNOSIS — M6281 Muscle weakness (generalized): Secondary | ICD-10-CM | POA: Diagnosis not present

## 2023-05-05 DIAGNOSIS — I69351 Hemiplegia and hemiparesis following cerebral infarction affecting right dominant side: Secondary | ICD-10-CM | POA: Diagnosis not present

## 2023-05-05 DIAGNOSIS — E237 Disorder of pituitary gland, unspecified: Secondary | ICD-10-CM | POA: Diagnosis not present

## 2023-05-06 DIAGNOSIS — R278 Other lack of coordination: Secondary | ICD-10-CM | POA: Diagnosis not present

## 2023-05-06 DIAGNOSIS — M6281 Muscle weakness (generalized): Secondary | ICD-10-CM | POA: Diagnosis not present

## 2023-05-09 DIAGNOSIS — E119 Type 2 diabetes mellitus without complications: Secondary | ICD-10-CM | POA: Diagnosis not present

## 2023-05-09 DIAGNOSIS — L988 Other specified disorders of the skin and subcutaneous tissue: Secondary | ICD-10-CM | POA: Diagnosis not present

## 2023-05-23 DIAGNOSIS — E559 Vitamin D deficiency, unspecified: Secondary | ICD-10-CM | POA: Diagnosis not present

## 2023-05-23 DIAGNOSIS — I69351 Hemiplegia and hemiparesis following cerebral infarction affecting right dominant side: Secondary | ICD-10-CM | POA: Diagnosis not present

## 2023-05-23 DIAGNOSIS — M6281 Muscle weakness (generalized): Secondary | ICD-10-CM | POA: Diagnosis not present

## 2023-05-23 DIAGNOSIS — I82509 Chronic embolism and thrombosis of unspecified deep veins of unspecified lower extremity: Secondary | ICD-10-CM | POA: Diagnosis not present

## 2023-05-23 DIAGNOSIS — E237 Disorder of pituitary gland, unspecified: Secondary | ICD-10-CM | POA: Diagnosis not present

## 2023-05-23 DIAGNOSIS — E038 Other specified hypothyroidism: Secondary | ICD-10-CM | POA: Diagnosis not present

## 2023-05-23 DIAGNOSIS — D860 Sarcoidosis of lung: Secondary | ICD-10-CM | POA: Diagnosis not present

## 2023-05-26 DIAGNOSIS — I10 Essential (primary) hypertension: Secondary | ICD-10-CM | POA: Diagnosis not present

## 2023-06-05 DIAGNOSIS — E038 Other specified hypothyroidism: Secondary | ICD-10-CM | POA: Diagnosis not present

## 2023-06-05 DIAGNOSIS — E237 Disorder of pituitary gland, unspecified: Secondary | ICD-10-CM | POA: Diagnosis not present

## 2023-06-06 DIAGNOSIS — E237 Disorder of pituitary gland, unspecified: Secondary | ICD-10-CM | POA: Diagnosis not present

## 2023-06-06 DIAGNOSIS — E038 Other specified hypothyroidism: Secondary | ICD-10-CM | POA: Diagnosis not present

## 2023-06-06 DIAGNOSIS — I69351 Hemiplegia and hemiparesis following cerebral infarction affecting right dominant side: Secondary | ICD-10-CM | POA: Diagnosis not present

## 2023-06-06 DIAGNOSIS — M6281 Muscle weakness (generalized): Secondary | ICD-10-CM | POA: Diagnosis not present

## 2023-06-06 DIAGNOSIS — I82509 Chronic embolism and thrombosis of unspecified deep veins of unspecified lower extremity: Secondary | ICD-10-CM | POA: Diagnosis not present

## 2023-06-06 DIAGNOSIS — D860 Sarcoidosis of lung: Secondary | ICD-10-CM | POA: Diagnosis not present

## 2023-06-20 DIAGNOSIS — M6281 Muscle weakness (generalized): Secondary | ICD-10-CM | POA: Diagnosis not present

## 2023-06-20 DIAGNOSIS — D860 Sarcoidosis of lung: Secondary | ICD-10-CM | POA: Diagnosis not present

## 2023-06-20 DIAGNOSIS — E237 Disorder of pituitary gland, unspecified: Secondary | ICD-10-CM | POA: Diagnosis not present

## 2023-06-20 DIAGNOSIS — I82509 Chronic embolism and thrombosis of unspecified deep veins of unspecified lower extremity: Secondary | ICD-10-CM | POA: Diagnosis not present

## 2023-06-20 DIAGNOSIS — E038 Other specified hypothyroidism: Secondary | ICD-10-CM | POA: Diagnosis not present

## 2023-06-20 DIAGNOSIS — I69351 Hemiplegia and hemiparesis following cerebral infarction affecting right dominant side: Secondary | ICD-10-CM | POA: Diagnosis not present

## 2023-06-20 DIAGNOSIS — E559 Vitamin D deficiency, unspecified: Secondary | ICD-10-CM | POA: Diagnosis not present

## 2023-07-13 DIAGNOSIS — R278 Other lack of coordination: Secondary | ICD-10-CM | POA: Diagnosis not present

## 2023-07-13 DIAGNOSIS — M6281 Muscle weakness (generalized): Secondary | ICD-10-CM | POA: Diagnosis not present

## 2023-07-14 DIAGNOSIS — R278 Other lack of coordination: Secondary | ICD-10-CM | POA: Diagnosis not present

## 2023-07-14 DIAGNOSIS — M6281 Muscle weakness (generalized): Secondary | ICD-10-CM | POA: Diagnosis not present

## 2023-07-15 DIAGNOSIS — R278 Other lack of coordination: Secondary | ICD-10-CM | POA: Diagnosis not present

## 2023-07-15 DIAGNOSIS — M6281 Muscle weakness (generalized): Secondary | ICD-10-CM | POA: Diagnosis not present

## 2023-07-16 DIAGNOSIS — R278 Other lack of coordination: Secondary | ICD-10-CM | POA: Diagnosis not present

## 2023-07-16 DIAGNOSIS — M6281 Muscle weakness (generalized): Secondary | ICD-10-CM | POA: Diagnosis not present

## 2023-07-17 DIAGNOSIS — E237 Disorder of pituitary gland, unspecified: Secondary | ICD-10-CM | POA: Diagnosis not present

## 2023-07-17 DIAGNOSIS — R278 Other lack of coordination: Secondary | ICD-10-CM | POA: Diagnosis not present

## 2023-07-17 DIAGNOSIS — M6281 Muscle weakness (generalized): Secondary | ICD-10-CM | POA: Diagnosis not present

## 2023-07-17 DIAGNOSIS — E038 Other specified hypothyroidism: Secondary | ICD-10-CM | POA: Diagnosis not present

## 2023-07-18 DIAGNOSIS — I69351 Hemiplegia and hemiparesis following cerebral infarction affecting right dominant side: Secondary | ICD-10-CM | POA: Diagnosis not present

## 2023-07-18 DIAGNOSIS — E038 Other specified hypothyroidism: Secondary | ICD-10-CM | POA: Diagnosis not present

## 2023-07-18 DIAGNOSIS — M6281 Muscle weakness (generalized): Secondary | ICD-10-CM | POA: Diagnosis not present

## 2023-07-18 DIAGNOSIS — R278 Other lack of coordination: Secondary | ICD-10-CM | POA: Diagnosis not present

## 2023-07-18 DIAGNOSIS — I82509 Chronic embolism and thrombosis of unspecified deep veins of unspecified lower extremity: Secondary | ICD-10-CM | POA: Diagnosis not present

## 2023-07-18 DIAGNOSIS — E278 Other specified disorders of adrenal gland: Secondary | ICD-10-CM | POA: Diagnosis not present

## 2023-07-18 DIAGNOSIS — D860 Sarcoidosis of lung: Secondary | ICD-10-CM | POA: Diagnosis not present

## 2023-07-19 DIAGNOSIS — R278 Other lack of coordination: Secondary | ICD-10-CM | POA: Diagnosis not present

## 2023-07-19 DIAGNOSIS — M6281 Muscle weakness (generalized): Secondary | ICD-10-CM | POA: Diagnosis not present

## 2023-07-21 DIAGNOSIS — R278 Other lack of coordination: Secondary | ICD-10-CM | POA: Diagnosis not present

## 2023-07-21 DIAGNOSIS — M6281 Muscle weakness (generalized): Secondary | ICD-10-CM | POA: Diagnosis not present

## 2023-07-23 DIAGNOSIS — R278 Other lack of coordination: Secondary | ICD-10-CM | POA: Diagnosis not present

## 2023-07-23 DIAGNOSIS — M6281 Muscle weakness (generalized): Secondary | ICD-10-CM | POA: Diagnosis not present

## 2023-07-24 DIAGNOSIS — R278 Other lack of coordination: Secondary | ICD-10-CM | POA: Diagnosis not present

## 2023-07-24 DIAGNOSIS — M6281 Muscle weakness (generalized): Secondary | ICD-10-CM | POA: Diagnosis not present

## 2023-07-26 DIAGNOSIS — R278 Other lack of coordination: Secondary | ICD-10-CM | POA: Diagnosis not present

## 2023-07-26 DIAGNOSIS — M6281 Muscle weakness (generalized): Secondary | ICD-10-CM | POA: Diagnosis not present

## 2023-07-27 DIAGNOSIS — M6281 Muscle weakness (generalized): Secondary | ICD-10-CM | POA: Diagnosis not present

## 2023-07-27 DIAGNOSIS — R278 Other lack of coordination: Secondary | ICD-10-CM | POA: Diagnosis not present

## 2023-07-27 DIAGNOSIS — E119 Type 2 diabetes mellitus without complications: Secondary | ICD-10-CM | POA: Diagnosis not present

## 2023-07-29 DIAGNOSIS — R278 Other lack of coordination: Secondary | ICD-10-CM | POA: Diagnosis not present

## 2023-07-29 DIAGNOSIS — M6281 Muscle weakness (generalized): Secondary | ICD-10-CM | POA: Diagnosis not present

## 2023-07-31 DIAGNOSIS — M6281 Muscle weakness (generalized): Secondary | ICD-10-CM | POA: Diagnosis not present

## 2023-07-31 DIAGNOSIS — R278 Other lack of coordination: Secondary | ICD-10-CM | POA: Diagnosis not present

## 2023-08-01 DIAGNOSIS — M6281 Muscle weakness (generalized): Secondary | ICD-10-CM | POA: Diagnosis not present

## 2023-08-01 DIAGNOSIS — R278 Other lack of coordination: Secondary | ICD-10-CM | POA: Diagnosis not present

## 2023-08-02 DIAGNOSIS — R278 Other lack of coordination: Secondary | ICD-10-CM | POA: Diagnosis not present

## 2023-08-02 DIAGNOSIS — M6281 Muscle weakness (generalized): Secondary | ICD-10-CM | POA: Diagnosis not present

## 2023-08-03 DIAGNOSIS — R278 Other lack of coordination: Secondary | ICD-10-CM | POA: Diagnosis not present

## 2023-08-03 DIAGNOSIS — M6281 Muscle weakness (generalized): Secondary | ICD-10-CM | POA: Diagnosis not present

## 2023-08-03 DIAGNOSIS — I639 Cerebral infarction, unspecified: Secondary | ICD-10-CM | POA: Diagnosis not present

## 2023-08-04 DIAGNOSIS — R278 Other lack of coordination: Secondary | ICD-10-CM | POA: Diagnosis not present

## 2023-08-04 DIAGNOSIS — I639 Cerebral infarction, unspecified: Secondary | ICD-10-CM | POA: Diagnosis not present

## 2023-08-04 DIAGNOSIS — M6281 Muscle weakness (generalized): Secondary | ICD-10-CM | POA: Diagnosis not present

## 2023-08-07 DIAGNOSIS — R278 Other lack of coordination: Secondary | ICD-10-CM | POA: Diagnosis not present

## 2023-08-07 DIAGNOSIS — M6281 Muscle weakness (generalized): Secondary | ICD-10-CM | POA: Diagnosis not present

## 2023-08-07 DIAGNOSIS — I639 Cerebral infarction, unspecified: Secondary | ICD-10-CM | POA: Diagnosis not present

## 2023-08-08 DIAGNOSIS — M6281 Muscle weakness (generalized): Secondary | ICD-10-CM | POA: Diagnosis not present

## 2023-08-08 DIAGNOSIS — R278 Other lack of coordination: Secondary | ICD-10-CM | POA: Diagnosis not present

## 2023-08-08 DIAGNOSIS — I639 Cerebral infarction, unspecified: Secondary | ICD-10-CM | POA: Diagnosis not present

## 2023-08-09 DIAGNOSIS — I639 Cerebral infarction, unspecified: Secondary | ICD-10-CM | POA: Diagnosis not present

## 2023-08-09 DIAGNOSIS — R278 Other lack of coordination: Secondary | ICD-10-CM | POA: Diagnosis not present

## 2023-08-09 DIAGNOSIS — M6281 Muscle weakness (generalized): Secondary | ICD-10-CM | POA: Diagnosis not present

## 2023-08-10 DIAGNOSIS — I639 Cerebral infarction, unspecified: Secondary | ICD-10-CM | POA: Diagnosis not present

## 2023-08-10 DIAGNOSIS — R278 Other lack of coordination: Secondary | ICD-10-CM | POA: Diagnosis not present

## 2023-08-10 DIAGNOSIS — M6281 Muscle weakness (generalized): Secondary | ICD-10-CM | POA: Diagnosis not present

## 2023-08-11 DIAGNOSIS — R278 Other lack of coordination: Secondary | ICD-10-CM | POA: Diagnosis not present

## 2023-08-11 DIAGNOSIS — E038 Other specified hypothyroidism: Secondary | ICD-10-CM | POA: Diagnosis not present

## 2023-08-11 DIAGNOSIS — I82509 Chronic embolism and thrombosis of unspecified deep veins of unspecified lower extremity: Secondary | ICD-10-CM | POA: Diagnosis not present

## 2023-08-11 DIAGNOSIS — D860 Sarcoidosis of lung: Secondary | ICD-10-CM | POA: Diagnosis not present

## 2023-08-11 DIAGNOSIS — I69351 Hemiplegia and hemiparesis following cerebral infarction affecting right dominant side: Secondary | ICD-10-CM | POA: Diagnosis not present

## 2023-08-11 DIAGNOSIS — M6281 Muscle weakness (generalized): Secondary | ICD-10-CM | POA: Diagnosis not present

## 2023-08-11 DIAGNOSIS — I639 Cerebral infarction, unspecified: Secondary | ICD-10-CM | POA: Diagnosis not present

## 2023-08-14 DIAGNOSIS — E119 Type 2 diabetes mellitus without complications: Secondary | ICD-10-CM | POA: Diagnosis not present

## 2023-08-14 DIAGNOSIS — I10 Essential (primary) hypertension: Secondary | ICD-10-CM | POA: Diagnosis not present

## 2023-08-15 DIAGNOSIS — Z8673 Personal history of transient ischemic attack (TIA), and cerebral infarction without residual deficits: Secondary | ICD-10-CM | POA: Diagnosis not present

## 2023-08-15 DIAGNOSIS — M6281 Muscle weakness (generalized): Secondary | ICD-10-CM | POA: Diagnosis not present

## 2023-08-15 DIAGNOSIS — R278 Other lack of coordination: Secondary | ICD-10-CM | POA: Diagnosis not present

## 2023-08-15 DIAGNOSIS — D860 Sarcoidosis of lung: Secondary | ICD-10-CM | POA: Diagnosis not present

## 2023-08-15 DIAGNOSIS — I639 Cerebral infarction, unspecified: Secondary | ICD-10-CM | POA: Diagnosis not present

## 2023-08-15 DIAGNOSIS — I82509 Chronic embolism and thrombosis of unspecified deep veins of unspecified lower extremity: Secondary | ICD-10-CM | POA: Diagnosis not present

## 2023-08-15 DIAGNOSIS — E237 Disorder of pituitary gland, unspecified: Secondary | ICD-10-CM | POA: Diagnosis not present

## 2023-08-15 DIAGNOSIS — E038 Other specified hypothyroidism: Secondary | ICD-10-CM | POA: Diagnosis not present

## 2023-08-21 DIAGNOSIS — I639 Cerebral infarction, unspecified: Secondary | ICD-10-CM | POA: Diagnosis not present

## 2023-08-21 DIAGNOSIS — M6281 Muscle weakness (generalized): Secondary | ICD-10-CM | POA: Diagnosis not present

## 2023-08-21 DIAGNOSIS — R278 Other lack of coordination: Secondary | ICD-10-CM | POA: Diagnosis not present

## 2023-08-23 DIAGNOSIS — I639 Cerebral infarction, unspecified: Secondary | ICD-10-CM | POA: Diagnosis not present

## 2023-08-23 DIAGNOSIS — M6281 Muscle weakness (generalized): Secondary | ICD-10-CM | POA: Diagnosis not present

## 2023-08-23 DIAGNOSIS — R278 Other lack of coordination: Secondary | ICD-10-CM | POA: Diagnosis not present

## 2023-08-25 DIAGNOSIS — M6281 Muscle weakness (generalized): Secondary | ICD-10-CM | POA: Diagnosis not present

## 2023-08-25 DIAGNOSIS — I639 Cerebral infarction, unspecified: Secondary | ICD-10-CM | POA: Diagnosis not present

## 2023-08-25 DIAGNOSIS — R278 Other lack of coordination: Secondary | ICD-10-CM | POA: Diagnosis not present

## 2023-09-12 DIAGNOSIS — Z86718 Personal history of other venous thrombosis and embolism: Secondary | ICD-10-CM | POA: Diagnosis not present

## 2023-09-12 DIAGNOSIS — E039 Hypothyroidism, unspecified: Secondary | ICD-10-CM | POA: Diagnosis not present

## 2023-09-12 DIAGNOSIS — Z7901 Long term (current) use of anticoagulants: Secondary | ICD-10-CM | POA: Diagnosis not present

## 2023-09-12 DIAGNOSIS — I6930 Unspecified sequelae of cerebral infarction: Secondary | ICD-10-CM | POA: Diagnosis not present

## 2023-09-12 DIAGNOSIS — Z7952 Long term (current) use of systemic steroids: Secondary | ICD-10-CM | POA: Diagnosis not present

## 2023-09-12 DIAGNOSIS — I10 Essential (primary) hypertension: Secondary | ICD-10-CM | POA: Diagnosis not present

## 2023-09-12 DIAGNOSIS — D860 Sarcoidosis of lung: Secondary | ICD-10-CM | POA: Diagnosis not present

## 2023-09-12 DIAGNOSIS — E119 Type 2 diabetes mellitus without complications: Secondary | ICD-10-CM | POA: Diagnosis not present

## 2023-10-01 DIAGNOSIS — E039 Hypothyroidism, unspecified: Secondary | ICD-10-CM | POA: Diagnosis not present

## 2023-10-01 DIAGNOSIS — I10 Essential (primary) hypertension: Secondary | ICD-10-CM | POA: Diagnosis not present

## 2023-10-01 DIAGNOSIS — D860 Sarcoidosis of lung: Secondary | ICD-10-CM | POA: Diagnosis not present

## 2023-10-01 DIAGNOSIS — E119 Type 2 diabetes mellitus without complications: Secondary | ICD-10-CM | POA: Diagnosis not present

## 2023-10-01 DIAGNOSIS — Z86718 Personal history of other venous thrombosis and embolism: Secondary | ICD-10-CM | POA: Diagnosis not present

## 2023-10-01 DIAGNOSIS — I6930 Unspecified sequelae of cerebral infarction: Secondary | ICD-10-CM | POA: Diagnosis not present

## 2023-10-04 DIAGNOSIS — E1151 Type 2 diabetes mellitus with diabetic peripheral angiopathy without gangrene: Secondary | ICD-10-CM | POA: Diagnosis not present

## 2023-10-04 DIAGNOSIS — L603 Nail dystrophy: Secondary | ICD-10-CM | POA: Diagnosis not present

## 2023-10-04 DIAGNOSIS — L602 Onychogryphosis: Secondary | ICD-10-CM | POA: Diagnosis not present

## 2023-10-04 DIAGNOSIS — Z7984 Long term (current) use of oral hypoglycemic drugs: Secondary | ICD-10-CM | POA: Diagnosis not present

## 2023-10-04 DIAGNOSIS — L84 Corns and callosities: Secondary | ICD-10-CM | POA: Diagnosis not present

## 2023-10-10 DIAGNOSIS — E119 Type 2 diabetes mellitus without complications: Secondary | ICD-10-CM | POA: Diagnosis not present

## 2023-10-10 DIAGNOSIS — E039 Hypothyroidism, unspecified: Secondary | ICD-10-CM | POA: Diagnosis not present

## 2023-10-10 DIAGNOSIS — R279 Unspecified lack of coordination: Secondary | ICD-10-CM | POA: Diagnosis not present

## 2023-10-10 DIAGNOSIS — M6259 Muscle wasting and atrophy, not elsewhere classified, multiple sites: Secondary | ICD-10-CM | POA: Diagnosis not present

## 2023-10-10 DIAGNOSIS — I82509 Chronic embolism and thrombosis of unspecified deep veins of unspecified lower extremity: Secondary | ICD-10-CM | POA: Diagnosis not present

## 2023-10-10 DIAGNOSIS — I69351 Hemiplegia and hemiparesis following cerebral infarction affecting right dominant side: Secondary | ICD-10-CM | POA: Diagnosis not present

## 2023-10-10 DIAGNOSIS — I639 Cerebral infarction, unspecified: Secondary | ICD-10-CM | POA: Diagnosis not present

## 2023-10-10 DIAGNOSIS — I10 Essential (primary) hypertension: Secondary | ICD-10-CM | POA: Diagnosis not present

## 2023-10-11 DIAGNOSIS — I639 Cerebral infarction, unspecified: Secondary | ICD-10-CM | POA: Diagnosis not present

## 2023-10-11 DIAGNOSIS — R279 Unspecified lack of coordination: Secondary | ICD-10-CM | POA: Diagnosis not present

## 2023-10-11 DIAGNOSIS — M6259 Muscle wasting and atrophy, not elsewhere classified, multiple sites: Secondary | ICD-10-CM | POA: Diagnosis not present

## 2023-10-11 DIAGNOSIS — I69351 Hemiplegia and hemiparesis following cerebral infarction affecting right dominant side: Secondary | ICD-10-CM | POA: Diagnosis not present

## 2023-10-12 DIAGNOSIS — I639 Cerebral infarction, unspecified: Secondary | ICD-10-CM | POA: Diagnosis not present

## 2023-10-12 DIAGNOSIS — I69351 Hemiplegia and hemiparesis following cerebral infarction affecting right dominant side: Secondary | ICD-10-CM | POA: Diagnosis not present

## 2023-10-12 DIAGNOSIS — R279 Unspecified lack of coordination: Secondary | ICD-10-CM | POA: Diagnosis not present

## 2023-10-12 DIAGNOSIS — M6259 Muscle wasting and atrophy, not elsewhere classified, multiple sites: Secondary | ICD-10-CM | POA: Diagnosis not present

## 2023-10-13 DIAGNOSIS — I639 Cerebral infarction, unspecified: Secondary | ICD-10-CM | POA: Diagnosis not present

## 2023-10-13 DIAGNOSIS — M6259 Muscle wasting and atrophy, not elsewhere classified, multiple sites: Secondary | ICD-10-CM | POA: Diagnosis not present

## 2023-10-13 DIAGNOSIS — R279 Unspecified lack of coordination: Secondary | ICD-10-CM | POA: Diagnosis not present

## 2023-10-13 DIAGNOSIS — I69351 Hemiplegia and hemiparesis following cerebral infarction affecting right dominant side: Secondary | ICD-10-CM | POA: Diagnosis not present

## 2023-10-16 DIAGNOSIS — M6259 Muscle wasting and atrophy, not elsewhere classified, multiple sites: Secondary | ICD-10-CM | POA: Diagnosis not present

## 2023-10-16 DIAGNOSIS — I69351 Hemiplegia and hemiparesis following cerebral infarction affecting right dominant side: Secondary | ICD-10-CM | POA: Diagnosis not present

## 2023-10-16 DIAGNOSIS — R279 Unspecified lack of coordination: Secondary | ICD-10-CM | POA: Diagnosis not present

## 2023-10-16 DIAGNOSIS — I639 Cerebral infarction, unspecified: Secondary | ICD-10-CM | POA: Diagnosis not present

## 2023-10-17 DIAGNOSIS — R279 Unspecified lack of coordination: Secondary | ICD-10-CM | POA: Diagnosis not present

## 2023-10-17 DIAGNOSIS — M6259 Muscle wasting and atrophy, not elsewhere classified, multiple sites: Secondary | ICD-10-CM | POA: Diagnosis not present

## 2023-10-17 DIAGNOSIS — I69351 Hemiplegia and hemiparesis following cerebral infarction affecting right dominant side: Secondary | ICD-10-CM | POA: Diagnosis not present

## 2023-10-17 DIAGNOSIS — I639 Cerebral infarction, unspecified: Secondary | ICD-10-CM | POA: Diagnosis not present

## 2023-10-18 DIAGNOSIS — M6259 Muscle wasting and atrophy, not elsewhere classified, multiple sites: Secondary | ICD-10-CM | POA: Diagnosis not present

## 2023-10-18 DIAGNOSIS — I69351 Hemiplegia and hemiparesis following cerebral infarction affecting right dominant side: Secondary | ICD-10-CM | POA: Diagnosis not present

## 2023-10-18 DIAGNOSIS — I639 Cerebral infarction, unspecified: Secondary | ICD-10-CM | POA: Diagnosis not present

## 2023-10-18 DIAGNOSIS — R279 Unspecified lack of coordination: Secondary | ICD-10-CM | POA: Diagnosis not present

## 2023-10-19 DIAGNOSIS — I69351 Hemiplegia and hemiparesis following cerebral infarction affecting right dominant side: Secondary | ICD-10-CM | POA: Diagnosis not present

## 2023-10-19 DIAGNOSIS — M6259 Muscle wasting and atrophy, not elsewhere classified, multiple sites: Secondary | ICD-10-CM | POA: Diagnosis not present

## 2023-10-19 DIAGNOSIS — I639 Cerebral infarction, unspecified: Secondary | ICD-10-CM | POA: Diagnosis not present

## 2023-10-19 DIAGNOSIS — R279 Unspecified lack of coordination: Secondary | ICD-10-CM | POA: Diagnosis not present

## 2023-10-20 DIAGNOSIS — R279 Unspecified lack of coordination: Secondary | ICD-10-CM | POA: Diagnosis not present

## 2023-10-20 DIAGNOSIS — I69351 Hemiplegia and hemiparesis following cerebral infarction affecting right dominant side: Secondary | ICD-10-CM | POA: Diagnosis not present

## 2023-10-20 DIAGNOSIS — I639 Cerebral infarction, unspecified: Secondary | ICD-10-CM | POA: Diagnosis not present

## 2023-10-20 DIAGNOSIS — M6259 Muscle wasting and atrophy, not elsewhere classified, multiple sites: Secondary | ICD-10-CM | POA: Diagnosis not present

## 2023-10-21 DIAGNOSIS — I69351 Hemiplegia and hemiparesis following cerebral infarction affecting right dominant side: Secondary | ICD-10-CM | POA: Diagnosis not present

## 2023-10-21 DIAGNOSIS — M6259 Muscle wasting and atrophy, not elsewhere classified, multiple sites: Secondary | ICD-10-CM | POA: Diagnosis not present

## 2023-10-21 DIAGNOSIS — I639 Cerebral infarction, unspecified: Secondary | ICD-10-CM | POA: Diagnosis not present

## 2023-10-21 DIAGNOSIS — R279 Unspecified lack of coordination: Secondary | ICD-10-CM | POA: Diagnosis not present

## 2023-10-23 DIAGNOSIS — M6259 Muscle wasting and atrophy, not elsewhere classified, multiple sites: Secondary | ICD-10-CM | POA: Diagnosis not present

## 2023-10-23 DIAGNOSIS — I639 Cerebral infarction, unspecified: Secondary | ICD-10-CM | POA: Diagnosis not present

## 2023-10-23 DIAGNOSIS — I69351 Hemiplegia and hemiparesis following cerebral infarction affecting right dominant side: Secondary | ICD-10-CM | POA: Diagnosis not present

## 2023-10-23 DIAGNOSIS — R279 Unspecified lack of coordination: Secondary | ICD-10-CM | POA: Diagnosis not present

## 2023-10-24 DIAGNOSIS — H25813 Combined forms of age-related cataract, bilateral: Secondary | ICD-10-CM | POA: Diagnosis not present

## 2023-10-24 DIAGNOSIS — R279 Unspecified lack of coordination: Secondary | ICD-10-CM | POA: Diagnosis not present

## 2023-10-24 DIAGNOSIS — M6259 Muscle wasting and atrophy, not elsewhere classified, multiple sites: Secondary | ICD-10-CM | POA: Diagnosis not present

## 2023-10-24 DIAGNOSIS — I639 Cerebral infarction, unspecified: Secondary | ICD-10-CM | POA: Diagnosis not present

## 2023-10-24 DIAGNOSIS — I69351 Hemiplegia and hemiparesis following cerebral infarction affecting right dominant side: Secondary | ICD-10-CM | POA: Diagnosis not present

## 2023-10-24 DIAGNOSIS — H524 Presbyopia: Secondary | ICD-10-CM | POA: Diagnosis not present

## 2023-10-25 DIAGNOSIS — I639 Cerebral infarction, unspecified: Secondary | ICD-10-CM | POA: Diagnosis not present

## 2023-10-25 DIAGNOSIS — R279 Unspecified lack of coordination: Secondary | ICD-10-CM | POA: Diagnosis not present

## 2023-10-25 DIAGNOSIS — I69351 Hemiplegia and hemiparesis following cerebral infarction affecting right dominant side: Secondary | ICD-10-CM | POA: Diagnosis not present

## 2023-10-25 DIAGNOSIS — M6259 Muscle wasting and atrophy, not elsewhere classified, multiple sites: Secondary | ICD-10-CM | POA: Diagnosis not present

## 2023-10-26 DIAGNOSIS — G4733 Obstructive sleep apnea (adult) (pediatric): Secondary | ICD-10-CM | POA: Diagnosis not present

## 2023-10-26 DIAGNOSIS — M6259 Muscle wasting and atrophy, not elsewhere classified, multiple sites: Secondary | ICD-10-CM | POA: Diagnosis not present

## 2023-10-26 DIAGNOSIS — I69351 Hemiplegia and hemiparesis following cerebral infarction affecting right dominant side: Secondary | ICD-10-CM | POA: Diagnosis not present

## 2023-10-26 DIAGNOSIS — E1142 Type 2 diabetes mellitus with diabetic polyneuropathy: Secondary | ICD-10-CM | POA: Diagnosis not present

## 2023-10-26 DIAGNOSIS — I639 Cerebral infarction, unspecified: Secondary | ICD-10-CM | POA: Diagnosis not present

## 2023-10-26 DIAGNOSIS — R279 Unspecified lack of coordination: Secondary | ICD-10-CM | POA: Diagnosis not present

## 2023-10-27 DIAGNOSIS — R279 Unspecified lack of coordination: Secondary | ICD-10-CM | POA: Diagnosis not present

## 2023-10-27 DIAGNOSIS — I69351 Hemiplegia and hemiparesis following cerebral infarction affecting right dominant side: Secondary | ICD-10-CM | POA: Diagnosis not present

## 2023-10-27 DIAGNOSIS — I639 Cerebral infarction, unspecified: Secondary | ICD-10-CM | POA: Diagnosis not present

## 2023-10-27 DIAGNOSIS — M6259 Muscle wasting and atrophy, not elsewhere classified, multiple sites: Secondary | ICD-10-CM | POA: Diagnosis not present

## 2023-10-30 DIAGNOSIS — M6259 Muscle wasting and atrophy, not elsewhere classified, multiple sites: Secondary | ICD-10-CM | POA: Diagnosis not present

## 2023-10-30 DIAGNOSIS — R279 Unspecified lack of coordination: Secondary | ICD-10-CM | POA: Diagnosis not present

## 2023-10-30 DIAGNOSIS — I69351 Hemiplegia and hemiparesis following cerebral infarction affecting right dominant side: Secondary | ICD-10-CM | POA: Diagnosis not present

## 2023-10-30 DIAGNOSIS — I639 Cerebral infarction, unspecified: Secondary | ICD-10-CM | POA: Diagnosis not present

## 2023-10-31 DIAGNOSIS — I69351 Hemiplegia and hemiparesis following cerebral infarction affecting right dominant side: Secondary | ICD-10-CM | POA: Diagnosis not present

## 2023-10-31 DIAGNOSIS — M6259 Muscle wasting and atrophy, not elsewhere classified, multiple sites: Secondary | ICD-10-CM | POA: Diagnosis not present

## 2023-10-31 DIAGNOSIS — R279 Unspecified lack of coordination: Secondary | ICD-10-CM | POA: Diagnosis not present

## 2023-10-31 DIAGNOSIS — I639 Cerebral infarction, unspecified: Secondary | ICD-10-CM | POA: Diagnosis not present

## 2023-11-01 DIAGNOSIS — I639 Cerebral infarction, unspecified: Secondary | ICD-10-CM | POA: Diagnosis not present

## 2023-11-01 DIAGNOSIS — R279 Unspecified lack of coordination: Secondary | ICD-10-CM | POA: Diagnosis not present

## 2023-11-01 DIAGNOSIS — I69351 Hemiplegia and hemiparesis following cerebral infarction affecting right dominant side: Secondary | ICD-10-CM | POA: Diagnosis not present

## 2023-11-01 DIAGNOSIS — M6259 Muscle wasting and atrophy, not elsewhere classified, multiple sites: Secondary | ICD-10-CM | POA: Diagnosis not present

## 2023-11-02 DIAGNOSIS — M6259 Muscle wasting and atrophy, not elsewhere classified, multiple sites: Secondary | ICD-10-CM | POA: Diagnosis not present

## 2023-11-02 DIAGNOSIS — I639 Cerebral infarction, unspecified: Secondary | ICD-10-CM | POA: Diagnosis not present

## 2023-11-02 DIAGNOSIS — I69351 Hemiplegia and hemiparesis following cerebral infarction affecting right dominant side: Secondary | ICD-10-CM | POA: Diagnosis not present

## 2023-11-02 DIAGNOSIS — R279 Unspecified lack of coordination: Secondary | ICD-10-CM | POA: Diagnosis not present

## 2023-11-03 DIAGNOSIS — M6259 Muscle wasting and atrophy, not elsewhere classified, multiple sites: Secondary | ICD-10-CM | POA: Diagnosis not present

## 2023-11-03 DIAGNOSIS — R279 Unspecified lack of coordination: Secondary | ICD-10-CM | POA: Diagnosis not present

## 2023-11-06 DIAGNOSIS — M6259 Muscle wasting and atrophy, not elsewhere classified, multiple sites: Secondary | ICD-10-CM | POA: Diagnosis not present

## 2023-11-06 DIAGNOSIS — I69351 Hemiplegia and hemiparesis following cerebral infarction affecting right dominant side: Secondary | ICD-10-CM | POA: Diagnosis not present

## 2023-11-06 DIAGNOSIS — R279 Unspecified lack of coordination: Secondary | ICD-10-CM | POA: Diagnosis not present

## 2023-11-07 DIAGNOSIS — Z7901 Long term (current) use of anticoagulants: Secondary | ICD-10-CM | POA: Diagnosis not present

## 2023-11-07 DIAGNOSIS — I119 Hypertensive heart disease without heart failure: Secondary | ICD-10-CM | POA: Diagnosis not present

## 2023-11-07 DIAGNOSIS — I82509 Chronic embolism and thrombosis of unspecified deep veins of unspecified lower extremity: Secondary | ICD-10-CM | POA: Diagnosis not present

## 2023-11-07 DIAGNOSIS — M6259 Muscle wasting and atrophy, not elsewhere classified, multiple sites: Secondary | ICD-10-CM | POA: Diagnosis not present

## 2023-11-07 DIAGNOSIS — E119 Type 2 diabetes mellitus without complications: Secondary | ICD-10-CM | POA: Diagnosis not present

## 2023-11-07 DIAGNOSIS — R279 Unspecified lack of coordination: Secondary | ICD-10-CM | POA: Diagnosis not present

## 2023-11-08 DIAGNOSIS — M6259 Muscle wasting and atrophy, not elsewhere classified, multiple sites: Secondary | ICD-10-CM | POA: Diagnosis not present

## 2023-11-08 DIAGNOSIS — R279 Unspecified lack of coordination: Secondary | ICD-10-CM | POA: Diagnosis not present

## 2023-11-09 DIAGNOSIS — R279 Unspecified lack of coordination: Secondary | ICD-10-CM | POA: Diagnosis not present

## 2023-11-09 DIAGNOSIS — I69351 Hemiplegia and hemiparesis following cerebral infarction affecting right dominant side: Secondary | ICD-10-CM | POA: Diagnosis not present

## 2023-11-09 DIAGNOSIS — M6259 Muscle wasting and atrophy, not elsewhere classified, multiple sites: Secondary | ICD-10-CM | POA: Diagnosis not present

## 2023-11-10 DIAGNOSIS — R279 Unspecified lack of coordination: Secondary | ICD-10-CM | POA: Diagnosis not present

## 2023-11-10 DIAGNOSIS — I69351 Hemiplegia and hemiparesis following cerebral infarction affecting right dominant side: Secondary | ICD-10-CM | POA: Diagnosis not present

## 2023-11-10 DIAGNOSIS — M6259 Muscle wasting and atrophy, not elsewhere classified, multiple sites: Secondary | ICD-10-CM | POA: Diagnosis not present

## 2023-11-13 DIAGNOSIS — M6259 Muscle wasting and atrophy, not elsewhere classified, multiple sites: Secondary | ICD-10-CM | POA: Diagnosis not present

## 2023-11-13 DIAGNOSIS — I69351 Hemiplegia and hemiparesis following cerebral infarction affecting right dominant side: Secondary | ICD-10-CM | POA: Diagnosis not present

## 2023-11-13 DIAGNOSIS — R279 Unspecified lack of coordination: Secondary | ICD-10-CM | POA: Diagnosis not present

## 2023-11-14 DIAGNOSIS — I69351 Hemiplegia and hemiparesis following cerebral infarction affecting right dominant side: Secondary | ICD-10-CM | POA: Diagnosis not present

## 2023-11-14 DIAGNOSIS — M6259 Muscle wasting and atrophy, not elsewhere classified, multiple sites: Secondary | ICD-10-CM | POA: Diagnosis not present

## 2023-11-14 DIAGNOSIS — R279 Unspecified lack of coordination: Secondary | ICD-10-CM | POA: Diagnosis not present

## 2023-11-15 DIAGNOSIS — R279 Unspecified lack of coordination: Secondary | ICD-10-CM | POA: Diagnosis not present

## 2023-11-15 DIAGNOSIS — M6259 Muscle wasting and atrophy, not elsewhere classified, multiple sites: Secondary | ICD-10-CM | POA: Diagnosis not present

## 2023-11-15 DIAGNOSIS — I69351 Hemiplegia and hemiparesis following cerebral infarction affecting right dominant side: Secondary | ICD-10-CM | POA: Diagnosis not present

## 2023-11-16 DIAGNOSIS — I69351 Hemiplegia and hemiparesis following cerebral infarction affecting right dominant side: Secondary | ICD-10-CM | POA: Diagnosis not present

## 2023-11-16 DIAGNOSIS — R279 Unspecified lack of coordination: Secondary | ICD-10-CM | POA: Diagnosis not present

## 2023-11-16 DIAGNOSIS — M6259 Muscle wasting and atrophy, not elsewhere classified, multiple sites: Secondary | ICD-10-CM | POA: Diagnosis not present

## 2023-11-17 DIAGNOSIS — R279 Unspecified lack of coordination: Secondary | ICD-10-CM | POA: Diagnosis not present

## 2023-11-17 DIAGNOSIS — M6259 Muscle wasting and atrophy, not elsewhere classified, multiple sites: Secondary | ICD-10-CM | POA: Diagnosis not present

## 2023-11-20 DIAGNOSIS — M6259 Muscle wasting and atrophy, not elsewhere classified, multiple sites: Secondary | ICD-10-CM | POA: Diagnosis not present

## 2023-11-20 DIAGNOSIS — R279 Unspecified lack of coordination: Secondary | ICD-10-CM | POA: Diagnosis not present

## 2023-11-21 DIAGNOSIS — R279 Unspecified lack of coordination: Secondary | ICD-10-CM | POA: Diagnosis not present

## 2023-11-21 DIAGNOSIS — M6259 Muscle wasting and atrophy, not elsewhere classified, multiple sites: Secondary | ICD-10-CM | POA: Diagnosis not present

## 2023-11-23 DIAGNOSIS — G629 Polyneuropathy, unspecified: Secondary | ICD-10-CM | POA: Diagnosis not present

## 2023-12-01 DIAGNOSIS — G4733 Obstructive sleep apnea (adult) (pediatric): Secondary | ICD-10-CM | POA: Diagnosis not present

## 2023-12-01 DIAGNOSIS — I639 Cerebral infarction, unspecified: Secondary | ICD-10-CM | POA: Diagnosis not present

## 2023-12-08 DIAGNOSIS — L602 Onychogryphosis: Secondary | ICD-10-CM | POA: Diagnosis not present

## 2023-12-08 DIAGNOSIS — L603 Nail dystrophy: Secondary | ICD-10-CM | POA: Diagnosis not present

## 2023-12-08 DIAGNOSIS — Z7984 Long term (current) use of oral hypoglycemic drugs: Secondary | ICD-10-CM | POA: Diagnosis not present

## 2023-12-08 DIAGNOSIS — L84 Corns and callosities: Secondary | ICD-10-CM | POA: Diagnosis not present

## 2023-12-14 DIAGNOSIS — I119 Hypertensive heart disease without heart failure: Secondary | ICD-10-CM | POA: Diagnosis not present

## 2023-12-14 DIAGNOSIS — E119 Type 2 diabetes mellitus without complications: Secondary | ICD-10-CM | POA: Diagnosis not present

## 2023-12-14 DIAGNOSIS — I82509 Chronic embolism and thrombosis of unspecified deep veins of unspecified lower extremity: Secondary | ICD-10-CM | POA: Diagnosis not present

## 2023-12-19 DIAGNOSIS — E119 Type 2 diabetes mellitus without complications: Secondary | ICD-10-CM | POA: Diagnosis not present

## 2024-01-02 DIAGNOSIS — N182 Chronic kidney disease, stage 2 (mild): Secondary | ICD-10-CM | POA: Diagnosis not present

## 2024-01-02 DIAGNOSIS — G8191 Hemiplegia, unspecified affecting right dominant side: Secondary | ICD-10-CM | POA: Diagnosis not present

## 2024-01-02 DIAGNOSIS — E039 Hypothyroidism, unspecified: Secondary | ICD-10-CM | POA: Diagnosis not present

## 2024-01-02 DIAGNOSIS — I6930 Unspecified sequelae of cerebral infarction: Secondary | ICD-10-CM | POA: Diagnosis not present

## 2024-01-02 DIAGNOSIS — G4733 Obstructive sleep apnea (adult) (pediatric): Secondary | ICD-10-CM | POA: Diagnosis not present

## 2024-01-02 DIAGNOSIS — E7800 Pure hypercholesterolemia, unspecified: Secondary | ICD-10-CM | POA: Diagnosis not present

## 2024-01-02 DIAGNOSIS — E1122 Type 2 diabetes mellitus with diabetic chronic kidney disease: Secondary | ICD-10-CM | POA: Diagnosis not present

## 2024-01-02 DIAGNOSIS — I10 Essential (primary) hypertension: Secondary | ICD-10-CM | POA: Diagnosis not present

## 2024-01-05 DIAGNOSIS — I6930 Unspecified sequelae of cerebral infarction: Secondary | ICD-10-CM | POA: Diagnosis not present

## 2024-01-05 DIAGNOSIS — I10 Essential (primary) hypertension: Secondary | ICD-10-CM | POA: Diagnosis not present

## 2024-01-05 DIAGNOSIS — E039 Hypothyroidism, unspecified: Secondary | ICD-10-CM | POA: Diagnosis not present
